# Patient Record
Sex: Female | Born: 1935
Health system: Southern US, Community
[De-identification: ages and names within clinical notes are randomized; demographics above are authoritative.]

## PROBLEM LIST (undated history)

## (undated) DIAGNOSIS — E785 Hyperlipidemia, unspecified: Secondary | ICD-10-CM

## (undated) DIAGNOSIS — J310 Chronic rhinitis: Secondary | ICD-10-CM

## (undated) DIAGNOSIS — I10 Essential (primary) hypertension: Secondary | ICD-10-CM

## (undated) HISTORY — DX: Chronic rhinitis: J31.0

## (undated) HISTORY — PX: NASAL SINUS SURGERY: SHX719

## (undated) HISTORY — DX: Essential (primary) hypertension: I10

## (undated) HISTORY — PX: MANDIBLE SURGERY: SHX707

## (undated) HISTORY — DX: Hyperlipidemia, unspecified: E78.5

---

## 1998-05-04 ENCOUNTER — Ambulatory Visit (HOSPITAL_COMMUNITY): Admission: RE | Admit: 1998-05-04 | Discharge: 1998-05-04 | Payer: Self-pay | Admitting: Gastroenterology

## 1998-05-25 ENCOUNTER — Other Ambulatory Visit: Admission: RE | Admit: 1998-05-25 | Discharge: 1998-05-25 | Payer: Self-pay | Admitting: *Deleted

## 1998-12-29 ENCOUNTER — Other Ambulatory Visit: Admission: RE | Admit: 1998-12-29 | Discharge: 1998-12-29 | Payer: Self-pay | Admitting: Internal Medicine

## 2000-01-18 ENCOUNTER — Other Ambulatory Visit: Admission: RE | Admit: 2000-01-18 | Discharge: 2000-01-18 | Payer: Self-pay | Admitting: Internal Medicine

## 2001-01-23 ENCOUNTER — Other Ambulatory Visit: Admission: RE | Admit: 2001-01-23 | Discharge: 2001-01-23 | Payer: Self-pay | Admitting: *Deleted

## 2003-11-19 HISTORY — PX: VESICOVAGINAL FISTULA CLOSURE W/ TAH: SUR271

## 2005-04-14 ENCOUNTER — Encounter: Admission: RE | Admit: 2005-04-14 | Discharge: 2005-04-14 | Payer: Self-pay | Admitting: Orthopedic Surgery

## 2005-10-15 ENCOUNTER — Other Ambulatory Visit: Admission: RE | Admit: 2005-10-15 | Discharge: 2005-10-15 | Payer: Self-pay | Admitting: Internal Medicine

## 2008-09-08 ENCOUNTER — Ambulatory Visit: Payer: Self-pay | Admitting: Internal Medicine

## 2009-02-20 ENCOUNTER — Ambulatory Visit: Payer: Self-pay | Admitting: Internal Medicine

## 2009-08-25 ENCOUNTER — Ambulatory Visit: Payer: Self-pay | Admitting: Internal Medicine

## 2010-02-06 ENCOUNTER — Ambulatory Visit: Payer: Self-pay | Admitting: Internal Medicine

## 2010-02-06 DIAGNOSIS — I10 Essential (primary) hypertension: Secondary | ICD-10-CM | POA: Insufficient documentation

## 2010-02-06 DIAGNOSIS — E785 Hyperlipidemia, unspecified: Secondary | ICD-10-CM | POA: Insufficient documentation

## 2010-02-06 DIAGNOSIS — J310 Chronic rhinitis: Secondary | ICD-10-CM | POA: Insufficient documentation

## 2010-02-06 LAB — CONVERTED CEMR LAB
Basophils Absolute: 0 10*3/uL (ref 0.0–0.1)
Eosinophils Absolute: 0.6 10*3/uL (ref 0.0–0.7)
Eosinophils Relative: 9.4 % — ABNORMAL HIGH (ref 0.0–5.0)
Hemoglobin: 12.1 g/dL (ref 12.0–15.0)
MCV: 95 fL (ref 78.0–100.0)
Monocytes Absolute: 0.8 10*3/uL (ref 0.1–1.0)
Neutro Abs: 2.6 10*3/uL (ref 1.4–7.7)
Platelets: 282 10*3/uL (ref 150.0–400.0)
RBC: 3.81 M/uL — ABNORMAL LOW (ref 3.87–5.11)

## 2010-02-21 ENCOUNTER — Ambulatory Visit: Payer: Self-pay | Admitting: Internal Medicine

## 2010-02-22 ENCOUNTER — Telehealth (INDEPENDENT_AMBULATORY_CARE_PROVIDER_SITE_OTHER): Payer: Self-pay | Admitting: *Deleted

## 2010-02-23 ENCOUNTER — Ambulatory Visit: Payer: Self-pay | Admitting: Internal Medicine

## 2010-03-29 ENCOUNTER — Ambulatory Visit: Payer: Self-pay | Admitting: Internal Medicine

## 2010-04-02 ENCOUNTER — Ambulatory Visit: Payer: Self-pay | Admitting: Cardiovascular Disease

## 2010-04-10 ENCOUNTER — Telehealth (INDEPENDENT_AMBULATORY_CARE_PROVIDER_SITE_OTHER): Payer: Self-pay | Admitting: *Deleted

## 2010-05-08 ENCOUNTER — Ambulatory Visit: Payer: Self-pay | Admitting: Internal Medicine

## 2010-05-13 ENCOUNTER — Encounter: Payer: Self-pay | Admitting: Internal Medicine

## 2010-05-14 ENCOUNTER — Ambulatory Visit: Payer: Self-pay | Admitting: Internal Medicine

## 2010-05-22 ENCOUNTER — Ambulatory Visit: Payer: Self-pay | Admitting: Internal Medicine

## 2010-05-25 ENCOUNTER — Ambulatory Visit: Payer: Self-pay | Admitting: Internal Medicine

## 2010-05-28 ENCOUNTER — Ambulatory Visit: Payer: Self-pay | Admitting: Internal Medicine

## 2010-05-31 ENCOUNTER — Ambulatory Visit: Payer: Self-pay | Admitting: Internal Medicine

## 2010-06-05 ENCOUNTER — Ambulatory Visit: Payer: Self-pay | Admitting: Internal Medicine

## 2010-06-08 ENCOUNTER — Ambulatory Visit: Payer: Self-pay | Admitting: Internal Medicine

## 2010-06-18 ENCOUNTER — Ambulatory Visit: Payer: Self-pay | Admitting: Internal Medicine

## 2010-06-21 ENCOUNTER — Ambulatory Visit: Payer: Self-pay | Admitting: Internal Medicine

## 2010-06-22 ENCOUNTER — Ambulatory Visit: Payer: Self-pay | Admitting: Internal Medicine

## 2010-06-26 ENCOUNTER — Ambulatory Visit: Payer: Self-pay | Admitting: Internal Medicine

## 2010-06-29 ENCOUNTER — Ambulatory Visit: Payer: Self-pay | Admitting: Internal Medicine

## 2010-07-03 ENCOUNTER — Ambulatory Visit: Payer: Self-pay | Admitting: Internal Medicine

## 2010-07-06 ENCOUNTER — Ambulatory Visit: Payer: Self-pay | Admitting: Internal Medicine

## 2010-07-09 ENCOUNTER — Ambulatory Visit: Payer: Self-pay | Admitting: Internal Medicine

## 2010-07-11 ENCOUNTER — Ambulatory Visit: Payer: Self-pay | Admitting: Internal Medicine

## 2010-07-16 ENCOUNTER — Ambulatory Visit: Payer: Self-pay | Admitting: Internal Medicine

## 2010-07-17 ENCOUNTER — Ambulatory Visit: Payer: Self-pay | Admitting: Internal Medicine

## 2010-07-19 ENCOUNTER — Ambulatory Visit: Payer: Self-pay | Admitting: Internal Medicine

## 2010-07-24 ENCOUNTER — Ambulatory Visit: Payer: Self-pay | Admitting: Internal Medicine

## 2010-08-02 ENCOUNTER — Ambulatory Visit: Payer: Self-pay | Admitting: Internal Medicine

## 2010-08-06 ENCOUNTER — Ambulatory Visit: Payer: Self-pay | Admitting: Internal Medicine

## 2010-08-09 ENCOUNTER — Ambulatory Visit: Payer: Self-pay | Admitting: Internal Medicine

## 2010-08-14 ENCOUNTER — Ambulatory Visit: Payer: Self-pay | Admitting: Internal Medicine

## 2010-08-17 ENCOUNTER — Ambulatory Visit: Payer: Self-pay | Admitting: Internal Medicine

## 2010-08-21 ENCOUNTER — Ambulatory Visit: Payer: Self-pay | Admitting: Internal Medicine

## 2010-08-24 ENCOUNTER — Ambulatory Visit: Payer: Self-pay | Admitting: Internal Medicine

## 2010-08-27 ENCOUNTER — Ambulatory Visit: Payer: Self-pay | Admitting: Internal Medicine

## 2010-08-28 ENCOUNTER — Ambulatory Visit: Payer: Self-pay | Admitting: Internal Medicine

## 2010-09-04 ENCOUNTER — Ambulatory Visit: Payer: Self-pay | Admitting: Internal Medicine

## 2010-09-07 ENCOUNTER — Ambulatory Visit: Payer: Self-pay | Admitting: Internal Medicine

## 2010-09-10 ENCOUNTER — Ambulatory Visit: Payer: Self-pay | Admitting: Internal Medicine

## 2010-09-13 ENCOUNTER — Ambulatory Visit: Payer: Self-pay | Admitting: Internal Medicine

## 2010-09-17 ENCOUNTER — Ambulatory Visit: Payer: Self-pay | Admitting: Internal Medicine

## 2010-09-21 ENCOUNTER — Ambulatory Visit: Payer: Self-pay | Admitting: Internal Medicine

## 2010-09-27 ENCOUNTER — Ambulatory Visit: Payer: Self-pay | Admitting: Internal Medicine

## 2010-10-02 ENCOUNTER — Ambulatory Visit: Payer: Self-pay | Admitting: Internal Medicine

## 2010-10-09 ENCOUNTER — Ambulatory Visit: Payer: Self-pay | Admitting: Internal Medicine

## 2010-10-18 ENCOUNTER — Ambulatory Visit: Payer: Self-pay | Admitting: Internal Medicine

## 2010-10-25 ENCOUNTER — Ambulatory Visit: Payer: Self-pay | Admitting: Internal Medicine

## 2010-10-30 ENCOUNTER — Ambulatory Visit: Payer: Self-pay | Admitting: Internal Medicine

## 2010-10-30 ENCOUNTER — Encounter: Payer: Self-pay | Admitting: Internal Medicine

## 2010-11-13 ENCOUNTER — Ambulatory Visit: Payer: Self-pay | Admitting: Internal Medicine

## 2010-11-16 ENCOUNTER — Ambulatory Visit: Payer: Self-pay | Admitting: Internal Medicine

## 2010-12-03 ENCOUNTER — Ambulatory Visit: Payer: Self-pay | Admitting: Internal Medicine

## 2010-12-06 ENCOUNTER — Ambulatory Visit: Payer: Self-pay | Admitting: Internal Medicine

## 2010-12-11 ENCOUNTER — Ambulatory Visit: Payer: Self-pay | Admitting: Internal Medicine

## 2010-12-13 ENCOUNTER — Ambulatory Visit: Payer: Self-pay | Admitting: Internal Medicine

## 2010-12-18 ENCOUNTER — Ambulatory Visit: Payer: Self-pay | Admitting: Internal Medicine

## 2010-12-18 NOTE — Progress Notes (Signed)
Summary: results  Phone Note Call from Patient Call back at [phone] or [phone]   Caller: Patient Call For: young Summary of Call: calling for ct results Initial call taken by: Rickard Patience,  Apr 10, 2010 9:02 AM  Follow-up for Phone Call        called and spoke with pt.  pt aware of CT results.  pt states she is already using neti pot but will also pick up mucinex as well.  pt requested [facility] [place], [place] as she is at [employer] this week.  RX sent to pharmacy.  Megan Reynolds LPN  Apr 10, 2010 9:08 AM     New/Updated Medications: BIAXIN 500 MG TABS (CLARITHROMYCIN) Take 1 tablet by mouth two times a day for 10 days Prescriptions: BIAXIN 500 MG TABS (CLARITHROMYCIN) Take 1 tablet by mouth two times a day for 10 days  #20 x 0   Entered by:   Arman Filter LPN   Authorized by:   Waymon Budge MD   Signed by:   Arman Filter LPN on 84/69/6295   Method used:   Telephoned to ...       [facility] [address] (retail)       [address]       [place], [place]  [address]       Ph: [phone]       Fax: [phone]   RxID:   [id number]  Rx was not called into [facility] in [place].  [facility] [place], [place] [phone] Arman Filter LPN  Apr 10, 2010 9:22 AM

## 2010-12-18 NOTE — Miscellaneous (Signed)
Summary: Skin Test/[facility]  Skin Test/[facility]   Imported By: Sherian Rein 02/28/2010 10:15:29  _____________________________________________________________________  External Attachment:    Type:   Image     Comment:   External Document

## 2010-12-18 NOTE — Miscellaneous (Signed)
Summary: Injection Record / [facility]    Injection Record / [facility]    Imported By: Lennie Odor 09/25/2010 14:18:23  _____________________________________________________________________  External Attachment:    Type:   Image     Comment:   External Document

## 2010-12-18 NOTE — Assessment & Plan Note (Signed)
Summary: rov 1 month ///kp   Copy to:  Dr. Sandrea Hughs Primary Provider/Referring Provider:  Dr. Lenord Fellers  CC:  1 month follow up visit-allergies.  History of Present Illness: 2010/03/03- 81 yoF referred by Dr Sherene Sires (who had taken care of her husband) for allergy assessment, complaining of chronic nasal congestion. She remembers allergy treatment by Dr Rema Jasmine in the 1960's, when she was on allergy vaccine. She thought treatment helped, but her next allergist didn't think she needed shots. She cites triggers including weather changes and strong odors, dust and mold, but says symptoms are perennial, worst in Spring and Fall. Typically, in the last 2 weeks she has had increased nasal congestion, frontal and retroorbital headache, morning cough with clear mucus. She has little sneeze, itch or wheeze. Last antibiotic was in Oct, 2010. Squeeze bottle saline has helped a lot. Flonase and antihistamines have helped some. After a CT sinus she was sent to an ENT for discussion of surgery which she doesn't want. Doesn't like nasal sprays. Up to date on Flu vax, Pneumovax.  February 21, 2010- Allergic rhinitis Aware of rain and pollens affecting her nasal congestion in the past week off antihistamines. She reports remote hx of CT sinus at [facility]- we will try to get that report. CBC- EOS increased 9.4% IgE- 101.5 RAST- Neg Skin test- grass, weed, tree, endodermals, molds  Mar 29, 2010- Allergic rhinitis She says she has been very uncomfortable with nasal and sinus congestion since here in March. We reviewed her allergy skin tests which correspond seasonally. She hasn't liked her experience with nasal sprays. She has been using Claritin-D. Feels blocked with little drainage. Also has occipital neck pain with movement. She compains of occipital pain from arching neck to breathe at night. Ear feels stopped up. She says now that previous CT sinus was in 1995. Goes to beach regularly in summer  and concerned how  that would work if she wanted to try allergy shots again.     Current Medications (verified): 1)  Claritin-D 12 Hour 5-120 Mg Xr12h-Tab (Loratadine-Pseudoephedrine) .... Once Daily 2)  Amlodipine Besylate 5 Mg Tabs (Amlodipine Besylate) .... Once Daily 3)  Simvastatin 10 Mg Tabs (Simvastatin) .... 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  Quit in 1980's.  < 1/2 ppd x 10 yrs  Risk Factors: Smoking Status: quit (Mar 03, 2010)  Review of Systems      See HPI       The patient complains of nasal congestion/difficulty breathing through nose.  The patient denies shortness of breath with activity, shortness of breath at rest, productive cough, non-productive cough, coughing up blood, chest pain, irregular heartbeats, acid heartburn, indigestion, loss of appetite, weight change, abdominal pain, difficulty swallowing, sore throat, tooth/dental problems, and headaches.    Vital Signs:  Patient profile:   77 year old female Height:      61 inches Weight:      120 pounds BMI:     22.76 O2 Sat:      95 % on Room air Pulse rate:   90 /  minute BP sitting:   122 / 74  (left arm) Cuff size:   regular  Vitals Entered By: Reynaldo Minium CMA (Mar 29, 2010 2:48 PM)  O2 Flow:  Room air  Physical Exam  Additional Exam:  General: A/Ox3; pleasant and cooperative, NAD, very sharp, elderly, slender SKIN: no rash, lesions NODES: no lymphadenopathy HEENT: [place]/AT,  EOM- WNL, Conjuctivae- clear, periorbital puffiness, PERRLA, TM-WNL, Nose- clear, somewhat atrophic, Throat- clear and wnl, Mallampati  II NECK: Supple w/ fair ROM, JVD- none, normal carotid impulses w/o bruits Thyroid-  CHEST: Clear to P&A HEART: RRR, no m/g/r heard ABDOMEN: Soft and nl;  IHK:VQQV, nl pulses, no edema  NEURO: Grossly intact to observation      Impression & Recommendations:  Problem # 1:  RHINITIS, CHRONIC (ICD-472.0)  Perennial nasal congestion at night. We will CT sinuses since her last one was 11 years ago. She doesn't remember now that surgery was ever a consideration. We will give neb and depo today so that she can see if there is any response to medication. Her occipital headaches may come from arching her neck at night trying to breathe through her nose.  Other Orders: Est. Patient Level III (95638) Radiology Referral (Radiology) Admin of Therapeutic Inj  intramuscular or subcutaneous (75643) Depo- Medrol 80mg  (J1040) Nebulizer Tx (32951)  Patient Instructions: 1)  Please schedule a follow-up appointment in 1 month. 2)  Neb neo nasal  3)  Depo 80 4)  Let's see what effect this treatment today has, and how you feel as we get through the last of the Spring pollen season. 5)  That and the results of the CT scan can help [place] decide if medication or allergy shots can offer anything beyond what you are already doing. 6)  A Limited Sinus CT has been recommended.  Your imaging study may require preauthorization.      Medication Administration  Injection # 1:    Medication: Depo- Medrol 80mg     Diagnosis: RHINITIS, CHRONIC (ICD-472.0)    Route: SQ    Site: RUOQ gluteus    Exp Date: 09/2012    Lot #: [id number]    Mfr: Pharmacia    Patient tolerated injection without complications    Given by: Reynaldo Minium CMA (Mar 29, 2010 5:22 PM)  Medication # 1:    Medication: EMR miscellaneous medications    Diagnosis: RHINITIS, CHRONIC (ICD-472.0)    Dose: 3 drops     Route: intranasal    Exp Date: 08/2011    Lot #: [id number]    Mfr: bayer    Comments: Neo-Synephrine.    Patient tolerated medication without complications    Given by: Reynaldo Minium CMA (Mar 29, 2010 5:22 PM)  Orders Added: 1)  Est. Patient Level III [63016] 2)  Radiology Referral [Radiology] 3)  Admin of Therapeutic Inj  intramuscular or subcutaneous [96372] 4)  Depo- Medrol 80mg  [J1040] 5)  Nebulizer Tx [01093]

## 2010-12-18 NOTE — Miscellaneous (Signed)
Summary: Injection Record / [facility]    Injection Record / [facility]    Imported By: Lennie Odor 07/20/2010 11:37:40  _____________________________________________________________________  External Attachment:    Type:   Image     Comment:   External Document

## 2010-12-18 NOTE — Progress Notes (Signed)
Summary: copy  Phone Note Call from Patient   Caller: Patient Call For: young Summary of Call: pt wants a copy of her allergy skin test sent to her. Initial call taken by: Eugene Gavia,  February 22, 2010 8:33 AM  Follow-up for Phone Call        Please inform pt that once the note has been signed off on and results are available i will mail her a copy. I have flagged myself as a reminder to do so. thanks. Reynaldo Minium CMA  February 22, 2010 2:56 PM   Additional Follow-up for Phone Call Additional follow up Details #1::        Spoke with pt and made aware of the above recs per [facility]. Additional Follow-up by: Vernie Murders,  February 22, 2010 2:58 PM

## 2010-12-18 NOTE — Miscellaneous (Signed)
Summary: Injection Orders / [facility]    Injection Orders /  Allergy    Imported By: Lennie Odor 05/22/2010 11:09:20  _____________________________________________________________________  External Attachment:    Type:   Image     Comment:   External Document

## 2010-12-18 NOTE — Miscellaneous (Signed)
Summary: Injection [facility]   Imported By: Sherian Rein 10/10/2010 14:17:12  _____________________________________________________________________  External Attachment:    Type:   Image     Comment:   External Document

## 2010-12-18 NOTE — Assessment & Plan Note (Signed)
Summary: rov 2 months///kp   Copy to:  Dr. Sandrea Hughs Primary Provider/Referring Provider:  Dr. Lenord Fellers  CC:  2 month follow up visit-allergies.C/O congestion(nasal), headaches, stiffness in neck, blood from nose, and Right ear stopped up.Marland Kitchen  History of Present Illness: Mar 29, 2010- Allergic rhinitis She says she has been very uncomfortable with nasal and sinus congestion since here in March. We reviewed her allergy skin tests which correspond seasonally. She hasn't liked her experience with nasal sprays. She has been using Claritin-D. Feels blocked with little drainage. Also has occipital neck pain with movement. She compains of occipital pain from arching neck to breathe at night. Ear feels stopped up. She says now that previous CT sinus was in 1995. Goes to beach regularly in summer  and concerned how that would work if she wanted to try allergy shots again.  May 08, 2010- Allergic rhinitis, rhinosinusitis, pruritus After last here, CT sinus showed bubbly opacification c/w acute sinusitis and we had her take biaxin x 10 days with mucinex and Neti pot. She feels much better since then- essentially well now. Denies headache, blowing or draining now. Still stops up in nose lying down at night. Chest is comfortable.  New problem of patchy pruritic rash, mostly not visible, x 1 week We discussed possile timing related to biaxin.She asks about starting alergy vaccine based on skin testing 02/21/10.  July 11, 2010- Allergic rhinitis, rhinosinusitis, pruitus Getting headaches at back of neck/ occipital. Trying heat for these. Building allergy vacine here.  Right ear stopped up. Nasal congestion persists. Small blood from nose in mucus. Using Clarinex-d but c/o not helping. Uses Neti pot.   Preventive Screening-Counseling & Management  Alcohol-Tobacco     Smoking Status: quit > 6 months     Year Started: 1979     Year Quit: 1984     Tobacco Counseling: not indicated; no tobacco use  Current  Medications (verified): 1)  Claritin-D 12 Hour 5-120 Mg Xr12h-Tab (Loratadine-Pseudoephedrine) .... Once Daily 2)  Amlodipine Besylate 5 Mg Tabs (Amlodipine Besylate) .... Once Daily 3)  Simvastatin 10 Mg Tabs (Simvastatin) .... 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  Quit in 1980's.  < 1/2 ppd x 10 yrs  Risk Factors: Smoking Status: quit > 6 months (07/11/2010)  Review of Systems      See HPI       The patient complains of headaches, nasal congestion/difficulty breathing through nose, and ear ache.  The patient denies shortness of breath with activity, shortness of breath at rest, productive cough, non-productive cough, coughing up blood, chest pain, irregular heartbeats, acid heartburn, indigestion, loss of appetite, weight change, abdominal pain, difficulty swallowing, sore throat, tooth/dental problems, sneezing, rash, change in color of mucus, and fever.    Vital Signs:  Patient profile:   77 year old female Height:      61 inches Weight:      116.13 pounds BMI:     22.02 O2 Sat:      97 % on Room air Pulse rate:  78 / minute BP sitting:   132 / 74  (left arm) Cuff size:   regular  Vitals Entered By: Reynaldo Minium CMA (July 11, 2010 11:03 AM)  O2 Flow:  Room air CC: 2 month follow up visit-allergies.C/O congestion(nasal),headaches,stiffness in neck, blood from nose, Right ear stopped up.   Physical  Exam  Additional Exam:  General: A/Ox3; pleasant and cooperative, NAD, very sharp, elderly, slender SKIN: no rash, lesions NODES: no lymphadenopathy HEENT: [place]/AT, EOM- WNL, Conjuctivae- clear, mild periorbital puffiness, PERRLA, TM-WNL.Marland Kitchen Specifically the right side is normal or very minimally retracted, Nose- clear, narrow. Mucosa is not inflammed or edematous.,  mucosa somewhat atrophic, Throat- clear and wnl, Mallampati  II NECK: Supple w/ fair ROM, JVD- none, normal carotid impulses w/o bruits Thyroid-  CHEST: Clear to P&A HEART: RRR, no m/g/r heard ABDOMEN: Soft and nl;  ZOX:WRUE, nl pulses, no edema  NEURO: Grossly intact to observation      Impression & Recommendations:  Problem # 1:  HEADACHE, CHRONIC (ICD-784.0)  She is describing occipital headache most consitent with muscle tension and cervical arthritis. I explained this is not likely related to her nasal/ sinus complaints.  Problem # 2:  RHINITIS, CHRONIC (ICD-472.0)  She attributes current symptoms to the hot humid summer. That air quality problem is beginning to ease. She will continue building allergy vaccine. We will try mucinex-D instead of antihistamines, and try Nasalcrom nasal spray.  Medications Added to Medication List This Visit: 1)  Allergy Vaccine Building Gh  2)  Nasalcrom 5.2 Mg/act Aers (Cromolyn sodium) .Marland Kitchen.. 1-2 sprays each nostril four times a day 3)  Mucinex D 60-600 Mg Xr12h-tab (Pseudoephedrine-guaifenesin) .Marland Kitchen.. 1 two times a day as needed  Other Orders: Est. Patient Level III (45409)  Patient Instructions: 1)  Please schedule a follow-up appointment in 4 months. 2)  Continue building allergy vaccine. We will probably increase the strength next office visit. 3)  Try otc Mucinex-D 60/600, 1-2 twice daily if needed for your nose. 4)  Try otc nasalcrom. cromolyn nasal spray.

## 2010-12-18 NOTE — Assessment & Plan Note (Signed)
Summary: ROV 1 MONTH///KP   Copy to:  Dr. Sandrea Hughs Primary Provider/Referring Provider:  Dr. Lenord Fellers  CC:  1 month followup- rhinitis.  History of Present Illness: February 21, 2010- Allergic rhinitis Aware of rain and pollens affecting her nasal congestion in the past week off antihistamines. She reports remote hx of CT sinus at [facility]- we will try to get that report. CBC- EOS increased 9.4% IgE- 101.5 RAST- Neg Skin test- grass, weed, tree, endodermals, molds  Mar 29, 2010- Allergic rhinitis She says she has been very uncomfortable with nasal and sinus congestion since here in March. We reviewed her allergy skin tests which correspond seasonally. She hasn't liked her experience with nasal sprays. She has been using Claritin-D. Feels blocked with little drainage. Also has occipital neck pain with movement. She compains of occipital pain from arching neck to breathe at night. Ear feels stopped up. She says now that previous CT sinus was in 1995. Goes to beach regularly in summer  and concerned how that would work if she wanted to try allergy shots again.  May 08, 2010- Allergic rhinitis, rhinosinusitis, pruritus After last here, CT sinus showed bubbly opacification c/w acute sinusitis and we had her take biaxin x 10 days with mucinex and Neti pot. She feels much better since then- essentially well now. Denies headache, blowing or draining now. Still stops up in nose lying down at night. Chest is comfortable.  New problem of patchy pruritic rash, mostly not visible, x 1 week We discussed possile timing related to biaxin.She asks about starting alergy vaccine based on skin testing 02/21/10.     Preventive Screening-Counseling & Management  Alcohol-Tobacco     Smoking Status: quit > 6 months  Current Medications (verified): 1)  Claritin-D 12 Hour 5-120 Mg Xr12h-Tab (Loratadine-Pseudoephedrine) .... Once Daily 2)  Amlodipine Besylate 5 Mg Tabs (Amlodipine Besylate) .... Once Daily 3)   Simvastatin 10 Mg Tabs (Simvastatin) .... 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  Quit in 1980's.  < 1/2 ppd x 10 yrs  Risk Factors: Smoking Status: quit > 6 months (05/08/2010)  Social History: Smoking Status:  quit > 6 months  Review of Systems      See HPI       The patient complains of nasal congestion/difficulty breathing through nose and sneezing.  The patient denies shortness of breath with activity, shortness of breath at rest, productive cough, non-productive cough, coughing up blood, chest pain, irregular heartbeats, acid heartburn, indigestion, loss of appetite, weight change, abdominal pain, difficulty swallowing, sore throat, tooth/dental problems, and headaches.    Vital Signs:  Patient profile:   77 year old female Height:      61 inches Weight:      119 pounds BMI:     22.57 O2 Sat:      95 % on Room air Pulse rate:   94 / minute BP sitting:   110 / 64  (left arm) Cuff size:   regular  Vitals Entered By: Kandice Hams (May 08, 2010 2:53 PM)  O2 Flow:  Room air  Physical Exam  Additional Exam:  General: A/Ox3; pleasant and cooperative, NAD, very sharp, elderly, slender SKIN: no rash, lesions NODES: no lymphadenopathy HEENT: [place]/AT, EOM- WNL, Conjuctivae- clear, mild periorbital puffiness, PERRLA, TM-WNL, Nose- clear,  mucosa somewhat atrophic, Throat- clear and wnl, Mallampati  II NECK: Supple w/ fair ROM, JVD- none, normal  carotid impulses w/o bruits Thyroid-  CHEST: Clear to P&A HEART: RRR, no m/g/r heard ABDOMEN: Soft and nl;  AVW:UJWJ, nl pulses, no edema  NEURO: Grossly intact to observation      CT of Sinus  Procedure date:  04/02/2010  Findings:      CT SINUS LTD W/O CM - [id number]   Clinical Data: 77 year old female with frontal and maxillary sinus pain.  Congestion.   CT PARANASAL SINUS LIMITED WITHOUT CONTRAST   Technique:  Multidetector CT images of the paranasal sinuses were obtained in a single plane without contrast.   Comparison: The brain MRI 04/06/2005.   Findings: Brain parenchyma not well evaluated with this technique. Orbit and facial soft tissues are grossly normal.  Scattered paranasal sinus opacification right greater than left, with opacity most pronounced in the right frontal sinus.  There is dependent low density mucosal thickening in the right maxillary sinus.  The left maxillary is clear.  There is bubbly opacity in the posterior ethmoids.  The sphenoid sinuses are largely spared.  Visualized tympanic cavities are clear. No acute osseous abnormality identified.   IMPRESSION: Right greater than left paranasal sinus opacification with bubbly opacity is compatible with acute sinusitis in the appropriate clinical setting.   Read By:  Augusto Gamble,  M.D.     Released By:  Augusto Gamble,  M.D.  _______  Impression & Recommendations:  Problem # 1:  RHINITIS, CHRONIC (ICD-472.0)  There has been an underlying atopic and possibly vasomotor rhinitis. We again looked at seasonal patterns, her past experience and treatment options. She would like to go on allergy vaccine, which we have discussed. We will work around her frequent beach trips. We reviewed risks up to and including anaphyllaxis, realistic potential for benefit, especialy respecting her age, and symptomatic options. We also discussed sinusitis vs allergy.  Problem # 2:  RHINOSINUSITIS, ACUTE (ICD-461.8)  She  has mostly resolved a secondary sinusitis complicating her allergic rhinitis. She improved after antibiotic and after clearing of pollen season. We are going to extend antibioitic a bit longer for complettion of therapy. We talked about extending the biaxin or changing. She wants to stay with the biaxin, so that if rash starts again she will know what to avoid. The following medications were removed from the medication list:    Biaxin 500 Mg Tabs (Clarithromycin) .Marland Kitchen... Take 1 tablet by mouth two times a day for 10 days Her updated medication list for this problem includes:    Claritin-d 12 Hour 5-120 Mg Xr12h-tab (Loratadine-pseudoephedrine) ..... Once daily    Clarithromycin 500 Mg Tabs (Clarithromycin) .Marland Kitchen... 1 two times a day  Problem # 3:  RHINOSINUSITIS, ACUTE (ICD-461.8)  Medications Added to Medication List This Visit: 1)  Clarithromycin 500 Mg Tabs (Clarithromycin) .Marland Kitchen.. 1 two times a day 2)  Allergy Vaccine New Start   Other Orders: Est. Patient Level IV (56213)  Patient Instructions: 1)  Please schedule a follow-up appointment in 2 months. 2)  I will have the allergy lab call you when your allergy vaccine is ready , so you can come in to get started. 3)  Script to extend antibiotic was sent to your drug store.  4)  if you start itching agin, then stop the biaxin. Prescriptions: CLARITHROMYCIN 500 MG TABS (CLARITHROMYCIN) 1 two times a day  #14 x 0   Entered and Authorized by:   Waymon Budge MD   Signed by:   Waymon Budge MD on 05/08/2010   Method used:   Electronically to        [facility] (retail)       [address]       [place], [place]  [address]       Ph: [phone]       Fax: [phone]   RxID:   [id number]    CT of Sinus  Procedure date:  04/02/2010  Findings:      CT SINUS LTD W/O CM - [id number]   Clinical Data: 77 year old female with frontal and maxillary sinus pain.  Congestion.   CT PARANASAL SINUS  LIMITED WITHOUT CONTRAST   Technique:  Multidetector CT images of the paranasal sinuses were obtained in a single plane without contrast.   Comparison: The brain MRI 04/06/2005.   Findings: Brain parenchyma not well evaluated with this technique. Orbit and facial soft tissues are grossly normal.  Scattered paranasal sinus opacification right greater than left, with opacity most pronounced in the right frontal sinus.  There is dependent low density mucosal thickening in the right maxillary sinus.  The left maxillary is clear.  There is bubbly opacity in the posterior ethmoids.  The sphenoid sinuses are largely spared.  Visualized tympanic cavities are clear. No acute osseous abnormality identified.   IMPRESSION: Right greater than left paranasal sinus opacification with bubbly opacity is compatible with acute sinusitis in the appropriate clinical setting.   Read By:  Augusto Gamble,  M.D.     Released By:  Augusto Gamble,  M.D.  _______

## 2010-12-18 NOTE — Assessment & Plan Note (Signed)
Summary: ALLERGIES///kp   Vital Signs:  Patient profile:   77 year old female Height:      61 inches Weight:      118.13 pounds BMI:     22.40 O2 Sat:      96 % on Room air Pulse rate:   89 / minute BP sitting:   128 / 76  (left arm) Cuff size:   regular  Vitals Entered By: Gweneth Dimitri RN (02/11/10 2:02 PM)  O2 Flow:  Room air CC: Allergy Consult. Comments Medications reviewed with patient Daytime contact number verified with patient. Gweneth Dimitri RN  02-11-2010 2:08 PM    Copy to:  Dr. Sandrea Hughs Primary Provider/Referring Provider:  Dr. Lenord Fellers  CC:  Allergy Consult.Marland Kitchen  History of Present Illness: 11-Feb-2010- 81 yoF referred by Dr Sherene Sires (who had taken care of her husband) for allergy assessment, complaining of chronic nasal congestion. She remembers allergy treatment by Dr Rema Jasmine in the 1960's, when she was on allergy vaccine. She thought treatment helped, but her next allergist didn't think she needed shots. She cites triggers including weather changes and strong odors, dust and mold, but says symptoms are perennial, worst in Spring and Fall. Typically, in the last 2 weeks she has had increased nasal congestion, frontal and retroorbital headache, morning cough with clear mucus. She has little sneeze, itch or wheeze. Last antibiotic was in Oct, 2010. Squeeze bottle saline has helped a lot. Flonase and antihistamines have helped some. After a CT sinus she was sent to an ENT for discussion of surgery which she doesn't want. Doesn't like nasal sprays. Up to date on Flu vax, Pneumovax.  Preventive Screening-Counseling & Management  Alcohol-Tobacco     Smoking Status: quit  Current Medications (verified): 1)  Claritin-D 12 Hour 5-120 Mg Xr12h-Tab (Loratadine-Pseudoephedrine) .... Once Daily 2)  Amlodipine Besylate 5 Mg Tabs (Amlodipine Besylate) .... Once Daily 3)  Simvastatin 10 Mg Tabs (Simvastatin) .... 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  Quit in 1980's.  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  Quit in 1980's.  < 1/2 ppd x 10 yrs  Smoking Status:  quit  Review of Systems       The patient complains of headaches, nasal congestion/difficulty breathing through nose, and change in color of mucus.  The patient denies shortness of breath with activity, shortness of breath at rest, productive cough, non-productive cough, coughing up blood, chest pain, irregular heartbeats, acid heartburn, indigestion, loss of appetite, weight change, abdominal pain, difficulty swallowing, sore throat, tooth/dental problems, sneezing, itching, ear ache, anxiety, depression, hand/feet swelling, joint stiffness or pain, rash, and fever.    Physical Exam  Additional Exam:  General: A/Ox3; pleasant and cooperative, NAD, very sharp, elderly, slender SKIN: no rash, lesions NODES: no lymphadenopathy HEENT: [place]/AT, EOM- WNL, Conjuctivae- clear, PERRLA, TM-WNL, Nose- clear, Throat- clear and wnl, Mallampati  II  NECK: Supple w/ fair ROM, JVD- none, normal carotid impulses w/o bruits Thyroid- normal to palpation CHEST: Clear to P&A HEART: RRR, no m/g/r heard ABDOMEN: Soft and nl;  JXB:JYNW, nl pulses, no edema  NEURO: Grossly intact to observation       Impression & Recommendations:  Problem # 1:  RHINITIS, CHRONIC (ICD-472.0)  She is describing an old pattern of seasonal allergic rhinitis, with subsequent development of a chronic sinusitis radiologically. Her last CT was at an ENT office and may be due for repeat. She is interested in the allergy aspect and understands envirionmental precautions. We can try to assess how active an issue this is.   Medications Added to Medication List This Visit: 1)  Claritin-d 12 Hour 5-120 Mg Xr12h-tab (Loratadine-pseudoephedrine) .... Once daily 2)  Amlodipine Besylate 5 Mg Tabs (Amlodipine besylate) .... Once daily 3)  Simvastatin 10 Mg Tabs (Simvastatin) .... Once daily  Other Orders: New Patient Level IV (29562) TLB-CBC Platelet - w/Differential (85025-CBCD) T-Allergy Profile Region II-[place], [place], [place], [place], [place] [phone]) Depo- Medrol 80mg  (J1040) Admin of Therapeutic Inj  intramuscular or subcutaneous (65784) Nebulizer Tx (69629)  Patient Instructions: 1)  Return as able for allergy skin testing. - Stop all antihistamines 3 days before skin testing, including cold and allergy meds, otc sleep and cough meds. This includes your claritin D 2)  Lab  3)  Neb neo nasal 4)  depo 80   Immunization History:  Influenza Immunization History:    Influenza:  historical (08/18/2009)    Medication Administration  Injection # 1:    Medication: Depo- Medrol 80mg     Diagnosis: RHINITIS, CHRONIC (ICD-472.0)    Route: IM    Site: LUOQ gluteus    Exp Date: 09/2012    Lot #: [id number]    Mfr: Pharmacia    Patient tolerated injection without complications    Given by: Gweneth Dimitri RN (February 06, 2010 3:17 PM)  Medication # 1:    Medication: EMR miscellaneous medications    Diagnosis: RHINITIS, CHRONIC (ICD-472.0)    Dose: 3 gtts    Route: intranasal    Exp Date: 03/2011    Lot #: [id number]    Mfr: Bayer    Comments: NeoSynephrine    Patient tolerated medication without complications    Given by: Gweneth Dimitri RN (February 06, 2010 3:18 PM)  Orders Added: 1)  New Patient Level IV [99204] 2)  TLB-CBC Platelet - w/Differential [85025-CBCD] 3)  T-Allergy Profile Region [place], [place], [place], [place], [place] [2725] 4)  Depo- Medrol 80mg  [J1040] 5)  Admin of Therapeutic Inj  intramuscular or subcutaneous [96372] 6)  Nebulizer Tx [36644]

## 2010-12-18 NOTE — Assessment & Plan Note (Signed)
Summary: allergy skin testing ///kp   Vital Signs:  Patient profile:   77 year old female Height:      61 inches Weight:      116.50 pounds BMI:     22.09 O2 Sat:      93 % on Room air Pulse rate:   83 / minute BP sitting:   142 / 76  (right arm) Cuff size:   regular  Vitals Entered By: Boone Master CNA (February 21, 2010 3:47 PM)  O2 Flow:  Room air  Copy to:  Dr. Sandrea Hughs Primary Provider/Referring Provider:  Dr. Lenord Fellers  CC:  allergy testing.  History of Present Illness: Feb 16, 2010- 81 yoF referred by Dr Sherene Sires (who had taken care of her husband) for allergy assessment, complaining of chronic nasal congestion. She remembers allergy treatment by Dr Rema Jasmine in the 1960's, when she was on allergy vaccine. She thought treatment helped, but her next allergist didn't think she needed shots. She cites triggers including weather changes and strong odors, dust and mold, but says symptoms are perennial, worst in Spring and Fall. Typically, in the last 2 weeks she has had increased nasal congestion, frontal and retroorbital headache, morning cough with clear mucus. She has little sneeze, itch or wheeze. Last antibiotic was in Oct, 2010. Squeeze bottle saline has helped a lot. Flonase and antihistamines have helped some. After a CT sinus she was sent to an ENT for discussion of surgery which she doesn't want. Doesn't like nasal sprays. Up to date on Flu vax, Pneumovax.  February 21, 2010- Allergic rhinitis Aware of rain and pollens affecting her nasal congestion in the past week off antihistamines. She reports remote hx of CT sinus at [facility]- we will try to get that report. CBC- EOS increased 9.4% IgE- 101.5 RAST- Neg Skin test- grass, weed, tree, endodermals, molds   Current Medications (verified): 1)  Claritin-D 12 Hour 5-120 Mg Xr12h-Tab (Loratadine-Pseudoephedrine) .... Once Daily 2)  Amlodipine Besylate 5 Mg Tabs (Amlodipine Besylate) .... Once Daily 3)  Simvastatin 10 Mg Tabs  (Simvastatin) .... Once Daily  Allergies (verified): 1)  Amoxicillin  Comments:  Nurse/Medical Assistant: The patient's medications and allergies were reviewed with the patient and were updated in the Medication and Allergy Lists. 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  Quit in 1980's.  < 1/2 ppd x 10 yrs  Risk Factors: Smoking Status: quit (2010-02-16)  Past Medical History: HYPERLIPIDEMIA (ICD-272.4) HEADACHE, CHRONIC (ICD-784.0)- Skin test 02/21/10 Chronic rhinitis HYPERTENSION (ICD-401.9)  Review of Systems      See HPI  The patient denies anorexia, fever, weight loss, weight gain, vision loss, decreased hearing, hoarseness, chest pain, syncope, dyspnea on exertion, peripheral edema, prolonged cough, headaches, hemoptysis, and severe indigestion/heartburn.    Physical Exam  Additional Exam:  General: A/Ox3; pleasant and cooperative, NAD, very sharp, elderly, slender SKIN: no rash, lesions NODES: no lymphadenopathy HEENT: [place]/AT, EOM- WNL, Conjuctivae- clear, PERRLA, TM-WNL, Nose- clear, somewhat atrophic, Throat- clear and wnl, Mallampati  II NECK: Supple w/ fair ROM, JVD- none, normal carotid impulses w/o bruits Thyroid-  CHEST: Clear to P&A HEART: RRR, no m/g/r heard ABDOMEN: Soft and  nl;  ZOX:WRUE, nl pulses, no edema  NEURO: Grossly intact to observation      Impression & Recommendations:  Problem # 1:  RHINITIS, CHRONIC (ICD-472.0)  Her primary discomfort has been perennial nasal congestion. Mouthbreathing, turbinate edema and polyps are not seen on today's exam. Seasonal worsening is  significant most years. She declined ENT surgery in past after CT of sinuses, suggesting there may have been some chronic sinusitis. We will seek those.  She is already on claritin-d  We discussed allergy vaccine to the extent it helped years ago, and contrasted the expectations. We can let her try a sample of Astepro, offer to refer her back to an ENT. She doesn't want to go back to Dr Annalee Genta so if we can't get the old CT report, we can do a new one.  Problem # 2:  HEADACHE, CHRONIC (ICD-784.0)  I'm not sure that her headaches are directly related to her rhinitis , but may be.Response to treatment for rhinitis would be a good assessment.  Other Orders: Est. Patient Level II (45409) Allergy Puncture Test (81191) Allergy I.D Test (47829)  Patient Instructions: 1)  Please schedule a follow-up appointment in 1 month. 2)  Sample Astepro nasal antihistamine spray: 1-2 puffs each nostril up to twice daily as needed 3)  We will try to track down your old CT report

## 2010-12-19 DIAGNOSIS — J301 Allergic rhinitis due to pollen: Secondary | ICD-10-CM

## 2010-12-20 NOTE — Miscellaneous (Signed)
Summary: Vaccine Rx  Vaccine Rx   Imported By: [address] 11/30/2010 11:18:50  _____________________________________________________________________  External Attachment:    Type:   Image     Comment:   External Document

## 2010-12-20 NOTE — Assessment & Plan Note (Signed)
Summary: 4 months/apc   Copy to:  Dr. Sandrea Hughs Primary Farhad Burleson/Referring Kelis Plasse:  Dr. Lenord Fellers  CC:  Allergic rhinitis, rhinosinusitis, and pruitus.  History of Present Illness: May 08, 2010- Allergic rhinitis, rhinosinusitis, pruritus After last here, CT sinus showed bubbly opacification c/w acute sinusitis and we had her take biaxin x 10 days with mucinex and Neti pot. She feels much better since then- essentially well now. Denies headache, blowing or draining now. Still stops up in nose lying down at night. Chest is comfortable.  New problem of patchy pruritic rash, mostly not visible, x 1 week We discussed possile timing related to biaxin.She asks about starting alergy vaccine based on skin testing 02/21/10.  July 11, 2010- Allergic rhinitis, rhinosinusitis, pruitus Getting headaches at back of neck/ occipital. Trying heat for these. Building allergy vacine here.  Right ear stopped up. Nasal congestion persists. Small blood from nose in mucus. Using Clarinex-d but c/o not helping. Uses Neti pot.  October 30, 2010- Allergic rhinitis, rhinosinusitis, pruitus Nurse-CC: Allergic rhinitis, rhinosinusitis, pruitus Allergy vaccine is now at 1:50. She says the shots are helping- having fewer headaches. Denies reactions or concerns. Denies cough, wheeze, sneeze.  She continues otc Nasalcrom and nasal strips and daily saline rinse.    Preventive Screening-Counseling & Management  Alcohol-Tobacco     Smoking Status: quit > 6 months     Year Started: 1979     Year Quit: 1984     Tobacco Counseling: not indicated; no tobacco use  Current Medications (verified): 1)  Claritin-D 12 Hour 5-120 Mg Xr12h-Tab (Loratadine-Pseudoephedrine) .... Once Daily 2)  Amlodipine Besylate 5 Mg Tabs (Amlodipine Besylate) .... Once Daily 3)  Crestor 5 Mg Tabs (Rosuvastatin Calcium) .... Take 1 By Mouth Once Daily 4)  Allergy Vaccine Building Gh 5)  Nasalcrom 5.2 Mg/act Aers (Cromolyn Sodium) .Marland Kitchen.. 1-2  Sprays Each Nostril Four Times A Day 6)  Mucinex D 60-600 Mg Xr12h-Tab (Pseudoephedrine-Guaifenesin) .Marland Kitchen.. 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  Quit in 1980's.  < 1/2 ppd x 10 yrs  Risk Factors: Smoking Status: quit > 6 months (10/30/2010)  Review of Systems      See HPI  The patient denies shortness of breath with activity, shortness of breath at rest, productive cough, non-productive cough, coughing up blood, chest pain, irregular heartbeats, acid heartburn, indigestion, loss of appetite, weight change, abdominal pain, difficulty swallowing, sore throat, tooth/dental problems, headaches, nasal congestion/difficulty breathing through nose, and sneezing.    Vital Signs:  Patient profile:   77 year old female Height:      61 inches Weight:      115.25 pounds BMI:     21.86 O2 Sat:      95 % on Room air Pulse rate:   92 / minute BP sitting:   140 / 80  (left arm) Cuff size:   regular  Vitals Entered By: Reynaldo Minium CMA (October 30, 2010 10:23 AM)  O2 Flow:  Room air CC: Allergic rhinitis, rhinosinusitis, pruitus   Physical Exam  Additional Exam:  General: A/Ox3; pleasant and cooperative, NAD, very sharp, elderly, slender SKIN: no rash, lesions NODES: no lymphadenopathy HEENT: Burna/AT, EOM- WNL, Conjuctivae- clear, mild periorbital puffiness, PERRLA, TM-WNL, Nose- clear, narrow. Mucosa is not inflammed or edematous.,   mucosa somewhat atrophic, Throat- clear and wnl, Mallampati  II NECK: Supple w/ fair ROM, JVD- none, normal carotid impulses w/o bruits Thyroid-  CHEST: Clear to P&A HEART: RRR, no m/g/r heard ABDOMEN: Soft and nl;  ZOX:WRUE, nl pulses, no edema  NEURO: Grossly intact to observation      Impression & Recommendations:  Problem # 1:  RHINITIS, CHRONIC (ICD-472.0)  She is doing well with allergy vaccine. We have discussed and will advance to 1:10 with her next order.  She is tired of feeling stuffy despite decongestants, and is interested in talking to an ENT surgeon.   Problem # 2:  HEADACHE, CHRONIC (ICD-784.0)  She relates success with allergy vaccine to reduction in headaches, suggesting there may be a nasal congestion component. She seems pleased.   Medications Added to Medication List This Visit: 1)  Crestor 5 Mg Tabs (Rosuvastatin calcium) .... Take 1 by mouth once daily 2)  Allergy Vaccine 1:10 Gh  .... Advance with next order  Other Orders: Est. Patient Level III (45409) ENT Referral (ENT)  Patient Instructions: 1)  Please schedule a follow-up appointment in 4 months. 2)  I will notify the Allergy Lab to advance your allergy vaccine to 1:10 with the next order. Please let us know if there are questions.  3)  Our [facility] will help make an ENT consultation appointment with Dr Jearld Fenton.

## 2010-12-26 DIAGNOSIS — J301 Allergic rhinitis due to pollen: Secondary | ICD-10-CM

## 2011-01-04 ENCOUNTER — Ambulatory Visit (INDEPENDENT_AMBULATORY_CARE_PROVIDER_SITE_OTHER): Payer: Medicare Other

## 2011-01-04 DIAGNOSIS — J301 Allergic rhinitis due to pollen: Secondary | ICD-10-CM

## 2011-01-09 NOTE — Miscellaneous (Signed)
Summary: Injection Financial risk analyst   Imported By: Sherian Rein 01/02/2011 15:01:41  _____________________________________________________________________  External Attachment:    Type:   Image     Comment:   External Document

## 2011-01-10 ENCOUNTER — Ambulatory Visit (INDEPENDENT_AMBULATORY_CARE_PROVIDER_SITE_OTHER): Payer: Medicare Other

## 2011-01-10 DIAGNOSIS — J301 Allergic rhinitis due to pollen: Secondary | ICD-10-CM

## 2011-01-16 ENCOUNTER — Ambulatory Visit (INDEPENDENT_AMBULATORY_CARE_PROVIDER_SITE_OTHER): Payer: Medicare Other

## 2011-01-16 DIAGNOSIS — J301 Allergic rhinitis due to pollen: Secondary | ICD-10-CM

## 2011-01-22 ENCOUNTER — Ambulatory Visit (INDEPENDENT_AMBULATORY_CARE_PROVIDER_SITE_OTHER): Payer: Medicare Other

## 2011-01-22 ENCOUNTER — Encounter: Payer: Self-pay | Admitting: Internal Medicine

## 2011-01-22 DIAGNOSIS — J301 Allergic rhinitis due to pollen: Secondary | ICD-10-CM

## 2011-01-29 NOTE — Assessment & Plan Note (Signed)
Summary: ALLERGY/CB  Nurse Visit   Allergies: 1)  ! Amoxicillin  Orders Added: 1)  Allergy Injection (1) [95115] 

## 2011-02-06 ENCOUNTER — Ambulatory Visit (INDEPENDENT_AMBULATORY_CARE_PROVIDER_SITE_OTHER): Payer: Medicare Other

## 2011-02-06 DIAGNOSIS — J301 Allergic rhinitis due to pollen: Secondary | ICD-10-CM

## 2011-02-14 ENCOUNTER — Ambulatory Visit (INDEPENDENT_AMBULATORY_CARE_PROVIDER_SITE_OTHER): Payer: Medicare Other

## 2011-02-14 DIAGNOSIS — J301 Allergic rhinitis due to pollen: Secondary | ICD-10-CM

## 2011-02-19 ENCOUNTER — Ambulatory Visit (INDEPENDENT_AMBULATORY_CARE_PROVIDER_SITE_OTHER): Payer: Medicare Other

## 2011-02-19 DIAGNOSIS — J301 Allergic rhinitis due to pollen: Secondary | ICD-10-CM

## 2011-02-26 ENCOUNTER — Encounter: Payer: Self-pay | Admitting: Internal Medicine

## 2011-02-26 ENCOUNTER — Ambulatory Visit (INDEPENDENT_AMBULATORY_CARE_PROVIDER_SITE_OTHER): Payer: Medicare Other | Admitting: Internal Medicine

## 2011-02-26 DIAGNOSIS — M549 Dorsalgia, unspecified: Secondary | ICD-10-CM

## 2011-02-26 DIAGNOSIS — J069 Acute upper respiratory infection, unspecified: Secondary | ICD-10-CM

## 2011-02-27 ENCOUNTER — Encounter: Payer: Self-pay | Admitting: Internal Medicine

## 2011-02-27 ENCOUNTER — Ambulatory Visit (INDEPENDENT_AMBULATORY_CARE_PROVIDER_SITE_OTHER): Payer: Medicare Other

## 2011-02-27 ENCOUNTER — Ambulatory Visit (INDEPENDENT_AMBULATORY_CARE_PROVIDER_SITE_OTHER): Payer: Medicare Other | Admitting: Internal Medicine

## 2011-02-27 VITALS — BP 118/70 | HR 91 | Ht 61.0 in | Wt 116.0 lb

## 2011-02-27 DIAGNOSIS — R0989 Other specified symptoms and signs involving the circulatory and respiratory systems: Secondary | ICD-10-CM

## 2011-02-27 DIAGNOSIS — J301 Allergic rhinitis due to pollen: Secondary | ICD-10-CM

## 2011-02-27 DIAGNOSIS — J309 Allergic rhinitis, unspecified: Secondary | ICD-10-CM

## 2011-02-27 DIAGNOSIS — R0609 Other forms of dyspnea: Secondary | ICD-10-CM

## 2011-02-27 DIAGNOSIS — J310 Chronic rhinitis: Secondary | ICD-10-CM

## 2011-02-27 MED ORDER — PHENYLEPHRINE HCL 1 % NA SOLN
3.0000 [drp] | Freq: Once | NASAL | Status: AC
  Administered 2011-02-27: 3 [drp] via NASAL

## 2011-02-27 MED ORDER — CEFDINIR 300 MG PO CAPS: 300.0000 mg | ORAL_CAPSULE | Freq: Two times a day (BID) | ORAL | Status: AC

## 2011-02-27 MED ORDER — METHYLPREDNISOLONE ACETATE 80 MG/ML IJ SUSP
80.0000 mg | Freq: Once | INTRAMUSCULAR | Status: AC
  Administered 2011-02-27: 80 mg via INTRAMUSCULAR

## 2011-02-27 NOTE — Progress Notes (Signed)
  Subjective:    Patient ID: Anna Ewing, female    DOB: Sep 30, 1929, 77 y.o.   MRN: [id number]  HPI 53 yoF former smoker, followed for allergic rhinitis. Last here October 30, 2010- reviewed. Allergy vaccine has been advanced successfully to 1:10, GH. Had a cold after Christmas, but no other medical events. She is now noting discomfort blamed on wind and pollen. Wind causes frontal headaches. Blows out little, some postnasal drip.  Using saline nasal rinse.  She had seen Dr Jearld Fenton who did CT sinus and dx'd sinusitis. He gave 10 days antibiotic with little help. She had to reschedule f/u with him. Dr Lenord Fellers gave Z pak yesterday.    Review of Systems See HPI Constitutional:   No weight loss, night sweats,  Fevers, chills, fatigue, lassitude. HEENT:   No headaches,  Difficulty swallowing,  Tooth/dental problems,  Sore throat,                 CV:  No chest pain,  Orthopnea, PND, swelling in lower extremities, anasarca, dizziness, palpitations  GI  No heartburn, indigestion, abdominal pain, nausea, vomiting, diarrhea, change in bowel habits, loss of appetite  Resp: No shortness of breath with exertion or at rest.  No excess mucus, no productive cough,  No non-productive cough,  No coughing up of blood.  No change in color of mucus.  No wheezing.  Skin: no rash or lesions.  GU: no dysuria, change in color of urine, no urgency or frequency.  No flank pain.  MS:  No joint pain or swelling.  No decreased range of motion.  No back pain.  Psych:  No change in mood or affect. No depression or anxiety.  No memory loss.      Objective:   Physical Exam General- Alert, Oriented, Affect-appropriate, Distress- none acute  Skin- rash-none, lesions- none, excoriation- none  Lymphadenopathy- none  Head- atraumatic  Eyes- Gross vision intact, PERRLA, conjunctivae clear secretions  Ears- Hearing - normal, canals, Tm L ,   R ,  Nose- Clear, No- Septal dev. Some mucus,  No- polyps, erosion,  perforation   Throat- Mallampati II , mucosa clear , drainage- none, tonsils- atrophic  Neck- flexible , trachea midline, no stridor , thyroid nl, carotid no bruit  Chest - symmetrical excursion , unlabored     Heart/CV- RRR , no murmur , no gallop  , no rub, nl s1 s2                     - JVD- none , edema- none, stasis changes- none, varices- none     Lung- clear to P&A, wheeze- none, cough- none , dullness-none, rub- none     Chest wall- Abd- tender-no, distended-no, bowel sounds-present, HSM- no  Br/ Gen/ Rectal- Not done, not indicated  Extrem- cyanosis- none, clubbing, none, atrophy- none, strength- nl  Neuro- grossly intact to observation         Assessment & Plan:

## 2011-02-27 NOTE — Assessment & Plan Note (Signed)
Continuing allergy vaccine. Advised to stay in behind air conditioning during peak pollen season.

## 2011-02-27 NOTE — Assessment & Plan Note (Addendum)
Chronic rhinosinusitis.  Pending her return to Dr Jearld Fenton, we will have her take 2 weeks of cefdinir after discussion of cross reactivity of this class with penicillins,.We can also give nasal neb and depo, and she will continue nasal saline rinses.

## 2011-02-27 NOTE — Patient Instructions (Addendum)
Script Cefdinir antibiotic sent to drug store  Neb neo nasal   Depo 80  Keep appointment with Dr Jearld Fenton

## 2011-03-03 ENCOUNTER — Encounter: Payer: Self-pay | Admitting: Internal Medicine

## 2011-03-08 ENCOUNTER — Ambulatory Visit (INDEPENDENT_AMBULATORY_CARE_PROVIDER_SITE_OTHER): Payer: Medicare Other

## 2011-03-08 DIAGNOSIS — J309 Allergic rhinitis, unspecified: Secondary | ICD-10-CM

## 2011-03-13 ENCOUNTER — Ambulatory Visit (INDEPENDENT_AMBULATORY_CARE_PROVIDER_SITE_OTHER): Payer: Medicare Other

## 2011-03-13 DIAGNOSIS — J309 Allergic rhinitis, unspecified: Secondary | ICD-10-CM

## 2011-03-20 ENCOUNTER — Ambulatory Visit (INDEPENDENT_AMBULATORY_CARE_PROVIDER_SITE_OTHER): Payer: Medicare Other

## 2011-03-20 DIAGNOSIS — J309 Allergic rhinitis, unspecified: Secondary | ICD-10-CM

## 2011-03-27 ENCOUNTER — Ambulatory Visit (INDEPENDENT_AMBULATORY_CARE_PROVIDER_SITE_OTHER): Payer: Medicare Other

## 2011-03-27 DIAGNOSIS — J309 Allergic rhinitis, unspecified: Secondary | ICD-10-CM

## 2011-04-02 ENCOUNTER — Ambulatory Visit (INDEPENDENT_AMBULATORY_CARE_PROVIDER_SITE_OTHER): Payer: Medicare Other

## 2011-04-02 DIAGNOSIS — J309 Allergic rhinitis, unspecified: Secondary | ICD-10-CM

## 2011-04-12 ENCOUNTER — Ambulatory Visit (INDEPENDENT_AMBULATORY_CARE_PROVIDER_SITE_OTHER): Payer: Medicare Other

## 2011-04-12 DIAGNOSIS — J309 Allergic rhinitis, unspecified: Secondary | ICD-10-CM

## 2011-04-19 ENCOUNTER — Ambulatory Visit (INDEPENDENT_AMBULATORY_CARE_PROVIDER_SITE_OTHER): Payer: Medicare Other

## 2011-04-19 DIAGNOSIS — J309 Allergic rhinitis, unspecified: Secondary | ICD-10-CM

## 2011-04-22 ENCOUNTER — Encounter: Payer: Self-pay | Admitting: Internal Medicine

## 2011-05-01 ENCOUNTER — Ambulatory Visit (INDEPENDENT_AMBULATORY_CARE_PROVIDER_SITE_OTHER): Payer: Medicare Other

## 2011-05-01 ENCOUNTER — Ambulatory Visit: Payer: Medicare Other

## 2011-05-01 DIAGNOSIS — J309 Allergic rhinitis, unspecified: Secondary | ICD-10-CM

## 2011-05-09 ENCOUNTER — Ambulatory Visit (INDEPENDENT_AMBULATORY_CARE_PROVIDER_SITE_OTHER): Payer: Medicare Other

## 2011-05-09 DIAGNOSIS — J309 Allergic rhinitis, unspecified: Secondary | ICD-10-CM

## 2011-05-13 ENCOUNTER — Ambulatory Visit (INDEPENDENT_AMBULATORY_CARE_PROVIDER_SITE_OTHER): Payer: Medicare Other

## 2011-05-13 DIAGNOSIS — J309 Allergic rhinitis, unspecified: Secondary | ICD-10-CM

## 2011-05-24 ENCOUNTER — Ambulatory Visit (INDEPENDENT_AMBULATORY_CARE_PROVIDER_SITE_OTHER): Payer: Medicare Other

## 2011-05-24 DIAGNOSIS — J309 Allergic rhinitis, unspecified: Secondary | ICD-10-CM

## 2011-05-31 ENCOUNTER — Ambulatory Visit (INDEPENDENT_AMBULATORY_CARE_PROVIDER_SITE_OTHER): Payer: Medicare Other

## 2011-05-31 DIAGNOSIS — J309 Allergic rhinitis, unspecified: Secondary | ICD-10-CM

## 2011-06-13 ENCOUNTER — Ambulatory Visit (INDEPENDENT_AMBULATORY_CARE_PROVIDER_SITE_OTHER): Payer: Medicare Other

## 2011-06-13 DIAGNOSIS — J309 Allergic rhinitis, unspecified: Secondary | ICD-10-CM

## 2011-06-18 ENCOUNTER — Ambulatory Visit (INDEPENDENT_AMBULATORY_CARE_PROVIDER_SITE_OTHER): Payer: Medicare Other

## 2011-06-18 DIAGNOSIS — J309 Allergic rhinitis, unspecified: Secondary | ICD-10-CM

## 2011-06-25 ENCOUNTER — Ambulatory Visit (INDEPENDENT_AMBULATORY_CARE_PROVIDER_SITE_OTHER): Payer: Medicare Other

## 2011-06-25 DIAGNOSIS — J309 Allergic rhinitis, unspecified: Secondary | ICD-10-CM

## 2011-07-01 ENCOUNTER — Ambulatory Visit (INDEPENDENT_AMBULATORY_CARE_PROVIDER_SITE_OTHER): Payer: Medicare Other | Admitting: Internal Medicine

## 2011-07-01 ENCOUNTER — Ambulatory Visit (INDEPENDENT_AMBULATORY_CARE_PROVIDER_SITE_OTHER): Payer: Medicare Other

## 2011-07-01 ENCOUNTER — Encounter: Payer: Self-pay | Admitting: Internal Medicine

## 2011-07-01 VITALS — BP 120/60 | HR 76 | Ht 61.0 in | Wt 114.4 lb

## 2011-07-01 DIAGNOSIS — J309 Allergic rhinitis, unspecified: Secondary | ICD-10-CM

## 2011-07-01 DIAGNOSIS — J301 Allergic rhinitis due to pollen: Secondary | ICD-10-CM

## 2011-07-01 MED ORDER — FLUTICASONE PROPIONATE 50 MCG/ACT NA SUSP: NASAL | Status: AC

## 2011-07-01 NOTE — Progress Notes (Signed)
Subjective:    Patient ID: Anna Ewing, female    DOB: 06-02-1929, 77 y.o.   MRN: [id number]  HPI    Review of Systems     Objective:   Physical Exam        Assessment & Plan:   Subjective:    Patient ID: Anna Ewing, female    DOB: [birth year], 77 y.o.   MRN: [id number]  HPI 02/27/11-82 yoF former smoker, followed for allergic rhinitis. Last here October 30, 2010- reviewed. Allergy vaccine has been advanced successfully to 1:10, GH. Had a cold after Christmas, but no other medical events. She is now noting discomfort blamed on wind and pollen. Wind causes frontal headaches. Blows out little, some postnasal drip.  Using saline nasal rinse.  She had seen Dr Jearld Fenton who did CT sinus and dx'd sinusitis. He gave 10 days antibiotic with little help. She had to reschedule f/u with him. Dr Lenord Fellers gave Z pak yesterday.   07/01/11- 82 yoF former smoker, followed for allergic rhinitis, history of nasal polyps Allergy vaccine now at 1:10 Had succesfull sinus surgery by Dr Jearld Fenton in May- removed nasal polyps and mucus. Still at night she notices some stuffiness lying down. Denies chest tightness or wheeze.  Started using nasal saline and some of her husband's flonase.   ROS: Constitutional:   No-   weight loss, night sweats, fevers, chills, fatigue, lassitude. HEENT:   No-  headaches, difficulty swallowing, tooth/dental problems, sore throat,       No-  sneezing, itching, ear ache,                        + Nasal congestion, post nasal drip,  CV:  No-   chest pain, orthopnea, PND, swelling in lower extremities, anasarca, dizziness, palpitations Resp: No-   shortness of breath with exertion or at rest.              No-   productive cough,  No non-productive cough,  No-  coughing up of blood.              No-   change in color of mucus.  No- wheezing.   Skin: No-   rash or lesions. GI:  No-   heartburn, indigestion, abdominal pain, nausea, vomiting, diarrhea,                 change in bowel  habits, loss of appetite GU: No-   dysuria, change in color of urine, no urgency or frequency.  No- flank pain. MS:  No-   joint pain or swelling.  No- decreased range of motion.  No- back pain. Neuro- grossly normal to observation, Or:  Psych:  No- change in mood or affect. No depression or anxiety.  No memory loss Objective:   Physical Exam General- Alert, Oriented, Affect-appropriate, Distress- none acute Skin- rash-none, lesions- none, excoriation- none Lymphadenopathy- none Head- atraumatic            Eyes- Gross vision intact, PERRLA, conjunctivae clear secretions            Ears- Hearing, canals            Nose- Clear, No- Septal dev, mucus,  ( No polyps seen,)  no-erosion, perforation             Throat- Mallampati II , mucosa clear , drainage- none, tonsils- atrophic    Own teeth Neck- flexible , trachea midline, no stridor , thyroid nl, carotid  no bruit Chest - symmetrical excursion , unlabored           Heart/CV- RRR , no murmur , no gallop  , no rub, nl s1 s2                           - JVD- none , edema- none, stasis changes- none, varices- none           Lung- clear to P&A, wheeze- none, cough- none , dullness-none, rub- none           Chest wall-  Abd- tender-no, distended-no, bowel sounds-present, HSM- no Br/ Gen/ Rectal- Not done, not indicated Extrem- cyanosis- none, clubbing, none, atrophy- none, strength- nl Neuro- grossly intact to observation          Assessment & Plan:

## 2011-07-01 NOTE — Assessment & Plan Note (Addendum)
She seems to felt markedly better shortly after her nasal surgery, but may be getting worse now. Not yet clear if her increased awareness of nasal stuffiness is seasonal. I don't see recurrent polyps.  We will have her continue allergy vaccine and use the flonase regularly.

## 2011-07-01 NOTE — Patient Instructions (Signed)
Continue allergy vaccine  Use the fluticasone/ flonase   Every day    1-2 sprays into each nostril at bedtime.

## 2011-07-11 ENCOUNTER — Ambulatory Visit (INDEPENDENT_AMBULATORY_CARE_PROVIDER_SITE_OTHER): Payer: Medicare Other

## 2011-07-11 DIAGNOSIS — J309 Allergic rhinitis, unspecified: Secondary | ICD-10-CM

## 2011-07-16 ENCOUNTER — Ambulatory Visit (INDEPENDENT_AMBULATORY_CARE_PROVIDER_SITE_OTHER): Payer: Medicare Other

## 2011-07-16 DIAGNOSIS — J309 Allergic rhinitis, unspecified: Secondary | ICD-10-CM

## 2011-07-19 ENCOUNTER — Encounter: Payer: Self-pay | Admitting: Internal Medicine

## 2011-07-23 ENCOUNTER — Ambulatory Visit (INDEPENDENT_AMBULATORY_CARE_PROVIDER_SITE_OTHER): Payer: Medicare Other

## 2011-07-23 DIAGNOSIS — J309 Allergic rhinitis, unspecified: Secondary | ICD-10-CM

## 2011-08-07 ENCOUNTER — Ambulatory Visit (INDEPENDENT_AMBULATORY_CARE_PROVIDER_SITE_OTHER): Payer: Medicare Other

## 2011-08-07 DIAGNOSIS — J309 Allergic rhinitis, unspecified: Secondary | ICD-10-CM

## 2011-08-14 ENCOUNTER — Ambulatory Visit (INDEPENDENT_AMBULATORY_CARE_PROVIDER_SITE_OTHER): Payer: Medicare Other

## 2011-08-14 DIAGNOSIS — J309 Allergic rhinitis, unspecified: Secondary | ICD-10-CM

## 2011-08-22 ENCOUNTER — Ambulatory Visit (INDEPENDENT_AMBULATORY_CARE_PROVIDER_SITE_OTHER): Payer: Medicare Other

## 2011-08-22 DIAGNOSIS — J309 Allergic rhinitis, unspecified: Secondary | ICD-10-CM

## 2011-08-23 ENCOUNTER — Encounter: Payer: Self-pay | Admitting: Internal Medicine

## 2011-08-27 ENCOUNTER — Other Ambulatory Visit: Payer: Medicare Other | Admitting: Internal Medicine

## 2011-08-27 DIAGNOSIS — Z0000 Encounter for general adult medical examination without abnormal findings: Secondary | ICD-10-CM

## 2011-08-27 LAB — VITAMIN D 25 HYDROXY (VIT D DEFICIENCY, FRACTURES): Vit D, 25-Hydroxy: 37 ng/mL (ref 30–89)

## 2011-08-27 LAB — LIPID PANEL
Cholesterol: 166 mg/dL (ref 0–200)
HDL: 60 mg/dL (ref >39)
Triglycerides: 103 mg/dL (ref <150)
VLDL: 21 mg/dL (ref 0–40)

## 2011-08-27 LAB — CBC WITH DIFFERENTIAL/PLATELET
Basophils Relative: 0 % (ref 0–1)
HCT: 37.8 % (ref 36.0–46.0)
Hemoglobin: 12 g/dL (ref 12.0–15.0)
Lymphocytes Relative: 36 % (ref 12–46)
Monocytes Relative: 10 % (ref 3–12)
Neutro Abs: 2.9 10*3/uL (ref 1.7–7.7)
Neutrophils Relative %: 47 % (ref 43–77)
RDW: 14 % (ref 11.5–15.5)

## 2011-08-27 LAB — HEPATIC FUNCTION PANEL
ALT: 16 U/L (ref 0–35)
AST: 26 U/L (ref 0–37)
Albumin: 4.4 g/dL (ref 3.5–5.2)
Alkaline Phosphatase: 43 U/L (ref 39–117)
Total Protein: 7.1 g/dL (ref 6.0–8.3)

## 2011-08-27 LAB — TSH: TSH: 1.481 u[IU]/mL (ref 0.350–4.500)

## 2011-08-27 LAB — BASIC METABOLIC PANEL
CO2: 28 mEq/L (ref 19–32)
Calcium: 9.4 mg/dL (ref 8.4–10.5)
Glucose, Bld: 100 mg/dL — ABNORMAL HIGH (ref 70–99)

## 2011-08-29 ENCOUNTER — Ambulatory Visit (INDEPENDENT_AMBULATORY_CARE_PROVIDER_SITE_OTHER): Payer: Medicare Other | Admitting: Internal Medicine

## 2011-08-29 ENCOUNTER — Encounter: Payer: Self-pay | Admitting: Internal Medicine

## 2011-08-29 ENCOUNTER — Ambulatory Visit (INDEPENDENT_AMBULATORY_CARE_PROVIDER_SITE_OTHER): Payer: Medicare Other

## 2011-08-29 VITALS — BP 122/70 | HR 80 | Temp 98.2°F | Ht 61.0 in | Wt 118.0 lb

## 2011-08-29 DIAGNOSIS — K589 Irritable bowel syndrome without diarrhea: Secondary | ICD-10-CM

## 2011-08-29 DIAGNOSIS — G43909 Migraine, unspecified, not intractable, without status migrainosus: Secondary | ICD-10-CM

## 2011-08-29 DIAGNOSIS — R7689 Other specified abnormal immunological findings in serum: Secondary | ICD-10-CM

## 2011-08-29 DIAGNOSIS — Z0000 Encounter for general adult medical examination without abnormal findings: Secondary | ICD-10-CM

## 2011-08-29 DIAGNOSIS — E785 Hyperlipidemia, unspecified: Secondary | ICD-10-CM

## 2011-08-29 DIAGNOSIS — I10 Essential (primary) hypertension: Secondary | ICD-10-CM

## 2011-08-29 DIAGNOSIS — Z23 Encounter for immunization: Secondary | ICD-10-CM

## 2011-08-29 DIAGNOSIS — R768 Other specified abnormal immunological findings in serum: Secondary | ICD-10-CM

## 2011-08-29 DIAGNOSIS — J309 Allergic rhinitis, unspecified: Secondary | ICD-10-CM

## 2011-08-29 MED ORDER — AMLODIPINE BESYLATE 5 MG PO TABS: 5.0000 mg | ORAL_TABLET | Freq: Every day | ORAL | Status: DC

## 2011-08-29 MED ORDER — ROSUVASTATIN CALCIUM 5 MG PO TABS: 5.0000 mg | ORAL_TABLET | Freq: Every day | ORAL | Status: DC

## 2011-09-10 ENCOUNTER — Ambulatory Visit (INDEPENDENT_AMBULATORY_CARE_PROVIDER_SITE_OTHER): Payer: Medicare Other

## 2011-09-10 DIAGNOSIS — J309 Allergic rhinitis, unspecified: Secondary | ICD-10-CM

## 2011-09-19 ENCOUNTER — Ambulatory Visit (INDEPENDENT_AMBULATORY_CARE_PROVIDER_SITE_OTHER): Payer: Medicare Other

## 2011-09-19 DIAGNOSIS — J309 Allergic rhinitis, unspecified: Secondary | ICD-10-CM

## 2011-09-22 DIAGNOSIS — K589 Irritable bowel syndrome without diarrhea: Secondary | ICD-10-CM | POA: Insufficient documentation

## 2011-09-22 DIAGNOSIS — M810 Age-related osteoporosis without current pathological fracture: Secondary | ICD-10-CM | POA: Insufficient documentation

## 2011-09-22 NOTE — Patient Instructions (Signed)
Continue same medications. Return in 6 months for office visit , blood pressure check & hemoglobin A 1C

## 2011-09-22 NOTE — Progress Notes (Signed)
  Subjective:    Patient ID: Anna Ewing, female    DOB: [birth year], 77 y.o.   MRN: [id number]  HPI pleasant 77 year old white female a patient in this practice since 1988. History of migraine headache, allergic rhinitis, diverticulosis, irritable bowel syndrome, osteoporosis, positive ANA diagnosed 2002 with titer 1:640 homogeneous pattern. Anti-DNA was negative. Saw rheumatologist Dr. Jimmy Footman who felt she did not have significant rheumatology issue. She is here for health maintenance and evaluation of medical problems. History of hypertension treated with Norvasc. History of diabetes mellitus treated with diet. Patient had recent sinus surgery May 2012. History of total abdominal hysterectomy bilateral salpingo-oophorectomy . History of Colpopexy for uterine prolapse, cystocele and rectocele at  [facility] in 2002. Had Pneumovax 2004, gets annual influenza immunization. Tetanus immunization 2003. Colonoscopy by Dr. Kirstie Peri need 2009 showing diverticulosis.  Social history she is a retired Haematologist. Husband is retired from [employer]. Patient does not smoke. Rare alcohol consumption. Completed high school. 2 adult children a daughter and a son. One sister. Family history: Father died at age 60 with congestive heart failure, mother died at age 6 with history of breast cancer and congestive heart failure    Review of Systems  Constitutional: Negative.   HENT: Negative.   Eyes: Negative.   Cardiovascular: Negative.   Gastrointestinal: Negative.   Genitourinary: Negative.   Musculoskeletal: Negative.   Neurological: Negative.   Hematological: Negative.   Psychiatric/Behavioral: Negative.        Objective:   Physical Exam  Vitals reviewed. Constitutional: She appears well-developed and well-nourished. No distress.  HENT:  Head: Normocephalic and atraumatic.  Left Ear: External ear normal.  Mouth/Throat: Oropharynx is clear and moist.  Eyes: Conjunctivae and EOM are normal. Pupils are  equal, round, and reactive to light. No scleral icterus.  Neck: Neck supple. No JVD present. No thyromegaly present.  Cardiovascular: Normal rate, regular rhythm and normal heart sounds.   No murmur heard. Pulmonary/Chest: Effort normal and breath sounds normal. She has no wheezes.       Breasts normal female  Abdominal: She exhibits no mass. There is no tenderness. There is no guarding.  Genitourinary:       Deferred  Musculoskeletal: She exhibits no edema.  Lymphadenopathy:    Cervical adenopathy: status post sinus surgery May 2012OsteoporosisHyperlipidemiaHypertension.  Neurological: She has normal reflexes. No cranial nerve deficit. Coordination normal.  Skin: Skin is warm and dry.  Psychiatric: She has a normal mood and affect. Her behavior is normal. Judgment normal.          Assessment & Plan:  History of migraine headache  History of chronic sinusitis  Allergic rhinitis  Diverticulosis  Irritable bowel syndrome  Hyperlipidemia  Hypertension  Diabetes mellitus  Plan fasting labs drawn and are pending. Return in 6 months.

## 2011-09-23 ENCOUNTER — Ambulatory Visit (INDEPENDENT_AMBULATORY_CARE_PROVIDER_SITE_OTHER): Payer: Medicare Other

## 2011-09-23 DIAGNOSIS — J309 Allergic rhinitis, unspecified: Secondary | ICD-10-CM

## 2011-09-25 ENCOUNTER — Encounter: Payer: Self-pay | Admitting: Internal Medicine

## 2011-09-27 ENCOUNTER — Encounter: Payer: Self-pay | Admitting: Internal Medicine

## 2011-10-03 ENCOUNTER — Ambulatory Visit (INDEPENDENT_AMBULATORY_CARE_PROVIDER_SITE_OTHER): Payer: Medicare Other

## 2011-10-03 DIAGNOSIS — J309 Allergic rhinitis, unspecified: Secondary | ICD-10-CM

## 2011-10-09 ENCOUNTER — Ambulatory Visit (INDEPENDENT_AMBULATORY_CARE_PROVIDER_SITE_OTHER): Payer: Medicare Other

## 2011-10-09 DIAGNOSIS — J309 Allergic rhinitis, unspecified: Secondary | ICD-10-CM

## 2011-10-16 ENCOUNTER — Ambulatory Visit (INDEPENDENT_AMBULATORY_CARE_PROVIDER_SITE_OTHER): Payer: Medicare Other

## 2011-10-16 DIAGNOSIS — J309 Allergic rhinitis, unspecified: Secondary | ICD-10-CM

## 2011-10-23 ENCOUNTER — Ambulatory Visit (INDEPENDENT_AMBULATORY_CARE_PROVIDER_SITE_OTHER): Payer: Medicare Other

## 2011-10-23 DIAGNOSIS — J309 Allergic rhinitis, unspecified: Secondary | ICD-10-CM

## 2011-10-31 ENCOUNTER — Ambulatory Visit (INDEPENDENT_AMBULATORY_CARE_PROVIDER_SITE_OTHER): Payer: Medicare Other

## 2011-10-31 DIAGNOSIS — J309 Allergic rhinitis, unspecified: Secondary | ICD-10-CM

## 2011-11-04 ENCOUNTER — Encounter: Payer: Self-pay | Admitting: Internal Medicine

## 2011-11-08 ENCOUNTER — Ambulatory Visit (INDEPENDENT_AMBULATORY_CARE_PROVIDER_SITE_OTHER): Payer: Medicare Other

## 2011-11-08 DIAGNOSIS — J309 Allergic rhinitis, unspecified: Secondary | ICD-10-CM

## 2011-11-14 ENCOUNTER — Ambulatory Visit (INDEPENDENT_AMBULATORY_CARE_PROVIDER_SITE_OTHER): Payer: Medicare Other

## 2011-11-14 DIAGNOSIS — J309 Allergic rhinitis, unspecified: Secondary | ICD-10-CM

## 2011-11-21 ENCOUNTER — Ambulatory Visit (INDEPENDENT_AMBULATORY_CARE_PROVIDER_SITE_OTHER): Payer: Medicare Other

## 2011-11-21 DIAGNOSIS — J309 Allergic rhinitis, unspecified: Secondary | ICD-10-CM

## 2011-11-28 ENCOUNTER — Ambulatory Visit (INDEPENDENT_AMBULATORY_CARE_PROVIDER_SITE_OTHER): Payer: Medicare Other

## 2011-11-28 DIAGNOSIS — J309 Allergic rhinitis, unspecified: Secondary | ICD-10-CM

## 2011-12-04 ENCOUNTER — Ambulatory Visit (INDEPENDENT_AMBULATORY_CARE_PROVIDER_SITE_OTHER): Payer: Medicare Other

## 2011-12-04 DIAGNOSIS — J309 Allergic rhinitis, unspecified: Secondary | ICD-10-CM

## 2011-12-12 ENCOUNTER — Ambulatory Visit (INDEPENDENT_AMBULATORY_CARE_PROVIDER_SITE_OTHER): Payer: Medicare Other

## 2011-12-12 DIAGNOSIS — J309 Allergic rhinitis, unspecified: Secondary | ICD-10-CM

## 2011-12-19 ENCOUNTER — Ambulatory Visit (INDEPENDENT_AMBULATORY_CARE_PROVIDER_SITE_OTHER): Payer: Medicare Other

## 2011-12-19 DIAGNOSIS — J309 Allergic rhinitis, unspecified: Secondary | ICD-10-CM

## 2011-12-27 ENCOUNTER — Ambulatory Visit (INDEPENDENT_AMBULATORY_CARE_PROVIDER_SITE_OTHER): Payer: Medicare Other

## 2011-12-27 DIAGNOSIS — J309 Allergic rhinitis, unspecified: Secondary | ICD-10-CM

## 2011-12-31 ENCOUNTER — Ambulatory Visit (INDEPENDENT_AMBULATORY_CARE_PROVIDER_SITE_OTHER): Payer: Medicare Other

## 2011-12-31 ENCOUNTER — Encounter: Payer: Self-pay | Admitting: Internal Medicine

## 2011-12-31 ENCOUNTER — Ambulatory Visit (INDEPENDENT_AMBULATORY_CARE_PROVIDER_SITE_OTHER): Payer: Medicare Other | Admitting: Internal Medicine

## 2011-12-31 VITALS — BP 118/74 | HR 85 | Ht 61.0 in | Wt 118.0 lb

## 2011-12-31 DIAGNOSIS — J309 Allergic rhinitis, unspecified: Secondary | ICD-10-CM

## 2011-12-31 DIAGNOSIS — J018 Other acute sinusitis: Secondary | ICD-10-CM

## 2011-12-31 DIAGNOSIS — J301 Allergic rhinitis due to pollen: Secondary | ICD-10-CM

## 2011-12-31 MED ORDER — METHYLPREDNISOLONE ACETATE 80 MG/ML IJ SUSP
80.0000 mg | Freq: Once | INTRAMUSCULAR | Status: AC
  Administered 2011-12-31: 80 mg via INTRAMUSCULAR

## 2011-12-31 MED ORDER — PHENYLEPHRINE HCL 1 % NA SOLN
3.0000 [drp] | Freq: Once | NASAL | Status: AC
  Administered 2011-12-31: 3 [drp] via NASAL

## 2011-12-31 MED ORDER — CLARITHROMYCIN 500 MG PO TABS: ORAL_TABLET | ORAL | Status: AC

## 2011-12-31 NOTE — Patient Instructions (Signed)
Neb neo nasal  Depo 80  Script for biaxin antibiotic sent to drug store

## 2011-12-31 NOTE — Progress Notes (Signed)
Patient ID: [name], female    DOB: [birth year], 78 y.o.   MRN: [id number]  HPI 02/27/11-78 yoF former smoker, followed for allergic rhinitis. Last here October 30, 2010- reviewed. Allergy vaccine has been advanced successfully to 1:10, GH. Had a cold after Christmas, but no other medical events. She is now noting discomfort blamed on wind and pollen. Wind causes frontal headaches. Blows out little, some postnasal drip.  Using saline nasal rinse.  She had seen Dr Jearld Fenton who did CT sinus and dx'd sinusitis. He gave 10 days antibiotic with little help. She had to reschedule f/u with him. Dr Lenord Fellers gave Z pak yesterday.   07/01/11- 78 yoF former smoker, followed for allergic rhinitis, history of nasal polyps Allergy vaccine now at 1:10 Had succesfull sinus surgery by Dr Jearld Fenton in May- removed nasal polyps and mucus. Still at night she notices some stuffiness lying down. Denies chest tightness or wheeze.  Started using nasal saline and some of her husband's flonase.   12/31/11- 78 yoF former smoker, followed for allergic rhinitis, history of nasal polyps FOLLOWS OZH:[id number] Ear pain/pressure headaches, stopped up-nasal congestion,cough-productive-yellow x 2 weeks; stil on vaccine  She continues her allergy vaccine without problems. She also uses one to Claritin D. every day and the saline nasal rinse. Flonase did not help. Had sinus surgery in 2012 and did well after that until she had an acute infection last month. She is still blowing yellow nasal mucus. Right ear is stopped up without pop in. Chest is comfortable. Denies sore throat or fever.  ROS- see HPI Constitutional:   No-   weight loss, night sweats, fevers, chills, fatigue, lassitude. HEENT:   No-  headaches, difficulty swallowing, tooth/dental problems, sore throat,       No-  sneezing, itching, ear ache,    + Nasal congestion, post nasal drip,  CV:  No-   chest pain, orthopnea, PND, swelling in lower extremities, anasarca, dizziness,  palpitations Resp: No-   shortness of breath with exertion or at rest.              No-   productive cough,  No non-productive cough,  No-  coughing up of blood.              No-   change in color of mucus.  No- wheezing.   Skin: No-   rash or lesions. GI:  No-   heartburn, indigestion, abdominal pain, nausea, vomiting, diarrhea,                 change in bowel habits, loss of appetite GU:  MS:  No-   joint pain or swelling.  No- decreased range of motion.  No- back pain. Neuro- grossly normal to observation, Or:  Psych:  No- change in mood or affect. No depression or anxiety.  No memory loss Objective:   Physical Exam General- Alert, Oriented, Affect-appropriate, Distress- none acute Skin- rash-none, lesions- none, excoriation- none Lymphadenopathy- none Head- atraumatic            Eyes- Gross vision intact, PERRLA, conjunctivae clear secretions            Ears- Hearing, canals cerumen w/o occlusion            Nose- Clear, No- Septal dev, mucus,  ( No polyps seen,)  no-erosion, perforation             Throat- Mallampati II , mucosa clear , drainage- none, tonsils- atrophic    Own  teeth Neck- flexible , trachea midline, no stridor , thyroid nl, carotid no bruit Chest - symmetrical excursion , unlabored           Heart/CV- RRR , no murmur , no gallop  , no rub, nl s1 s2                           - JVD- none , edema- none, stasis changes- none, varices- none           Lung- clear to P&A, wheeze- none, cough- none , dullness-none, rub- none           Chest wall-  Abd- Br/ Gen/ Rectal- Not done, not indicated Extrem- cyanosis- none, clubbing, none, atrophy- none, strength- nl Neuro- grossly intact to observation

## 2012-01-04 NOTE — Assessment & Plan Note (Signed)
Recent acute sinusitis with eustachian tube dysfunction. Plan nasal decongestant nebulizer, Depo-Medrol, Biaxin.

## 2012-01-04 NOTE — Assessment & Plan Note (Signed)
Allergy management has done well. We are anticipating an early spring pollen season.

## 2012-01-09 ENCOUNTER — Ambulatory Visit (INDEPENDENT_AMBULATORY_CARE_PROVIDER_SITE_OTHER): Payer: Medicare Other

## 2012-01-09 ENCOUNTER — Telehealth: Payer: Self-pay | Admitting: Internal Medicine

## 2012-01-09 DIAGNOSIS — J309 Allergic rhinitis, unspecified: Secondary | ICD-10-CM

## 2012-01-09 DIAGNOSIS — H6980 Other specified disorders of Eustachian tube, unspecified ear: Secondary | ICD-10-CM

## 2012-01-09 NOTE — Telephone Encounter (Signed)
Per CY: this has been going on for quite some time.  Needs referral to ENT.   Called and spoke with pt. Informed her of CY's recs.  Pt states she has seen Dr. Jearld Fenton in the past.  Order sent to [facility] for referral.

## 2012-01-09 NOTE — Telephone Encounter (Signed)
Pt states she will finish the Biaxin tonight and that her nasal congestion is much better but her Rt ear is still stopped up. There may be only a slight improvement in the ear. She says there is no ear pain. Pls advise. Allergies  Allergen Reactions  . Amoxicillin     REACTION: rash

## 2012-01-15 ENCOUNTER — Ambulatory Visit (INDEPENDENT_AMBULATORY_CARE_PROVIDER_SITE_OTHER): Payer: Medicare Other

## 2012-01-15 DIAGNOSIS — J309 Allergic rhinitis, unspecified: Secondary | ICD-10-CM

## 2012-01-21 ENCOUNTER — Ambulatory Visit (INDEPENDENT_AMBULATORY_CARE_PROVIDER_SITE_OTHER): Payer: Medicare Other

## 2012-01-21 DIAGNOSIS — J309 Allergic rhinitis, unspecified: Secondary | ICD-10-CM

## 2012-01-29 ENCOUNTER — Ambulatory Visit (INDEPENDENT_AMBULATORY_CARE_PROVIDER_SITE_OTHER): Payer: Medicare Other

## 2012-01-29 DIAGNOSIS — J309 Allergic rhinitis, unspecified: Secondary | ICD-10-CM

## 2012-02-03 ENCOUNTER — Encounter: Payer: Self-pay | Admitting: Internal Medicine

## 2012-02-07 ENCOUNTER — Ambulatory Visit (INDEPENDENT_AMBULATORY_CARE_PROVIDER_SITE_OTHER): Payer: Medicare Other

## 2012-02-07 DIAGNOSIS — J309 Allergic rhinitis, unspecified: Secondary | ICD-10-CM

## 2012-02-12 ENCOUNTER — Ambulatory Visit (INDEPENDENT_AMBULATORY_CARE_PROVIDER_SITE_OTHER): Payer: Medicare Other

## 2012-02-12 DIAGNOSIS — J309 Allergic rhinitis, unspecified: Secondary | ICD-10-CM

## 2012-02-18 ENCOUNTER — Ambulatory Visit (INDEPENDENT_AMBULATORY_CARE_PROVIDER_SITE_OTHER): Payer: Medicare Other

## 2012-02-18 DIAGNOSIS — J309 Allergic rhinitis, unspecified: Secondary | ICD-10-CM

## 2012-02-25 ENCOUNTER — Other Ambulatory Visit: Payer: Medicare Other | Admitting: Internal Medicine

## 2012-02-25 ENCOUNTER — Ambulatory Visit (INDEPENDENT_AMBULATORY_CARE_PROVIDER_SITE_OTHER): Payer: Medicare Other

## 2012-02-25 DIAGNOSIS — Z79899 Other long term (current) drug therapy: Secondary | ICD-10-CM

## 2012-02-25 DIAGNOSIS — J309 Allergic rhinitis, unspecified: Secondary | ICD-10-CM

## 2012-02-25 DIAGNOSIS — E785 Hyperlipidemia, unspecified: Secondary | ICD-10-CM

## 2012-02-25 LAB — HEPATIC FUNCTION PANEL
ALT: 20 U/L (ref 0–35)
Alkaline Phosphatase: 43 U/L (ref 39–117)
Bilirubin, Direct: <0.1 mg/dL (ref 0.0–0.3)
Total Protein: 6.8 g/dL (ref 6.0–8.3)

## 2012-02-25 LAB — LIPID PANEL
LDL Cholesterol: 85 mg/dL (ref 0–99)
Triglycerides: 108 mg/dL (ref <150)

## 2012-02-27 ENCOUNTER — Encounter: Payer: Self-pay | Admitting: Internal Medicine

## 2012-02-27 ENCOUNTER — Ambulatory Visit (INDEPENDENT_AMBULATORY_CARE_PROVIDER_SITE_OTHER): Payer: Medicare Other | Admitting: Internal Medicine

## 2012-02-27 VITALS — BP 160/82 | HR 92 | Temp 98.6°F | Wt 115.0 lb

## 2012-02-27 DIAGNOSIS — E785 Hyperlipidemia, unspecified: Secondary | ICD-10-CM

## 2012-02-27 DIAGNOSIS — H9191 Unspecified hearing loss, right ear: Secondary | ICD-10-CM

## 2012-02-27 DIAGNOSIS — E119 Type 2 diabetes mellitus without complications: Secondary | ICD-10-CM

## 2012-02-27 DIAGNOSIS — Z8669 Personal history of other diseases of the nervous system and sense organs: Secondary | ICD-10-CM

## 2012-02-27 DIAGNOSIS — H9190 Unspecified hearing loss, unspecified ear: Secondary | ICD-10-CM

## 2012-02-27 DIAGNOSIS — I10 Essential (primary) hypertension: Secondary | ICD-10-CM

## 2012-02-27 NOTE — Progress Notes (Signed)
  Subjective:    Patient ID: Anna Ewing, female    DOB: [birth year], 78 y.o.   MRN: [id number]  HPI   In today for 6 month recheck. She is 78 years old with history of migraine headaches, osteoporosis, hyperlipidemia, hypertension, diet-controlled diabetes mellitus& allergic rhinitis. Patient's  blood pressure was initially elevated when she arrived. When I rechecked it was 140/80. She's complaining of persistent hearing loss right ear. Has been treated by ENT physician with a course of prednisone which helped briefly but has not cleared the problem. Patient sees an allergist, Dr. Fannie Knee. In May 2012 she had evaluation by ENT physician, Dr. Jearld Fenton for sinusitis as she had  been treated with Levaquin and a Depo-Medrol injection without success. His notes indicate her hearing was grossly intact at that point. Subsequently underwent endoscopic sinus surgery May 2012. Says hearing loss  has been ongoing for year. Denies ringing in years. Denies vertigo. Does not have wax in her right external ear canal.  History of increased eosinophils consistent with allergic rhinitis. Took allergy injections in the 1960s. Says symptoms are worse in the spring and fall. Still getting allergy injections through Dr. Maple Hudson.  Operative report dated 04/05/2011 indicates patient had sinusitis refractory to medical therapy with eustachian tube dysfunction issues. CT scan showed chronic sinus disease. Patient had bilateral maxillary antrostomy, bilateral total ethmoidectomy, bilateral frontal sinusotomies, bilateral sphenoidotomy with fusion computer guidance.    Review of Systems     Objective:   Physical Exam  Blood pressure rechecked 140/80. Chest clear to auscultation; cardiac exam: regular rate and rhythm; extremities without edema, diabetic foot exam normal. Left TM clear. Right TM dull but not red.       Assessment & Plan:    Hearing loss right ear with history of eustachian tube dysfunction  Status post  bilateral maxillary antrostomy, bilateral total ethmoidectomy, bilateral frontal sinusotomies bilateral sphenoidotomy with fusion computer guidance may 2012 for chronic sinusitis refractory to medical treatment  Allergic rhinitis  Migraine headaches  Hypertension  Hyperlipidemia  Diabetes mellitus-diet controlled  Plan: Reviewed with her lipid panel liver functions which are within normal limits. Hemoglobin A1c is stable. Patient will be referred to [facility] for evaluation of right hearing loss-history of eustachian tube dysfunction.  Plan: Patient will return in 6 months for physical examination. Have given her prescription for Tylenol No. 3 (#60) 1 by mouth every 4-6 hours when necessary for migraine headache which she seldom has to use.

## 2012-02-27 NOTE — Patient Instructions (Signed)
Continue same medications. We will arrange for ENT appointment at [facility] regarding your right ear hearing loss. Return in 6 months for physical exam.

## 2012-03-02 ENCOUNTER — Telehealth: Payer: Self-pay | Admitting: Internal Medicine

## 2012-03-04 NOTE — Telephone Encounter (Signed)
Office is aware of patient's appointment.

## 2012-03-05 ENCOUNTER — Ambulatory Visit (INDEPENDENT_AMBULATORY_CARE_PROVIDER_SITE_OTHER): Payer: Medicare Other

## 2012-03-05 DIAGNOSIS — J309 Allergic rhinitis, unspecified: Secondary | ICD-10-CM

## 2012-03-12 ENCOUNTER — Ambulatory Visit (INDEPENDENT_AMBULATORY_CARE_PROVIDER_SITE_OTHER): Payer: Medicare Other

## 2012-03-12 DIAGNOSIS — J309 Allergic rhinitis, unspecified: Secondary | ICD-10-CM

## 2012-03-25 ENCOUNTER — Ambulatory Visit (INDEPENDENT_AMBULATORY_CARE_PROVIDER_SITE_OTHER): Payer: Medicare Other

## 2012-03-25 DIAGNOSIS — J309 Allergic rhinitis, unspecified: Secondary | ICD-10-CM

## 2012-03-31 ENCOUNTER — Ambulatory Visit (INDEPENDENT_AMBULATORY_CARE_PROVIDER_SITE_OTHER): Payer: Medicare Other

## 2012-03-31 DIAGNOSIS — J309 Allergic rhinitis, unspecified: Secondary | ICD-10-CM

## 2012-04-10 ENCOUNTER — Ambulatory Visit (INDEPENDENT_AMBULATORY_CARE_PROVIDER_SITE_OTHER): Payer: Medicare Other

## 2012-04-10 DIAGNOSIS — J309 Allergic rhinitis, unspecified: Secondary | ICD-10-CM

## 2012-04-16 ENCOUNTER — Ambulatory Visit (INDEPENDENT_AMBULATORY_CARE_PROVIDER_SITE_OTHER): Payer: Medicare Other

## 2012-04-16 DIAGNOSIS — J309 Allergic rhinitis, unspecified: Secondary | ICD-10-CM

## 2012-04-20 ENCOUNTER — Ambulatory Visit (INDEPENDENT_AMBULATORY_CARE_PROVIDER_SITE_OTHER): Payer: Medicare Other

## 2012-04-20 DIAGNOSIS — J309 Allergic rhinitis, unspecified: Secondary | ICD-10-CM

## 2012-04-28 ENCOUNTER — Encounter: Payer: Self-pay | Admitting: Internal Medicine

## 2012-05-01 ENCOUNTER — Ambulatory Visit (INDEPENDENT_AMBULATORY_CARE_PROVIDER_SITE_OTHER): Payer: Medicare Other

## 2012-05-01 DIAGNOSIS — J309 Allergic rhinitis, unspecified: Secondary | ICD-10-CM

## 2012-05-13 ENCOUNTER — Ambulatory Visit (INDEPENDENT_AMBULATORY_CARE_PROVIDER_SITE_OTHER): Payer: Medicare Other

## 2012-05-13 DIAGNOSIS — J309 Allergic rhinitis, unspecified: Secondary | ICD-10-CM

## 2012-05-19 ENCOUNTER — Ambulatory Visit (INDEPENDENT_AMBULATORY_CARE_PROVIDER_SITE_OTHER): Payer: Medicare Other

## 2012-05-19 DIAGNOSIS — J309 Allergic rhinitis, unspecified: Secondary | ICD-10-CM

## 2012-05-27 ENCOUNTER — Ambulatory Visit (INDEPENDENT_AMBULATORY_CARE_PROVIDER_SITE_OTHER): Payer: Medicare Other

## 2012-05-27 DIAGNOSIS — J309 Allergic rhinitis, unspecified: Secondary | ICD-10-CM

## 2012-06-09 ENCOUNTER — Ambulatory Visit (INDEPENDENT_AMBULATORY_CARE_PROVIDER_SITE_OTHER): Payer: Medicare Other

## 2012-06-09 DIAGNOSIS — J309 Allergic rhinitis, unspecified: Secondary | ICD-10-CM

## 2012-06-17 ENCOUNTER — Ambulatory Visit (INDEPENDENT_AMBULATORY_CARE_PROVIDER_SITE_OTHER): Payer: Medicare Other

## 2012-06-17 DIAGNOSIS — J309 Allergic rhinitis, unspecified: Secondary | ICD-10-CM

## 2012-06-23 ENCOUNTER — Telehealth: Payer: Self-pay

## 2012-06-23 DIAGNOSIS — R9389 Abnormal findings on diagnostic imaging of other specified body structures: Secondary | ICD-10-CM

## 2012-06-23 NOTE — Telephone Encounter (Signed)
Patient sheduled for CT chest with contrast on 06/29/2012 at 10:15 at [facility] imaging -315 location. Patient aware of appointment.

## 2012-06-25 ENCOUNTER — Ambulatory Visit (INDEPENDENT_AMBULATORY_CARE_PROVIDER_SITE_OTHER): Payer: Medicare Other

## 2012-06-25 DIAGNOSIS — J309 Allergic rhinitis, unspecified: Secondary | ICD-10-CM

## 2012-06-29 ENCOUNTER — Other Ambulatory Visit: Payer: Medicare Other

## 2012-06-30 ENCOUNTER — Telehealth: Payer: Self-pay

## 2012-06-30 ENCOUNTER — Ambulatory Visit (INDEPENDENT_AMBULATORY_CARE_PROVIDER_SITE_OTHER): Payer: Medicare Other

## 2012-06-30 ENCOUNTER — Ambulatory Visit (INDEPENDENT_AMBULATORY_CARE_PROVIDER_SITE_OTHER): Payer: Medicare Other | Admitting: Internal Medicine

## 2012-06-30 ENCOUNTER — Encounter: Payer: Self-pay | Admitting: Internal Medicine

## 2012-06-30 VITALS — BP 122/60 | HR 85 | Ht 61.0 in | Wt 114.0 lb

## 2012-06-30 DIAGNOSIS — J929 Pleural plaque without asbestos: Secondary | ICD-10-CM

## 2012-06-30 DIAGNOSIS — J309 Allergic rhinitis, unspecified: Secondary | ICD-10-CM

## 2012-06-30 DIAGNOSIS — J301 Allergic rhinitis due to pollen: Secondary | ICD-10-CM

## 2012-06-30 NOTE — Patient Instructions (Addendum)
Ok to continue your flonase and Claritin-D  You could try adding otc nasal spray Cromolyn/ nasalcrom, to see if it worked better.  You could try substituting Allegra-D instead of Claritin- D to see if you prefer one over the other as a combination antihistamine- decongestant.   Please call as needed

## 2012-06-30 NOTE — Progress Notes (Signed)
Patient ID: [name], female    DOB: December 06, 1928, 78 y.o.   MRN: [id number]  HPI 02/27/11-82 yoF former smoker, followed for allergic rhinitis. Last here October 30, 2010- reviewed. Allergy vaccine has been advanced successfully to 1:10, GH. Had a cold after Christmas, but no other medical events. She is now noting discomfort blamed on wind and pollen. Wind causes frontal headaches. Blows out little, some postnasal drip.  Using saline nasal rinse.  She had seen Dr Jearld Fenton who did CT sinus and dx'd sinusitis. He gave 10 days antibiotic with little help. She had to reschedule f/u with him. Dr Lenord Fellers gave Z pak yesterday.   07/01/11- 82 yoF former smoker, followed for allergic rhinitis, history of nasal polyps Allergy vaccine now at 1:10 Had succesfull sinus surgery by Dr Jearld Fenton in May- removed nasal polyps and mucus. Still at night she notices some stuffiness lying down. Denies chest tightness or wheeze.  Started using nasal saline and some of her husband's flonase.   12/31/11- 82 yoF former smoker, followed for allergic rhinitis, history of nasal polyps FOLLOWS [id number] Ear pain/pressure headaches, stopped up-nasal congestion,cough-productive-yellow x 2 weeks; stil on vaccine  She continues her allergy vaccine without problems. She also uses one to Claritin D. every day and the saline nasal rinse. Flonase did not help. Had sinus surgery in 2012 and did well after that until she had an acute infection last month. She is still blowing yellow nasal mucus. Right ear is stopped up without pop in. Chest is comfortable. Denies sore throat or fever.  06/30/12- 83 yoF former smoker, followed for allergic rhinitis, history of nasal polyps complicated by DM, HBP Headaches and drainage since rain started-otherwise has done well-still on vaccine. Denies problems or concerns w/ allergy vaccine 1;10 GH. Humidity brings nasal congestion, some frontal headache, postnasal drip. Denies SOB, wheeze, purulent or  bloody. Ears ok. Uses flonase and claritin-D.  ROS- see HPI Constitutional:   No-   weight loss, night sweats, fevers, chills, fatigue, lassitude. HEENT:   + headaches, difficulty swallowing, tooth/dental problems, sore throat,       No-  sneezing, itching, ear ache,    + Nasal congestion, post nasal drip,  CV:  No-   chest pain, orthopnea, PND, swelling in lower extremities, anasarca, dizziness, palpitations Resp: No-   shortness of breath with exertion or at rest.              No-   productive cough,  No non-productive cough,  No-  coughing up of blood.              No-   change in color of mucus.  No- wheezing.   Skin: No-   rash or lesions. GI:  No-   heartburn, indigestion, abdominal pain, nausea, vomiting,  GU:  MS:  No-   joint pain or swelling.   Neuro- nothing unusual  Psych:  No- change in mood or affect. No depression or anxiety.  No memory loss Objective:   Physical Exam General- Alert, Oriented, Affect-appropriate, Distress- none acute Skin- rash-none, lesions- none, excoriation- none Lymphadenopathy- none Head- atraumatic            Eyes- Gross vision intact, PERRLA, conjunctivae clear secretions            Ears- Hearing, canals cerumen w/o occlusion            Nose- Clear, No- Septal dev, mucus,  ( No polyps seen,)  no-erosion, perforation  Throat- Mallampati II , mucosa clear ,+mucoid drainage, tonsils- atrophic    Own teeth Neck- flexible , trachea midline, no stridor , thyroid nl, carotid no bruit Chest - symmetrical excursion , unlabored           Heart/CV- RRR , no murmur , no gallop  , no rub, nl s1 s2                           - JVD- none , edema- none, stasis changes- none, varices- none           Lung- clear to P&A, wheeze- none, cough- none , dullness-none, rub- none           Chest wall-  Abd- Br/ Gen/ Rectal- Not done, not indicated Extrem- cyanosis- none, clubbing, none, atrophy- none, strength- nl Neuro- grossly intact to observation

## 2012-06-30 NOTE — Telephone Encounter (Signed)
Patient informed Ct chest rescheduled for Thurs. 07/02/2012, after receiving pre-certification from [employer]

## 2012-06-30 NOTE — Assessment & Plan Note (Signed)
Visible bland drainage w/o polyps. Probably allergic and non-allergic rhinitis. We discussed her meds. She wants to avoid overdrying. She might try Allegra-D, Nasalcrom.

## 2012-07-02 ENCOUNTER — Ambulatory Visit
Admission: RE | Admit: 2012-07-02 | Discharge: 2012-07-02 | Disposition: A | Discharge status: Home / Self Care | Payer: Medicare Other | Source: Ambulatory Visit | Attending: Internal Medicine | Admitting: Internal Medicine

## 2012-07-02 DIAGNOSIS — R9389 Abnormal findings on diagnostic imaging of other specified body structures: Secondary | ICD-10-CM

## 2012-07-02 MED ORDER — IOHEXOL 300 MG/ML  SOLN
75.0000 mL | Freq: Once | INTRAMUSCULAR | Status: AC | PRN
  Administered 2012-07-02: 75 mL via INTRAVENOUS

## 2012-07-08 ENCOUNTER — Ambulatory Visit (INDEPENDENT_AMBULATORY_CARE_PROVIDER_SITE_OTHER): Payer: Medicare Other

## 2012-07-08 DIAGNOSIS — J309 Allergic rhinitis, unspecified: Secondary | ICD-10-CM

## 2012-07-10 ENCOUNTER — Ambulatory Visit (INDEPENDENT_AMBULATORY_CARE_PROVIDER_SITE_OTHER): Payer: Medicare Other

## 2012-07-10 DIAGNOSIS — J309 Allergic rhinitis, unspecified: Secondary | ICD-10-CM

## 2012-07-22 ENCOUNTER — Ambulatory Visit (INDEPENDENT_AMBULATORY_CARE_PROVIDER_SITE_OTHER): Payer: Medicare Other

## 2012-07-22 DIAGNOSIS — J309 Allergic rhinitis, unspecified: Secondary | ICD-10-CM

## 2012-07-29 ENCOUNTER — Ambulatory Visit (INDEPENDENT_AMBULATORY_CARE_PROVIDER_SITE_OTHER): Payer: Medicare Other

## 2012-07-29 DIAGNOSIS — J309 Allergic rhinitis, unspecified: Secondary | ICD-10-CM

## 2012-08-06 ENCOUNTER — Ambulatory Visit (INDEPENDENT_AMBULATORY_CARE_PROVIDER_SITE_OTHER): Payer: Medicare Other

## 2012-08-06 DIAGNOSIS — J309 Allergic rhinitis, unspecified: Secondary | ICD-10-CM

## 2012-08-11 ENCOUNTER — Ambulatory Visit (INDEPENDENT_AMBULATORY_CARE_PROVIDER_SITE_OTHER): Payer: Medicare Other

## 2012-08-11 DIAGNOSIS — J309 Allergic rhinitis, unspecified: Secondary | ICD-10-CM

## 2012-08-26 ENCOUNTER — Ambulatory Visit (INDEPENDENT_AMBULATORY_CARE_PROVIDER_SITE_OTHER): Payer: Medicare Other

## 2012-08-26 DIAGNOSIS — J309 Allergic rhinitis, unspecified: Secondary | ICD-10-CM

## 2012-08-31 ENCOUNTER — Other Ambulatory Visit: Payer: Medicare Other | Admitting: Internal Medicine

## 2012-08-31 DIAGNOSIS — E119 Type 2 diabetes mellitus without complications: Secondary | ICD-10-CM

## 2012-08-31 DIAGNOSIS — E785 Hyperlipidemia, unspecified: Secondary | ICD-10-CM

## 2012-08-31 DIAGNOSIS — M810 Age-related osteoporosis without current pathological fracture: Secondary | ICD-10-CM

## 2012-08-31 DIAGNOSIS — I10 Essential (primary) hypertension: Secondary | ICD-10-CM

## 2012-08-31 LAB — LIPID PANEL
Cholesterol: 160 mg/dL (ref 0–200)
LDL Cholesterol: 86 mg/dL (ref 0–99)
Triglycerides: 76 mg/dL (ref <150)
VLDL: 15 mg/dL (ref 0–40)

## 2012-08-31 LAB — CBC WITH DIFFERENTIAL/PLATELET
Basophils Absolute: 0 10*3/uL (ref 0.0–0.1)
Eosinophils Relative: 8 % — ABNORMAL HIGH (ref 0–5)
Hemoglobin: 12.2 g/dL (ref 12.0–15.0)
Lymphocytes Relative: 39 % (ref 12–46)
MCH: 30.2 pg (ref 26.0–34.0)
MCHC: 33.6 g/dL (ref 30.0–36.0)
Monocytes Absolute: 0.6 10*3/uL (ref 0.1–1.0)
Monocytes Relative: 11 % (ref 3–12)
Neutro Abs: 2.3 10*3/uL (ref 1.7–7.7)

## 2012-08-31 LAB — COMPREHENSIVE METABOLIC PANEL
Albumin: 4.4 g/dL (ref 3.5–5.2)
Alkaline Phosphatase: 46 U/L (ref 39–117)
CO2: 24 mEq/L (ref 19–32)
Chloride: 103 mEq/L (ref 96–112)
Creat: 0.5 mg/dL (ref 0.50–1.10)
Glucose, Bld: 103 mg/dL — ABNORMAL HIGH (ref 70–99)
Potassium: 4.3 mEq/L (ref 3.5–5.3)
Total Bilirubin: 0.6 mg/dL (ref 0.3–1.2)
Total Protein: 6.7 g/dL (ref 6.0–8.3)

## 2012-08-31 LAB — HEMOGLOBIN A1C: Hgb A1c MFr Bld: 6.3 % — ABNORMAL HIGH (ref <5.7)

## 2012-09-01 ENCOUNTER — Encounter: Payer: Self-pay | Admitting: Internal Medicine

## 2012-09-01 ENCOUNTER — Ambulatory Visit (INDEPENDENT_AMBULATORY_CARE_PROVIDER_SITE_OTHER): Payer: Medicare Other | Admitting: Internal Medicine

## 2012-09-01 ENCOUNTER — Ambulatory Visit (INDEPENDENT_AMBULATORY_CARE_PROVIDER_SITE_OTHER): Payer: Medicare Other

## 2012-09-01 VITALS — BP 140/80 | HR 92 | Temp 98.7°F | Ht 61.5 in | Wt 114.0 lb

## 2012-09-01 DIAGNOSIS — H9190 Unspecified hearing loss, unspecified ear: Secondary | ICD-10-CM

## 2012-09-01 DIAGNOSIS — E119 Type 2 diabetes mellitus without complications: Secondary | ICD-10-CM

## 2012-09-01 DIAGNOSIS — H9191 Unspecified hearing loss, right ear: Secondary | ICD-10-CM

## 2012-09-01 DIAGNOSIS — K589 Irritable bowel syndrome without diarrhea: Secondary | ICD-10-CM

## 2012-09-01 DIAGNOSIS — I10 Essential (primary) hypertension: Secondary | ICD-10-CM

## 2012-09-01 DIAGNOSIS — J309 Allergic rhinitis, unspecified: Secondary | ICD-10-CM

## 2012-09-01 DIAGNOSIS — E785 Hyperlipidemia, unspecified: Secondary | ICD-10-CM

## 2012-09-01 DIAGNOSIS — Z23 Encounter for immunization: Secondary | ICD-10-CM

## 2012-09-01 DIAGNOSIS — M810 Age-related osteoporosis without current pathological fracture: Secondary | ICD-10-CM

## 2012-09-01 DIAGNOSIS — Z8669 Personal history of other diseases of the nervous system and sense organs: Secondary | ICD-10-CM

## 2012-09-01 DIAGNOSIS — L509 Urticaria, unspecified: Secondary | ICD-10-CM

## 2012-09-01 LAB — POCT URINALYSIS DIPSTICK
Bilirubin, UA: NEGATIVE
Blood, UA: NEGATIVE
Glucose, UA: NEGATIVE
Urobilinogen, UA: NEGATIVE
pH, UA: 7

## 2012-09-01 NOTE — Progress Notes (Signed)
Subjective:    Patient ID: [name], female    DOB: Feb 19, 1929, 78 y.o.   MRN: [id number]  HPI 78 year old white female who has had some issues with intermittent right hearing loss. I sent her to [facility] hospitals to see Dr. Marrianne Mood, ENT physician. She subsequently saw Dr. Rulon Eisenmenger, a neurologist and had an MR I of the brain which showed dural enhancement. It was not clear what was causing that. She subsequently had a spinal tap which was negative. Had SPEP which was negative. Chest x-ray raised the possibility of sarcoidosis and we have done a limited CT to make sure she does not have active sarcoid. Still has intermittent right hearing loss. I've advised her to return to see Dr. Lenoria Farrier for treatment options. It may be that this is something a hearing aid will not help but certainly we have ruled out tumor , multiple sclerosis and CNS infection as causes. I explained this to her today in detail. She says yesterday when it was raining she could not hear well out of right ear. No tinnitus. Hearing comes and goes. Migraines have virtually disappeared she has the rare occasional headache that is not very bad. She's had some issues with urticaria recently. Have recommended she take Claritin at bedtime in addition to taking Claritin-D in the mornings for sinus congestion. History of urticaria dating back to high school when she was under stress. She's not sure why she has had a recent recurrence. Told her it would be difficult to figure this out and would be best history with Claritin for one to 2 months on a daily basis.  History of migraine headaches. They don't occur as frequently as they use to.  History of allergic rhinitis. History of osteoporosis. History of hyperlipidemia. History of hypertension. History of diet-controlled type 2 diabetes with hemoglobin A1c slightly over 6 most of the time.  Fracture right wrist 1994. Lymphangitis of the left upper extremity 1997.  Total total hysterectomy  BSO, colpopexy, for uterine prolapse, cystocele and rectocele repair [facility] hospitals 2002; sinus surgery in May 2012.  History of positive ANA diagnosed in 2002 with negative anti-DNA. Seen by rheumatologist to doubted that patient had connective tissue disease.  Social history: She is a retired Haematologist. Husband is retired from [employer]. She is married and has a high school education. A son and a daughter.  Patient does not smoke or consume alcohol.  Family history: Father died at age 15 of heart failure. Mother died at age 51 with history of breast cancer that metastasized to the bone and congestive heart failure.  Colonoscopy 07/12/2008 by Dr. Matthias Hughs showed diverticulosis.  Tetanus immunization 2003. Pneumovax immunization September 2004. Gets annual influenza immunization.  Remote history of  irritable bowel syndrome.    Review of Systems  Constitutional: Negative.   HENT:       Allergic rhinitis symptoms  Eyes: Negative.   Respiratory: Negative.   Cardiovascular: Negative.   Gastrointestinal: Negative.   Genitourinary: Negative.   Musculoskeletal: Negative.   Neurological:       Intermittent hearing loss right ear  Psychiatric/Behavioral: Negative.        Objective:   Physical Exam  Vitals reviewed. Constitutional: She is oriented to person, place, and time. She appears well-developed and well-nourished. No distress.  HENT:  Head: Normocephalic and atraumatic.  Right Ear: External ear normal.  Left Ear: External ear normal.  Mouth/Throat: No oropharyngeal exudate.  Eyes: Conjunctivae normal and EOM are normal. Pupils are equal, round,  and reactive to light. Right eye exhibits no discharge. Left eye exhibits no discharge. No scleral icterus.  Neck: Neck supple. No JVD present. No thyromegaly present.  Cardiovascular: Normal rate, regular rhythm, normal heart sounds and intact distal pulses.   No murmur heard. Pulmonary/Chest: Effort normal and breath sounds normal.  She has no wheezes. She has no rales.  Abdominal: Soft. Bowel sounds are normal. She exhibits no distension and no mass. There is no tenderness. There is no rebound and no guarding.  Genitourinary:       GYN exam deferred  Musculoskeletal: She exhibits no edema.  Lymphadenopathy:    She has no cervical adenopathy.  Neurological: She is alert and oriented to person, place, and time. She has normal reflexes. She displays normal reflexes. No cranial nerve deficit. She exhibits normal muscle tone. Coordination normal.  Skin: Skin is warm and dry. No rash noted. She is not diaphoretic. No pallor.       No hives seen today  Psychiatric: She has a normal mood and affect. Her behavior is normal. Judgment and thought content normal.          Assessment & Plan:  Hypertension  History of urticaria  History of migraine headache  Intermittent hearing loss right ear  Plan: Return in one year or as needed. Influenza immunization given today. Return to see Dr. Lenoria Farrier for wrapup session with specific questions to be answered about her right ear hearing loss. Continue same medications. Subjective:   Patient presents for Medicare Annual/Subsequent preventive examination.  Review Past Medical/Family/Social: See above   Risk Factors  Current exercise habits: stay active walking and cleaning house Dietary issues discussed: low fat low carb  Cardiac risk factors: hypertension  Depression Screen  (Note: if answer to either of the following is "Yes", a more complete depression screening is indicated)   Over the past two weeks, have you felt down, depressed or hopeless? No  Over the past two weeks, have you felt little interest or pleasure in doing things? No Have you lost interest or pleasure in daily life? No Do you often feel hopeless? No Do you cry easily over simple problems? No   Activities of Daily Living  In your present state of health, do you have any difficulty performing the  following activities?:   Driving? No  Managing money? No  Feeding yourself? No  Getting from bed to chair? No  Climbing a flight of stairs? No  Preparing food and eating?: No  Bathing or showering? No  Getting dressed: No  Getting to the toilet? No  Using the toilet:No  Moving around from place to place: No  In the past year have you fallen or had a near fall?: yes a year ago and broke bone in foot but not since.  Are you sexually active? No  Do you have more than one partner? No   Hearing Difficulties: No  Do you often ask people to speak up or repeat themselves? Yes on occasion- trouble with right ear being evaluated by Dr. Lenoria Farrier Do you experience ringing or noises in your ears? No  Do you have difficulty understanding soft or whispered voices? sometimes Do you feel that you have a problem with memory? No Do you often misplace items? No    Home Safety:  Do you have a smoke alarm at your residence? Yes Do you have grab bars in the bathroom?no Do you have throw rugs in your house?no   Cognitive Testing  Alert? Yes  Normal Appearance?Yes  Oriented to person? Yes Place? Yes  Time? Yes  Recall of three objects? Yes  Can perform simple calculations? Yes  Displays appropriate judgment?Yes  Can read the correct time from a watch face?Yes   List the Names of Other Physician/Practitioners you currently use:  See referral list for the physicians patient is currently seeing.  Dr. Marrianne Mood- ENT [facility] re hearing right ear.   Review of Systems:   Objective:     General appearance: Appears stated age and mildly obese  Head: Normocephalic, without obvious abnormality, atraumatic  Eyes: conj clear, EOMi PEERLA  Ears: normal TM's and external ear canals both ears  Nose: Nares normal. Septum midline. Mucosa normal. No drainage or sinus tenderness.  Throat: lips, mucosa, and tongue normal; teeth and gums normal  Neck: no adenopathy, no carotid bruit, no JVD,  supple, symmetrical, trachea midline and thyroid not enlarged, symmetric, no tenderness/mass/nodules  No CVA tenderness.  Lungs: clear to auscultation bilaterally  Breasts: normal appearance, no masses or tenderness,  Heart: regular rate and rhythm, S1, S2 normal, no murmur, click, rub or gallop  Abdomen: soft, non-tender; bowel sounds normal; no masses, no organomegaly  Musculoskeletal: ROM normal in all joints, no crepitus, no deformity, Normal muscle strengthen. Back  is symmetric, no curvature. Skin: Skin color, texture, turgor normal. No rashes or lesions  Lymph nodes: Cervical, supraclavicular, and axillary nodes normal.  Neurologic: CN 2 -12 Normal, Normal symmetric reflexes. Normal coordination and gait  Psych: Alert & Oriented x 3, Mood appear stable.    Assessment:    Annual wellness medicare exam   Plan:    During the course of the visit the patient was educated and counseled about appropriate screening and preventive services including:   See Epic     Patient Instructions (the written plan) was given to the patient.  Medicare Attestation  I have personally reviewed:  The patient's medical and social history  Their use of alcohol, tobacco or illicit drugs  Their current medications and supplements  The patient's functional ability including ADLs,fall risks, home safety risks, cognitive, and hearing and visual impairment  Diet and physical activities  Evidence for depression or mood disorders  The patient's weight, height, BMI, and visual acuity have been recorded in the chart. I have made referrals, counseling, and provided education to the patient based on review of the above and I have provided the patient with a written personalized care plan for preventive services.

## 2012-09-01 NOTE — Patient Instructions (Addendum)
Take Claritin 10 mg at bedtime for high for one to 2 months. He may continue taking Claritin-D in the mornings. Return to see Dr. Lenoria Farrier regarding hearing issues right ear. See me in 6 months to one year.

## 2012-09-15 ENCOUNTER — Other Ambulatory Visit: Payer: Self-pay

## 2012-09-15 MED ORDER — AMLODIPINE BESYLATE 5 MG PO TABS: 5.0000 mg | ORAL_TABLET | Freq: Every day | ORAL | Status: DC

## 2012-09-15 MED ORDER — ROSUVASTATIN CALCIUM 5 MG PO TABS: 5.0000 mg | ORAL_TABLET | Freq: Every day | ORAL | Status: DC

## 2012-09-16 ENCOUNTER — Ambulatory Visit (INDEPENDENT_AMBULATORY_CARE_PROVIDER_SITE_OTHER): Payer: Medicare Other

## 2012-09-16 DIAGNOSIS — J309 Allergic rhinitis, unspecified: Secondary | ICD-10-CM

## 2012-09-23 ENCOUNTER — Ambulatory Visit (INDEPENDENT_AMBULATORY_CARE_PROVIDER_SITE_OTHER): Payer: Medicare Other

## 2012-09-23 DIAGNOSIS — J309 Allergic rhinitis, unspecified: Secondary | ICD-10-CM

## 2012-09-28 ENCOUNTER — Ambulatory Visit (INDEPENDENT_AMBULATORY_CARE_PROVIDER_SITE_OTHER): Payer: Medicare Other

## 2012-09-28 DIAGNOSIS — J309 Allergic rhinitis, unspecified: Secondary | ICD-10-CM

## 2012-10-07 ENCOUNTER — Ambulatory Visit (INDEPENDENT_AMBULATORY_CARE_PROVIDER_SITE_OTHER): Payer: Medicare Other

## 2012-10-07 DIAGNOSIS — J309 Allergic rhinitis, unspecified: Secondary | ICD-10-CM

## 2012-10-14 ENCOUNTER — Ambulatory Visit (INDEPENDENT_AMBULATORY_CARE_PROVIDER_SITE_OTHER): Payer: Medicare Other

## 2012-10-14 DIAGNOSIS — J309 Allergic rhinitis, unspecified: Secondary | ICD-10-CM

## 2012-10-19 ENCOUNTER — Telehealth: Payer: Self-pay | Admitting: Internal Medicine

## 2012-10-19 NOTE — Telephone Encounter (Signed)
Patient seen recently at home by [employer] for health screening. They recommend she receive Tdap vaccine. Last one was given August 2003. Patient contacted regard need for Tdap vaccine advised to make appointment for that.

## 2012-10-23 ENCOUNTER — Ambulatory Visit (INDEPENDENT_AMBULATORY_CARE_PROVIDER_SITE_OTHER): Payer: Medicare Other | Admitting: Internal Medicine

## 2012-10-23 ENCOUNTER — Ambulatory Visit (INDEPENDENT_AMBULATORY_CARE_PROVIDER_SITE_OTHER): Payer: Medicare Other

## 2012-10-23 DIAGNOSIS — J309 Allergic rhinitis, unspecified: Secondary | ICD-10-CM

## 2012-10-23 DIAGNOSIS — Z23 Encounter for immunization: Secondary | ICD-10-CM

## 2012-10-23 MED ORDER — TETANUS-DIPHTH-ACELL PERTUSSIS 5-2.5-18.5 LF-MCG/0.5 IM SUSP: 0.5000 mL | Freq: Once | INTRAMUSCULAR | Status: DC

## 2012-10-28 ENCOUNTER — Ambulatory Visit (INDEPENDENT_AMBULATORY_CARE_PROVIDER_SITE_OTHER): Payer: Medicare Other

## 2012-10-28 DIAGNOSIS — J309 Allergic rhinitis, unspecified: Secondary | ICD-10-CM

## 2012-11-03 ENCOUNTER — Ambulatory Visit (INDEPENDENT_AMBULATORY_CARE_PROVIDER_SITE_OTHER): Payer: Medicare Other

## 2012-11-03 DIAGNOSIS — J309 Allergic rhinitis, unspecified: Secondary | ICD-10-CM

## 2012-11-13 ENCOUNTER — Ambulatory Visit (INDEPENDENT_AMBULATORY_CARE_PROVIDER_SITE_OTHER): Payer: Medicare Other

## 2012-11-13 DIAGNOSIS — J309 Allergic rhinitis, unspecified: Secondary | ICD-10-CM

## 2012-11-17 ENCOUNTER — Ambulatory Visit (INDEPENDENT_AMBULATORY_CARE_PROVIDER_SITE_OTHER): Payer: Medicare Other

## 2012-11-17 DIAGNOSIS — J309 Allergic rhinitis, unspecified: Secondary | ICD-10-CM

## 2012-11-27 ENCOUNTER — Ambulatory Visit (INDEPENDENT_AMBULATORY_CARE_PROVIDER_SITE_OTHER): Payer: Medicare Other

## 2012-11-27 DIAGNOSIS — J309 Allergic rhinitis, unspecified: Secondary | ICD-10-CM

## 2012-12-02 ENCOUNTER — Ambulatory Visit: Payer: Medicare Other

## 2012-12-02 ENCOUNTER — Ambulatory Visit (INDEPENDENT_AMBULATORY_CARE_PROVIDER_SITE_OTHER): Payer: Medicare Other

## 2012-12-02 DIAGNOSIS — J309 Allergic rhinitis, unspecified: Secondary | ICD-10-CM

## 2012-12-03 ENCOUNTER — Ambulatory Visit: Payer: Medicare Other

## 2012-12-04 ENCOUNTER — Ambulatory Visit: Payer: Medicare Other

## 2012-12-07 ENCOUNTER — Ambulatory Visit (INDEPENDENT_AMBULATORY_CARE_PROVIDER_SITE_OTHER): Payer: Medicare Other

## 2012-12-07 DIAGNOSIS — J309 Allergic rhinitis, unspecified: Secondary | ICD-10-CM

## 2012-12-09 ENCOUNTER — Ambulatory Visit (INDEPENDENT_AMBULATORY_CARE_PROVIDER_SITE_OTHER): Payer: Medicare Other

## 2012-12-09 DIAGNOSIS — J309 Allergic rhinitis, unspecified: Secondary | ICD-10-CM

## 2012-12-15 ENCOUNTER — Ambulatory Visit (INDEPENDENT_AMBULATORY_CARE_PROVIDER_SITE_OTHER): Payer: Medicare Other

## 2012-12-15 DIAGNOSIS — J309 Allergic rhinitis, unspecified: Secondary | ICD-10-CM

## 2012-12-16 ENCOUNTER — Ambulatory Visit: Payer: Medicare Other

## 2012-12-22 ENCOUNTER — Encounter: Payer: Self-pay | Admitting: Internal Medicine

## 2012-12-23 ENCOUNTER — Ambulatory Visit (INDEPENDENT_AMBULATORY_CARE_PROVIDER_SITE_OTHER): Payer: Medicare Other

## 2012-12-23 ENCOUNTER — Ambulatory Visit: Payer: Medicare Other

## 2012-12-23 DIAGNOSIS — J309 Allergic rhinitis, unspecified: Secondary | ICD-10-CM

## 2012-12-29 ENCOUNTER — Ambulatory Visit (INDEPENDENT_AMBULATORY_CARE_PROVIDER_SITE_OTHER): Payer: Medicare Other

## 2012-12-29 DIAGNOSIS — J309 Allergic rhinitis, unspecified: Secondary | ICD-10-CM

## 2012-12-30 ENCOUNTER — Ambulatory Visit: Payer: Medicare Other

## 2012-12-31 ENCOUNTER — Ambulatory Visit: Payer: Medicare Other | Admitting: Internal Medicine

## 2013-01-06 ENCOUNTER — Ambulatory Visit: Payer: Medicare Other

## 2013-01-08 ENCOUNTER — Ambulatory Visit (INDEPENDENT_AMBULATORY_CARE_PROVIDER_SITE_OTHER): Payer: Medicare Other

## 2013-01-15 ENCOUNTER — Ambulatory Visit (INDEPENDENT_AMBULATORY_CARE_PROVIDER_SITE_OTHER): Payer: Medicare Other

## 2013-01-18 ENCOUNTER — Ambulatory Visit (INDEPENDENT_AMBULATORY_CARE_PROVIDER_SITE_OTHER): Payer: Medicare Other

## 2013-01-25 ENCOUNTER — Ambulatory Visit: Payer: Medicare Other | Admitting: Internal Medicine

## 2013-01-25 ENCOUNTER — Ambulatory Visit: Payer: Medicare Other

## 2013-01-27 ENCOUNTER — Ambulatory Visit (INDEPENDENT_AMBULATORY_CARE_PROVIDER_SITE_OTHER): Payer: Medicare Other

## 2013-01-27 DIAGNOSIS — J309 Allergic rhinitis, unspecified: Secondary | ICD-10-CM

## 2013-01-29 ENCOUNTER — Ambulatory Visit (INDEPENDENT_AMBULATORY_CARE_PROVIDER_SITE_OTHER): Payer: Medicare Other | Admitting: Internal Medicine

## 2013-01-29 ENCOUNTER — Encounter: Payer: Self-pay | Admitting: Internal Medicine

## 2013-01-29 VITALS — BP 126/78 | HR 86 | Ht 61.0 in | Wt 114.4 lb

## 2013-01-29 DIAGNOSIS — J301 Allergic rhinitis due to pollen: Secondary | ICD-10-CM

## 2013-01-29 NOTE — Patient Instructions (Addendum)
Sample Dymista nasal spray  Try 1-2 puffs each nostril every night at bedtime    Try this for a while instead of flonase. When the sample is used up, then go back to flonase.   We can continue allergy vaccine 1:10 GH

## 2013-01-29 NOTE — Progress Notes (Signed)
Patient ID: [name], female    DOB: Nov 30, 1928, 79 y.o.   MRN: [id number]  HPI 02/27/11-82 yoF former smoker, followed for allergic rhinitis. Last here October 30, 2010- reviewed. Allergy vaccine has been advanced successfully to 1:10, GH. Had a cold after Christmas, but no other medical events. She is now noting discomfort blamed on wind and pollen. Wind causes frontal headaches. Blows out little, some postnasal drip.  Using saline nasal rinse.  She had seen Dr Jearld Fenton who did CT sinus and dx'd sinusitis. He gave 10 days antibiotic with little help. She had to reschedule f/u with him. Dr Lenord Fellers gave Z pak yesterday.   07/01/11- 82 yoF former smoker, followed for allergic rhinitis, history of nasal polyps Allergy vaccine now at 1:10 Had succesfull sinus surgery by Dr Jearld Fenton in May- removed nasal polyps and mucus. Still at night she notices some stuffiness lying down. Denies chest tightness or wheeze.  Started using nasal saline and some of her husband's flonase.   12/31/11- 82 yoF former smoker, followed for allergic rhinitis, history of nasal polyps FOLLOWS [id number] Ear pain/pressure headaches, stopped up-nasal congestion,cough-productive-yellow x 2 weeks; stil on vaccine  She continues her allergy vaccine without problems. She also uses one to Claritin D. every day and the saline nasal rinse. Flonase did not help. Had sinus surgery in 2012 and did well after that until she had an acute infection last month. She is still blowing yellow nasal mucus. Right ear is stopped up without pop in. Chest is comfortable. Denies sore throat or fever.  06/30/12- 83 yoF former smoker, followed for allergic rhinitis, history of nasal polyps complicated by DM, HBP Headaches and drainage since rain started-otherwise has done well-still on vaccine. Denies problems or concerns w/ allergy vaccine 1;10 GH. Humidity brings nasal congestion, some frontal headache, postnasal drip. Denies SOB, wheeze, purulent or  bloody. Ears ok. Uses flonase and claritin-D.  01/29/13-83 yoF former smoker, followed for allergic rhinitis, history of nasal polyps complicated by DM, HBP FOLLOWS FOR: sneezing,lots of mucus, headaches in past few weeks-"worst its been in a long time"; still on allergy vaccine She blames early spring pollens. Denies headache, purulent nasal discharge, ear discomfort, cough or wheeze. Continues allergy vaccine 1:10 GH and has felt that helped her. Continues Flonase nasal spray.   ROS- see HPI Constitutional:   No-   weight loss, night sweats, fevers, chills, fatigue, lassitude. HEENT:   No- headaches, difficulty swallowing, tooth/dental problems, sore throat,       +  sneezing, itching, ear ache, + Nasal congestion, post nasal drip,  CV:  No-   chest pain, orthopnea, PND, swelling in lower extremities, anasarca, dizziness, palpitations Resp: No-   shortness of breath with exertion or at rest.              No-   productive cough,  No non-productive cough,  No-  coughing up of blood.              No-   change in color of mucus.  No- wheezing.   Skin: No-   rash or lesions. GI:  No-   heartburn, indigestion, abdominal pain, nausea, vomiting,  GU:  MS:  No-   joint pain or swelling.   Neuro- nothing unusual  Psych:  No- change in mood or affect. No depression or anxiety.  No memory loss Objective:   Physical Exam General- Alert, Oriented, Affect-appropriate, Distress- none acute. Elderly woman who appears comfortable. Skin- rash-none, lesions- none,  excoriation- none Lymphadenopathy- none Head- atraumatic            Eyes- Gross vision intact, PERRLA, conjunctivae clear secretions            Ears- Hearing, canals cerumen w/o occlusion            Nose- Clear, No- Septal dev, mucus,  No- polyps,  no-erosion, perforation             Throat- Mallampati II , mucosa clear ,+mucoid drainage, tonsils- atrophic    Own teeth Neck- flexible , trachea midline, no stridor , thyroid nl, carotid no  bruit Chest - symmetrical excursion , unlabored           Heart/CV- RRR , no murmur , no gallop  , no rub, nl s1 s2                           - JVD- none , edema- none, stasis changes- none, varices- none           Lung- clear to P&A, wheeze- none, cough- none , dullness-none, rub- none           Chest wall-  Abd- Br/ Gen/ Rectal- Not done, not indicated Extrem- cyanosis- none, clubbing, none, atrophy- none, strength- nl Neuro- grossly intact to observation

## 2013-01-30 DIAGNOSIS — H9190 Unspecified hearing loss, unspecified ear: Secondary | ICD-10-CM | POA: Insufficient documentation

## 2013-02-01 ENCOUNTER — Encounter: Payer: Self-pay | Admitting: Internal Medicine

## 2013-02-01 NOTE — Assessment & Plan Note (Signed)
By the timing, this is probably a seasonal exacerbation related to early tree pollens. Plan-replace Flonase with sample Dymista nasal spray. Discussed antihistamines and her vaccine.

## 2013-02-03 ENCOUNTER — Ambulatory Visit (INDEPENDENT_AMBULATORY_CARE_PROVIDER_SITE_OTHER): Payer: Medicare Other

## 2013-02-08 ENCOUNTER — Ambulatory Visit (INDEPENDENT_AMBULATORY_CARE_PROVIDER_SITE_OTHER): Payer: Medicare Other

## 2013-02-08 DIAGNOSIS — J309 Allergic rhinitis, unspecified: Secondary | ICD-10-CM

## 2013-02-10 ENCOUNTER — Ambulatory Visit: Payer: Medicare Other

## 2013-02-16 ENCOUNTER — Ambulatory Visit: Payer: Medicare Other | Admitting: Internal Medicine

## 2013-02-17 ENCOUNTER — Ambulatory Visit: Payer: Medicare Other

## 2013-02-22 ENCOUNTER — Ambulatory Visit (INDEPENDENT_AMBULATORY_CARE_PROVIDER_SITE_OTHER): Payer: Medicare Other

## 2013-02-22 DIAGNOSIS — J309 Allergic rhinitis, unspecified: Secondary | ICD-10-CM

## 2013-03-03 ENCOUNTER — Ambulatory Visit (INDEPENDENT_AMBULATORY_CARE_PROVIDER_SITE_OTHER): Payer: Medicare Other

## 2013-03-03 DIAGNOSIS — J309 Allergic rhinitis, unspecified: Secondary | ICD-10-CM

## 2013-03-08 ENCOUNTER — Other Ambulatory Visit: Payer: Self-pay | Admitting: Internal Medicine

## 2013-03-08 ENCOUNTER — Other Ambulatory Visit: Payer: Medicare Other | Admitting: Internal Medicine

## 2013-03-08 DIAGNOSIS — Z79899 Other long term (current) drug therapy: Secondary | ICD-10-CM

## 2013-03-08 DIAGNOSIS — E785 Hyperlipidemia, unspecified: Secondary | ICD-10-CM

## 2013-03-08 LAB — HEPATIC FUNCTION PANEL
AST: 22 U/L (ref 0–37)
Alkaline Phosphatase: 50 U/L (ref 39–117)
Bilirubin, Direct: 0.1 mg/dL (ref 0.0–0.3)
Total Bilirubin: 0.5 mg/dL (ref 0.3–1.2)

## 2013-03-09 ENCOUNTER — Ambulatory Visit (INDEPENDENT_AMBULATORY_CARE_PROVIDER_SITE_OTHER): Payer: Medicare Other | Admitting: Internal Medicine

## 2013-03-09 ENCOUNTER — Encounter: Payer: Self-pay | Admitting: Internal Medicine

## 2013-03-09 ENCOUNTER — Ambulatory Visit (INDEPENDENT_AMBULATORY_CARE_PROVIDER_SITE_OTHER): Payer: Medicare Other

## 2013-03-09 VITALS — BP 128/82 | HR 94 | Temp 97.6°F | Ht 61.0 in | Wt 113.0 lb

## 2013-03-09 DIAGNOSIS — H9110 Presbycusis, unspecified ear: Secondary | ICD-10-CM

## 2013-03-09 DIAGNOSIS — E119 Type 2 diabetes mellitus without complications: Secondary | ICD-10-CM

## 2013-03-09 DIAGNOSIS — I10 Essential (primary) hypertension: Secondary | ICD-10-CM

## 2013-03-09 DIAGNOSIS — E785 Hyperlipidemia, unspecified: Secondary | ICD-10-CM

## 2013-03-09 DIAGNOSIS — J309 Allergic rhinitis, unspecified: Secondary | ICD-10-CM

## 2013-03-09 DIAGNOSIS — H9113 Presbycusis, bilateral: Secondary | ICD-10-CM

## 2013-03-09 LAB — HEMOGLOBIN A1C: Hgb A1c MFr Bld: 6.2 % — ABNORMAL HIGH (ref <5.7)

## 2013-03-09 NOTE — Patient Instructions (Addendum)
Continue same medications and return in 6 months. Have annual diabetic eye exam in the near future. Please get Zostavax vaccine at pharmacy

## 2013-03-09 NOTE — Progress Notes (Signed)
  Subjective:    Patient ID: Anna Ewing, female    DOB: 1929-08-18, 79 y.o.   MRN: [id number]  HPI Six-month recheck on hypertension, diet controlled type 2 diabetes mellitus, and hyperlipidemia. Takes Crestor for hyperlipidemia. Lipid panel is excellent on Crestor. Liver functions are normal. Hemoglobin A1c is 6.2% and stable with diet control. Blood pressure under good control on current regimen. No other complaints. She now has bilateral hearing aids. Says she is due to see ophthalmologist in the near future.   Review of Systems     Objective:   Physical Exam Skin is warm and dry. Has bilateral hearing aids. Neck is supple without JVD thyromegaly or carotid bruits. Chest is clear to auscultation. Cardiac exam regular rate and rhythm normal S1 and S2. Extremities without edema. Diabetic foot exam without ulcers or calluses.        Assessment & Plan:  Controlled type 2 diabetes mellitus-hemoglobin A1c stable with diet control  Hyperlipidemia-treated with Crestor - pleased with results  Hypertension-stable on current regimen  Hearing loss -now has bilateral hearing aids  Plan: Return in 6 months for physical examination. Order given for her to have Zostavax vaccine through pharmacy  Spent 25 minutes with patient

## 2013-03-24 ENCOUNTER — Ambulatory Visit (INDEPENDENT_AMBULATORY_CARE_PROVIDER_SITE_OTHER): Payer: Medicare Other

## 2013-03-24 DIAGNOSIS — J309 Allergic rhinitis, unspecified: Secondary | ICD-10-CM

## 2013-03-31 ENCOUNTER — Ambulatory Visit (INDEPENDENT_AMBULATORY_CARE_PROVIDER_SITE_OTHER): Payer: Medicare Other

## 2013-03-31 DIAGNOSIS — J309 Allergic rhinitis, unspecified: Secondary | ICD-10-CM

## 2013-04-05 ENCOUNTER — Encounter: Payer: Self-pay | Admitting: Internal Medicine

## 2013-04-07 ENCOUNTER — Ambulatory Visit: Payer: Medicare Other

## 2013-04-14 ENCOUNTER — Ambulatory Visit (INDEPENDENT_AMBULATORY_CARE_PROVIDER_SITE_OTHER): Payer: Medicare Other

## 2013-04-14 DIAGNOSIS — J309 Allergic rhinitis, unspecified: Secondary | ICD-10-CM

## 2013-04-20 ENCOUNTER — Ambulatory Visit (INDEPENDENT_AMBULATORY_CARE_PROVIDER_SITE_OTHER): Payer: Medicare Other

## 2013-04-20 DIAGNOSIS — J309 Allergic rhinitis, unspecified: Secondary | ICD-10-CM

## 2013-04-21 ENCOUNTER — Ambulatory Visit: Payer: Medicare Other

## 2013-04-21 ENCOUNTER — Ambulatory Visit (INDEPENDENT_AMBULATORY_CARE_PROVIDER_SITE_OTHER): Payer: Medicare Other

## 2013-04-21 DIAGNOSIS — J309 Allergic rhinitis, unspecified: Secondary | ICD-10-CM

## 2013-04-28 ENCOUNTER — Ambulatory Visit: Payer: Medicare Other

## 2013-05-05 ENCOUNTER — Ambulatory Visit (INDEPENDENT_AMBULATORY_CARE_PROVIDER_SITE_OTHER): Payer: Medicare Other

## 2013-05-05 DIAGNOSIS — J309 Allergic rhinitis, unspecified: Secondary | ICD-10-CM

## 2013-05-12 ENCOUNTER — Ambulatory Visit: Payer: Medicare Other

## 2013-05-17 ENCOUNTER — Ambulatory Visit (INDEPENDENT_AMBULATORY_CARE_PROVIDER_SITE_OTHER): Payer: Medicare Other

## 2013-05-17 DIAGNOSIS — J309 Allergic rhinitis, unspecified: Secondary | ICD-10-CM

## 2013-05-26 ENCOUNTER — Ambulatory Visit (INDEPENDENT_AMBULATORY_CARE_PROVIDER_SITE_OTHER): Payer: Medicare Other

## 2013-05-26 DIAGNOSIS — J309 Allergic rhinitis, unspecified: Secondary | ICD-10-CM

## 2013-06-02 ENCOUNTER — Ambulatory Visit: Payer: Medicare Other

## 2013-06-03 ENCOUNTER — Ambulatory Visit (INDEPENDENT_AMBULATORY_CARE_PROVIDER_SITE_OTHER): Payer: Medicare Other

## 2013-06-03 DIAGNOSIS — J309 Allergic rhinitis, unspecified: Secondary | ICD-10-CM

## 2013-06-09 ENCOUNTER — Ambulatory Visit (INDEPENDENT_AMBULATORY_CARE_PROVIDER_SITE_OTHER): Payer: Medicare Other

## 2013-06-09 DIAGNOSIS — J309 Allergic rhinitis, unspecified: Secondary | ICD-10-CM

## 2013-06-16 ENCOUNTER — Ambulatory Visit: Payer: Medicare Other

## 2013-06-23 ENCOUNTER — Ambulatory Visit (INDEPENDENT_AMBULATORY_CARE_PROVIDER_SITE_OTHER): Payer: Medicare Other

## 2013-06-23 DIAGNOSIS — J309 Allergic rhinitis, unspecified: Secondary | ICD-10-CM

## 2013-06-29 ENCOUNTER — Ambulatory Visit (INDEPENDENT_AMBULATORY_CARE_PROVIDER_SITE_OTHER): Payer: Medicare Other

## 2013-06-29 DIAGNOSIS — J309 Allergic rhinitis, unspecified: Secondary | ICD-10-CM

## 2013-06-30 ENCOUNTER — Ambulatory Visit: Payer: Medicare Other

## 2013-07-06 ENCOUNTER — Ambulatory Visit: Payer: Medicare Other

## 2013-07-07 ENCOUNTER — Ambulatory Visit (INDEPENDENT_AMBULATORY_CARE_PROVIDER_SITE_OTHER): Payer: Medicare Other

## 2013-07-07 DIAGNOSIS — J309 Allergic rhinitis, unspecified: Secondary | ICD-10-CM

## 2013-07-14 ENCOUNTER — Ambulatory Visit: Payer: Medicare Other

## 2013-07-21 ENCOUNTER — Ambulatory Visit (INDEPENDENT_AMBULATORY_CARE_PROVIDER_SITE_OTHER): Payer: Medicare Other

## 2013-07-21 DIAGNOSIS — J309 Allergic rhinitis, unspecified: Secondary | ICD-10-CM

## 2013-07-28 ENCOUNTER — Ambulatory Visit: Payer: Medicare Other

## 2013-07-29 ENCOUNTER — Ambulatory Visit (INDEPENDENT_AMBULATORY_CARE_PROVIDER_SITE_OTHER): Payer: Medicare Other

## 2013-07-29 DIAGNOSIS — J309 Allergic rhinitis, unspecified: Secondary | ICD-10-CM

## 2013-08-04 ENCOUNTER — Ambulatory Visit (INDEPENDENT_AMBULATORY_CARE_PROVIDER_SITE_OTHER): Payer: Medicare Other

## 2013-08-04 DIAGNOSIS — J309 Allergic rhinitis, unspecified: Secondary | ICD-10-CM

## 2013-08-11 ENCOUNTER — Ambulatory Visit (INDEPENDENT_AMBULATORY_CARE_PROVIDER_SITE_OTHER): Payer: Medicare Other

## 2013-08-11 DIAGNOSIS — J309 Allergic rhinitis, unspecified: Secondary | ICD-10-CM

## 2013-08-18 ENCOUNTER — Ambulatory Visit: Payer: Medicare Other

## 2013-08-25 ENCOUNTER — Ambulatory Visit (INDEPENDENT_AMBULATORY_CARE_PROVIDER_SITE_OTHER): Payer: Medicare Other

## 2013-08-25 DIAGNOSIS — J309 Allergic rhinitis, unspecified: Secondary | ICD-10-CM

## 2013-09-01 ENCOUNTER — Ambulatory Visit (INDEPENDENT_AMBULATORY_CARE_PROVIDER_SITE_OTHER): Payer: Medicare Other

## 2013-09-01 DIAGNOSIS — J309 Allergic rhinitis, unspecified: Secondary | ICD-10-CM

## 2013-09-06 ENCOUNTER — Other Ambulatory Visit: Payer: Medicare Other | Admitting: Internal Medicine

## 2013-09-06 DIAGNOSIS — Z1329 Encounter for screening for other suspected endocrine disorder: Secondary | ICD-10-CM

## 2013-09-06 DIAGNOSIS — Z79899 Other long term (current) drug therapy: Secondary | ICD-10-CM

## 2013-09-06 DIAGNOSIS — I10 Essential (primary) hypertension: Secondary | ICD-10-CM

## 2013-09-06 DIAGNOSIS — Z130 Encounter for screening for diseases of the blood and blood-forming organs and certain disorders involving the immune mechanism: Secondary | ICD-10-CM

## 2013-09-06 DIAGNOSIS — E119 Type 2 diabetes mellitus without complications: Secondary | ICD-10-CM

## 2013-09-06 DIAGNOSIS — E785 Hyperlipidemia, unspecified: Secondary | ICD-10-CM

## 2013-09-06 LAB — CBC WITH DIFFERENTIAL/PLATELET
Eosinophils Absolute: 0.4 10*3/uL (ref 0.0–0.7)
Hemoglobin: 12.6 g/dL (ref 12.0–15.0)
Lymphs Abs: 2.1 10*3/uL (ref 0.7–4.0)
MCH: 31 pg (ref 26.0–34.0)
Monocytes Relative: 9 % (ref 3–12)
Neutrophils Relative %: 57 % (ref 43–77)
RBC: 4.06 MIL/uL (ref 3.87–5.11)

## 2013-09-06 LAB — LIPID PANEL
Cholesterol: 173 mg/dL (ref 0–200)
LDL Cholesterol: 85 mg/dL (ref 0–99)
Total CHOL/HDL Ratio: 2.5 Ratio
Triglycerides: 94 mg/dL (ref <150)
VLDL: 19 mg/dL (ref 0–40)

## 2013-09-06 LAB — COMPREHENSIVE METABOLIC PANEL
AST: 21 U/L (ref 0–37)
Albumin: 4.3 g/dL (ref 3.5–5.2)
Alkaline Phosphatase: 45 U/L (ref 39–117)
Potassium: 4.5 mEq/L (ref 3.5–5.3)
Sodium: 134 mEq/L — ABNORMAL LOW (ref 135–145)
Total Bilirubin: 0.6 mg/dL (ref 0.3–1.2)
Total Protein: 6.8 g/dL (ref 6.0–8.3)

## 2013-09-06 LAB — HEMOGLOBIN A1C
Hgb A1c MFr Bld: 6.4 % — ABNORMAL HIGH (ref <5.7)
Mean Plasma Glucose: 137 mg/dL — ABNORMAL HIGH (ref <117)

## 2013-09-06 LAB — TSH: TSH: 1.379 u[IU]/mL (ref 0.350–4.500)

## 2013-09-07 ENCOUNTER — Ambulatory Visit (INDEPENDENT_AMBULATORY_CARE_PROVIDER_SITE_OTHER): Payer: Medicare Other

## 2013-09-07 ENCOUNTER — Ambulatory Visit (INDEPENDENT_AMBULATORY_CARE_PROVIDER_SITE_OTHER): Payer: Medicare Other | Admitting: Internal Medicine

## 2013-09-07 ENCOUNTER — Encounter: Payer: Self-pay | Admitting: Internal Medicine

## 2013-09-07 VITALS — BP 152/84 | HR 100 | Temp 99.1°F | Ht 61.0 in | Wt 110.0 lb

## 2013-09-07 DIAGNOSIS — Z8669 Personal history of other diseases of the nervous system and sense organs: Secondary | ICD-10-CM

## 2013-09-07 DIAGNOSIS — I10 Essential (primary) hypertension: Secondary | ICD-10-CM

## 2013-09-07 DIAGNOSIS — J309 Allergic rhinitis, unspecified: Secondary | ICD-10-CM

## 2013-09-07 DIAGNOSIS — E785 Hyperlipidemia, unspecified: Secondary | ICD-10-CM

## 2013-09-07 DIAGNOSIS — E119 Type 2 diabetes mellitus without complications: Secondary | ICD-10-CM

## 2013-09-07 DIAGNOSIS — Z0000 Encounter for general adult medical examination without abnormal findings: Secondary | ICD-10-CM

## 2013-09-07 DIAGNOSIS — R030 Elevated blood-pressure reading, without diagnosis of hypertension: Secondary | ICD-10-CM

## 2013-09-07 DIAGNOSIS — Z23 Encounter for immunization: Secondary | ICD-10-CM

## 2013-09-07 MED ORDER — ROSUVASTATIN CALCIUM 5 MG PO TABS: 5.0000 mg | ORAL_TABLET | Freq: Every day | ORAL | Status: DC

## 2013-09-07 MED ORDER — AMLODIPINE BESYLATE 5 MG PO TABS: 5.0000 mg | ORAL_TABLET | Freq: Every day | ORAL | Status: DC

## 2013-09-07 NOTE — Progress Notes (Signed)
Subjective:    Patient ID: [name], female    DOB: 1929-10-13, 79 y.o.   MRN: [id number]  HPI  79 year old White female for health maintenance and evaluation of medical issues. Going to beach for a week. History of HTN, hx of migraine headaches, controlled type 2 diabetes mellitus, hearing loss, irritable bowel syndrome, osteoporosis.   Migraine headaches do not occur as frequently as he used to. She has a history of bilateral hearing loss. Wears bilateral hearing aids.  History of allergic rhinitis. Taked allergy immunizations. Diabetes is diet controlled. Hemoglobin A1c used to run slightly over 6%.  Past Medical History: Fractured right wrist 1994, lymphangitis of the left upper extremity 1997. Total abdominal hysterectomy with BSO,colpopexy for uterine prolapse, cystocele and rectocele repair at [facility] in 2002. Sinus surgery in May 2012. History of positive ANA diagnosed in 2002 with negative anti-DNA. Patient was seen by rheumatologist who doubted that the patient had connective tissue disease. History of urticaria dating back to high school when she was under stress and had recurrence in 2013.  She was [facility] to [facility] regarding intermittent right hearing loss. She saw Dr. Lenoria Farrier and was subsequently referred to a neurologist. She had an MRI of the brain which showed dural enhancement. It was not clear what was causing that condition. She had a spinal tap that was negative. SPEP was negative. Chest x-ray raised a possibility of sarcoidosis. She had a limited CT of the chest to make sure she did not have active sarcoidosis.  Social history: She is a retired Haematologist. Husband is retired from [employer]. She is married and has a high school education. Has a son and a daughter. She currently does not smoke or consume alcohol. 4 smoker.  Family history: Father died at age 55 of heart failure. Mother died at age 14 with history of breast cancer that metastasized to the bone as well as  congestive heart failure.  Patient had tetanus immunization in 2003, Pneumovax immunization September 2004, gets annual influenza immunization. She had colonoscopy 07/12/2008 by Dr. Matthias Hughs showing diverticulosis and he has indicated she should not need further colonoscopies at her age.   Her blood pressure is elevated today but she says it's because she is anxious about getting ready for her trip to the beach. Leaving later this afternoon for a week at the beach. They own a place at [facility].   Review of Systems  Constitutional: Negative.   HENT: Positive for hearing loss.   Eyes: Negative.   Respiratory: Negative.   Cardiovascular: Negative.   Gastrointestinal: Negative.   Endocrine: Negative.   Genitourinary: Negative.   Allergic/Immunologic: Positive for environmental allergies.       History of urticaria  Neurological:       History of migraine headaches much less frequent now  Hematological: Negative.   Psychiatric/Behavioral: Negative.        Objective:   Physical Exam  Vitals reviewed. Constitutional: She is oriented to person, place, and time. She appears well-developed and well-nourished. No distress.  HENT:  Head: Normocephalic and atraumatic.  Right Ear: External ear normal.  Left Ear: External ear normal.  Mouth/Throat: Oropharynx is clear and moist. No oropharyngeal exudate.  Eyes: Conjunctivae and EOM are normal. Pupils are equal, round, and reactive to light. Right eye exhibits no discharge. Left eye exhibits no discharge. No scleral icterus.  Neck: Neck supple. No JVD present. No thyromegaly present.  Cardiovascular: Normal rate, regular rhythm, normal heart sounds and  intact distal pulses.   No murmur heard. Pulmonary/Chest: Effort normal and breath sounds normal. She has no wheezes.  Breasts normal female  Abdominal: Soft. Bowel sounds are normal. She exhibits no distension and no mass. There is no tenderness. There is no rebound and no guarding.   Genitourinary:  Total abdominal hysterectomy and BSO therefore Pap smear nor bimanual exam done  Musculoskeletal: Normal range of motion. She exhibits no edema.  Lymphadenopathy:    She has no cervical adenopathy.  Neurological: She is alert and oriented to person, place, and time. She has normal reflexes. No cranial nerve deficit.  Skin: Skin is warm and dry. No rash noted. She is not diaphoretic.  Psychiatric: She has a normal mood and affect. Her behavior is normal. Judgment and thought content normal.          Assessment & Plan:   Elevated blood pressure today. Hypertension previously well controlled- patient may be excited about impending trip . Continue amlodipine. Watch blood pressure at home.  History migraine headaches-infrequent  Allergic rhinitis -treated by Dr. Maple Hudson  Irritable bowel syndrome   Hyperlipidemia-stable on statin  Diet controlled type 2 diabetes-stable. Hemoglobin A1c 6.4%. Watch diet and exercise.  Return in 6 months.    Subjective:   Patient presents for Medicare Annual/Subsequent preventive examination.   Review Past Medical/Family/Social: no changes   Risk Factors  Current exercise habits: walking Dietary issues discussed: low fat low carb  Cardiac risk factors: HTN, hyperlipidemia  Depression Screen  (Note: if answer to either of the following is "Yes", a more complete depression screening is indicated)   Over the past two weeks, have you felt down, depressed or hopeless? No  Over the past two weeks, have you felt little interest or pleasure in doing things? No Have you lost interest or pleasure in daily life? No Do you often feel hopeless? No Do you cry easily over simple problems? No   Activities of Daily Living  In your present state of health, do you have any difficulty performing the following activities?:   Driving? yes Managing money? No  Feeding yourself? No  Getting from bed to chair? No  Climbing a flight of stairs?  No  Preparing food and eating?: No  Bathing or showering? No  Getting dressed: No  Getting to the toilet? No  Using the toilet:No  Moving around from place to place: No  In the past year have you fallen or had a near fall?:No  Are you sexually active? No  Do you have more than one partner? No   Hearing Difficulties: No  Do you often ask people to speak up or repeat themselves? Yes has bilateral hearing aids Do you experience ringing or noises in your ears? No  Do you have difficulty understanding soft or whispered voices? yes Do you feel that you have a problem with memory? No Do you often misplace items? No    Home Safety:  Do you have a smoke alarm at your residence? Yes Do you have grab bars in the bathroom? no Do you have throw rugs in your house? no   Cognitive Testing  Alert? Yes Normal Appearance?Yes  Oriented to person? Yes Place? Yes  Time? Yes  Recall of three objects? Yes  Can perform simple calculations? Yes  Displays appropriate judgment?Yes  Can read the correct time from a watch face?Yes   List the Names of Other Physician/Practitioners you currently use:  See referral list for the physicians patient is currently seeing.  Dr. Precious Bard optometrist in [place] and Dr. Vonna Kotyk who will do cataract surgery  Right eye in December Dr. Victorio Palm    Review of Systems: see above   Objective:     General appearance: Appears stated age  Head: Normocephalic, without obvious abnormality, atraumatic  Eyes: conj clear, EOMi PEERLA  Ears: normal TM's and external ear canals both ears  Nose: Nares normal. Septum midline. Mucosa normal. No drainage or sinus tenderness.  Throat: lips, mucosa, and tongue normal; teeth and gums normal  Neck: no adenopathy, no carotid bruit, no JVD, supple, symmetrical, trachea midline and thyroid not enlarged, symmetric, no tenderness/mass/nodules  No CVA tenderness.  Lungs: clear to auscultation bilaterally  Breasts: normal  appearance, no masses or tenderness Heart: regular rate and rhythm, S1, S2 normal, no murmur, click, rub or gallop  Abdomen: soft, non-tender; bowel sounds normal; no masses, no organomegaly  Musculoskeletal: ROM normal in all joints, no crepitus, no deformity, Normal muscle strengthen. Back  is symmetric, no curvature. Skin: Skin color, texture, turgor normal. No rashes or lesions  Lymph nodes: Cervical, supraclavicular, and axillary nodes normal.  Neurologic: CN 2 -12 Normal, Normal symmetric reflexes. Normal coordination and gait  Psych: Alert & Oriented x 3, Mood appear stable.    Assessment:    Annual wellness medicare exam   Plan:    During the course of the visit the patient was educated and counseled about appropriate screening and preventive services including:   Annual mammogram- due in November Flu vaccine given Colonoscopy not indicated any longer     Patient Instructions (the written plan) was given to the patient.  Medicare Attestation  I have personally reviewed:  The patient's medical and social history  Their use of alcohol, tobacco or illicit drugs  Their current medications and supplements  The patient's functional ability including ADLs,fall risks, home safety risks, cognitive, and hearing and visual impairment  Diet and physical activities  Evidence for depression or mood disorders  The patient's weight, height, BMI, and visual acuity have been recorded in the chart. I have made referrals, counseling, and provided education to the patient based on review of the above and I have provided the patient with a written personalized care plan for preventive services.

## 2013-09-08 ENCOUNTER — Ambulatory Visit: Payer: Medicare Other

## 2013-09-14 ENCOUNTER — Ambulatory Visit (INDEPENDENT_AMBULATORY_CARE_PROVIDER_SITE_OTHER): Payer: Medicare Other | Admitting: Internal Medicine

## 2013-09-14 ENCOUNTER — Encounter: Payer: Self-pay | Admitting: Internal Medicine

## 2013-09-14 ENCOUNTER — Ambulatory Visit (INDEPENDENT_AMBULATORY_CARE_PROVIDER_SITE_OTHER): Payer: Medicare Other

## 2013-09-14 VITALS — BP 130/78 | HR 92 | Ht 61.0 in | Wt 114.4 lb

## 2013-09-14 DIAGNOSIS — J309 Allergic rhinitis, unspecified: Secondary | ICD-10-CM

## 2013-09-14 DIAGNOSIS — J310 Chronic rhinitis: Secondary | ICD-10-CM

## 2013-09-14 DIAGNOSIS — J301 Allergic rhinitis due to pollen: Secondary | ICD-10-CM

## 2013-09-14 NOTE — Patient Instructions (Signed)
Ok to try the nasal strips to manage the stuffiness when lying on your side at night. If this doesn't help, you could try 1 spray of Afrin or Dristan nasal decongestant, just in the problem nostril, and only when you really need it, so you can sleep.   We can continue allergy vaccine 1:10 GH

## 2013-09-14 NOTE — Progress Notes (Signed)
Patient ID: Anna Ewing, female    DOB: 12/04/1928, 79 y.o.   MRN: [id number]  HPI 02/27/11-82 yoF former smoker, followed for allergic rhinitis. Last here October 30, 2010- reviewed. Allergy vaccine has been advanced successfully to 1:10, GH. Had a cold after Christmas, but no other medical events. She is now noting discomfort blamed on wind and pollen. Wind causes frontal headaches. Blows out little, some postnasal drip.  Using saline nasal rinse.  She had seen Dr Jearld Fenton who did CT sinus and dx'd sinusitis. He gave 10 days antibiotic with little help. She had to reschedule f/u with him. Dr Lenord Fellers gave Z pak yesterday.   07/01/11- 82 yoF former smoker, followed for allergic rhinitis, history of nasal polyps Allergy vaccine now at 1:10 Had succesfull sinus surgery by Dr Jearld Fenton in May- removed nasal polyps and mucus. Still at night she notices some stuffiness lying down. Denies chest tightness or wheeze.  Started using nasal saline and some of her husband's flonase.   12/31/11- 82 yoF former smoker, followed for allergic rhinitis, history of nasal polyps FOLLOWS WUJ:[id number] Ear pain/pressure headaches, stopped up-nasal congestion,cough-productive-yellow x 2 weeks; stil on vaccine  She continues her allergy vaccine without problems. She also uses one to Claritin D. every day and the saline nasal rinse. Flonase did not help. Had sinus surgery in 2012 and did well after that until she had an acute infection last month. She is still blowing yellow nasal mucus. Right ear is stopped up without pop in. Chest is comfortable. Denies sore throat or fever.  06/30/12- 83 yoF former smoker, followed for allergic rhinitis, history of nasal polyps complicated by DM, HBP Headaches and drainage since rain started-otherwise has done well-still on vaccine. Denies problems or concerns w/ allergy vaccine 1;10 GH. Humidity brings nasal congestion, some frontal headache, postnasal drip. Denies SOB, wheeze, purulent or  bloody. Ears ok. Uses flonase and claritin-D.  01/29/13-83 yoF former smoker, followed for allergic rhinitis, history of nasal polyps complicated by DM, HBP FOLLOWS FOR: sneezing,lots of mucus, headaches in past few weeks-"worst its been in a long time"; still on allergy vaccine She blames early spring pollens. Denies headache, purulent nasal discharge, ear discomfort, cough or wheeze. Continues allergy vaccine 1:10 GH and has felt that helped her. Continues Flonase nasal spray.  09/14/13- 83 yoF former smoker, followed for allergic rhinitis, history of nasal polyps complicated by DM, HBP Follows for-routine visit.  Pt c/o sinus congestion R side only at night.  Had flu vaccine Continues allergy vaccine 1:10 GH and wishes to continue Some shifting nasal congestion at night.  ROS- see HPI Constitutional:   No-   weight loss, night sweats, fevers, chills, fatigue, lassitude. HEENT:   No- headaches, difficulty swallowing, tooth/dental problems, sore throat,       +  sneezing, itching, ear ache, + Nasal congestion, post nasal drip,  CV:  No-   chest pain, orthopnea, PND, swelling in lower extremities, anasarca, dizziness, palpitations Resp: No-   shortness of breath with exertion or at rest.              No-   productive cough,  No non-productive cough,  No-  coughing up of blood.              No-   change in color of mucus.  No- wheezing.   Skin: No-   rash or lesions. GI:  No-   heartburn, indigestion, abdominal pain, nausea, vomiting,  GU:  MS:  No-  joint pain or swelling.   Neuro- nothing unusual  Psych:  No- change in mood or affect. No depression or anxiety.  No memory loss Objective:   Physical Exam General- Alert, Oriented, Affect-appropriate, Distress- none acute. Elderly woman who appears comfortable. Skin- rash-none, lesions- none, excoriation- none Lymphadenopathy- none Head- atraumatic            Eyes- Gross vision intact, PERRLA, conjunctivae clear secretions             Ears- Hearing, canals cerumen w/o occlusion            Nose- Clear, +minor Septal dev, mucus,  No- polyps,  no-erosion, perforation             Throat- Mallampati II , mucosa clear ,+mucoid drainage, tonsils- atrophic    Own teeth Neck- flexible , trachea midline, no stridor , thyroid nl, carotid no bruit Chest - symmetrical excursion , unlabored           Heart/CV- RRR , no murmur , no gallop  , no rub, nl s1 s2                           - JVD- none , edema- none, stasis changes- none, varices- none           Lung- clear to P&A, wheeze- none, cough- none , dullness-none, rub- none           Chest wall-  Abd- Br/ Gen/ Rectal- Not done, not indicated Extrem- cyanosis- none, clubbing, none, atrophy- none, strength- nl Neuro- grossly intact to observation

## 2013-09-15 ENCOUNTER — Ambulatory Visit: Payer: Medicare Other

## 2013-09-22 ENCOUNTER — Ambulatory Visit (INDEPENDENT_AMBULATORY_CARE_PROVIDER_SITE_OTHER): Payer: Medicare Other

## 2013-09-22 DIAGNOSIS — J309 Allergic rhinitis, unspecified: Secondary | ICD-10-CM

## 2013-09-27 NOTE — Assessment & Plan Note (Signed)
Try nasal strips at night

## 2013-09-27 NOTE — Assessment & Plan Note (Signed)
Allergy vaccine continues to seem helpful and she is comfortable continuing. We discussed goals, duration and risk.

## 2013-09-29 ENCOUNTER — Ambulatory Visit (INDEPENDENT_AMBULATORY_CARE_PROVIDER_SITE_OTHER): Payer: Medicare Other

## 2013-09-29 DIAGNOSIS — J309 Allergic rhinitis, unspecified: Secondary | ICD-10-CM

## 2013-10-01 ENCOUNTER — Encounter: Payer: Self-pay | Admitting: Internal Medicine

## 2013-10-01 NOTE — Patient Instructions (Signed)
Call if blood pressure persistently elevated. Continue same medications. Otherwise return in 6 months.

## 2013-10-06 ENCOUNTER — Ambulatory Visit (INDEPENDENT_AMBULATORY_CARE_PROVIDER_SITE_OTHER): Payer: Medicare Other

## 2013-10-06 DIAGNOSIS — J309 Allergic rhinitis, unspecified: Secondary | ICD-10-CM

## 2013-10-13 ENCOUNTER — Ambulatory Visit (INDEPENDENT_AMBULATORY_CARE_PROVIDER_SITE_OTHER): Payer: Medicare Other

## 2013-10-13 DIAGNOSIS — J309 Allergic rhinitis, unspecified: Secondary | ICD-10-CM

## 2013-10-20 ENCOUNTER — Ambulatory Visit: Payer: Medicare Other

## 2013-10-21 ENCOUNTER — Ambulatory Visit (INDEPENDENT_AMBULATORY_CARE_PROVIDER_SITE_OTHER): Payer: Medicare Other

## 2013-10-21 DIAGNOSIS — J309 Allergic rhinitis, unspecified: Secondary | ICD-10-CM

## 2013-10-26 ENCOUNTER — Ambulatory Visit (INDEPENDENT_AMBULATORY_CARE_PROVIDER_SITE_OTHER): Payer: Medicare Other

## 2013-10-26 DIAGNOSIS — J309 Allergic rhinitis, unspecified: Secondary | ICD-10-CM

## 2013-10-27 ENCOUNTER — Ambulatory Visit: Payer: Medicare Other

## 2013-10-28 ENCOUNTER — Encounter: Payer: Self-pay | Admitting: Internal Medicine

## 2013-11-03 ENCOUNTER — Ambulatory Visit (INDEPENDENT_AMBULATORY_CARE_PROVIDER_SITE_OTHER): Payer: Medicare Other

## 2013-11-03 DIAGNOSIS — J309 Allergic rhinitis, unspecified: Secondary | ICD-10-CM

## 2013-11-12 ENCOUNTER — Ambulatory Visit (INDEPENDENT_AMBULATORY_CARE_PROVIDER_SITE_OTHER): Payer: Medicare Other

## 2013-11-12 DIAGNOSIS — J309 Allergic rhinitis, unspecified: Secondary | ICD-10-CM

## 2013-11-17 ENCOUNTER — Ambulatory Visit (INDEPENDENT_AMBULATORY_CARE_PROVIDER_SITE_OTHER): Payer: Medicare Other

## 2013-11-17 DIAGNOSIS — J309 Allergic rhinitis, unspecified: Secondary | ICD-10-CM

## 2013-11-24 ENCOUNTER — Ambulatory Visit (INDEPENDENT_AMBULATORY_CARE_PROVIDER_SITE_OTHER): Payer: Medicare Other

## 2013-11-24 DIAGNOSIS — J309 Allergic rhinitis, unspecified: Secondary | ICD-10-CM

## 2013-12-01 ENCOUNTER — Ambulatory Visit: Payer: Medicare Other

## 2013-12-02 ENCOUNTER — Ambulatory Visit (INDEPENDENT_AMBULATORY_CARE_PROVIDER_SITE_OTHER): Payer: Medicare Other

## 2013-12-02 DIAGNOSIS — J309 Allergic rhinitis, unspecified: Secondary | ICD-10-CM

## 2013-12-07 ENCOUNTER — Ambulatory Visit (INDEPENDENT_AMBULATORY_CARE_PROVIDER_SITE_OTHER): Payer: Medicare Other

## 2013-12-07 DIAGNOSIS — J309 Allergic rhinitis, unspecified: Secondary | ICD-10-CM

## 2013-12-08 ENCOUNTER — Ambulatory Visit: Payer: Medicare Other

## 2013-12-14 ENCOUNTER — Ambulatory Visit: Payer: Medicare Other

## 2013-12-22 ENCOUNTER — Ambulatory Visit (INDEPENDENT_AMBULATORY_CARE_PROVIDER_SITE_OTHER): Payer: Medicare Other

## 2013-12-22 DIAGNOSIS — J309 Allergic rhinitis, unspecified: Secondary | ICD-10-CM

## 2013-12-28 ENCOUNTER — Ambulatory Visit (INDEPENDENT_AMBULATORY_CARE_PROVIDER_SITE_OTHER): Payer: Medicare Other

## 2013-12-28 DIAGNOSIS — J309 Allergic rhinitis, unspecified: Secondary | ICD-10-CM

## 2013-12-29 ENCOUNTER — Ambulatory Visit: Payer: Medicare Other

## 2014-01-05 ENCOUNTER — Ambulatory Visit: Payer: Medicare Other

## 2014-01-12 ENCOUNTER — Ambulatory Visit (INDEPENDENT_AMBULATORY_CARE_PROVIDER_SITE_OTHER): Payer: Medicare Other

## 2014-01-12 DIAGNOSIS — J309 Allergic rhinitis, unspecified: Secondary | ICD-10-CM

## 2014-01-19 ENCOUNTER — Ambulatory Visit: Payer: Medicare Other

## 2014-01-25 ENCOUNTER — Ambulatory Visit (INDEPENDENT_AMBULATORY_CARE_PROVIDER_SITE_OTHER): Payer: Medicare Other

## 2014-01-25 DIAGNOSIS — J309 Allergic rhinitis, unspecified: Secondary | ICD-10-CM

## 2014-02-02 ENCOUNTER — Ambulatory Visit (INDEPENDENT_AMBULATORY_CARE_PROVIDER_SITE_OTHER): Payer: Medicare Other

## 2014-02-02 DIAGNOSIS — J309 Allergic rhinitis, unspecified: Secondary | ICD-10-CM

## 2014-02-09 ENCOUNTER — Ambulatory Visit (INDEPENDENT_AMBULATORY_CARE_PROVIDER_SITE_OTHER): Payer: Medicare Other

## 2014-02-09 DIAGNOSIS — J309 Allergic rhinitis, unspecified: Secondary | ICD-10-CM

## 2014-02-14 ENCOUNTER — Ambulatory Visit (INDEPENDENT_AMBULATORY_CARE_PROVIDER_SITE_OTHER): Payer: Medicare Other

## 2014-02-14 DIAGNOSIS — J309 Allergic rhinitis, unspecified: Secondary | ICD-10-CM

## 2014-02-16 ENCOUNTER — Ambulatory Visit (INDEPENDENT_AMBULATORY_CARE_PROVIDER_SITE_OTHER): Payer: Medicare Other

## 2014-02-16 DIAGNOSIS — J309 Allergic rhinitis, unspecified: Secondary | ICD-10-CM

## 2014-02-23 ENCOUNTER — Ambulatory Visit (INDEPENDENT_AMBULATORY_CARE_PROVIDER_SITE_OTHER): Payer: Medicare Other

## 2014-02-23 DIAGNOSIS — J309 Allergic rhinitis, unspecified: Secondary | ICD-10-CM

## 2014-03-02 ENCOUNTER — Ambulatory Visit: Payer: Medicare Other

## 2014-03-10 ENCOUNTER — Ambulatory Visit (INDEPENDENT_AMBULATORY_CARE_PROVIDER_SITE_OTHER): Payer: Medicare Other

## 2014-03-10 DIAGNOSIS — J309 Allergic rhinitis, unspecified: Secondary | ICD-10-CM

## 2014-03-15 ENCOUNTER — Encounter: Payer: Self-pay | Admitting: Internal Medicine

## 2014-03-15 ENCOUNTER — Ambulatory Visit (INDEPENDENT_AMBULATORY_CARE_PROVIDER_SITE_OTHER): Payer: Medicare Other | Admitting: Internal Medicine

## 2014-03-15 ENCOUNTER — Ambulatory Visit (INDEPENDENT_AMBULATORY_CARE_PROVIDER_SITE_OTHER): Payer: Medicare Other

## 2014-03-15 VITALS — BP 122/80 | HR 101 | Ht 61.0 in | Wt 114.8 lb

## 2014-03-15 DIAGNOSIS — J309 Allergic rhinitis, unspecified: Secondary | ICD-10-CM

## 2014-03-15 DIAGNOSIS — J301 Allergic rhinitis due to pollen: Secondary | ICD-10-CM

## 2014-03-15 MED ORDER — AZELASTINE-FLUTICASONE 137-50 MCG/ACT NA SUSP: 1.0000 | NASAL | Status: DC

## 2014-03-15 NOTE — Patient Instructions (Signed)
We can continue allergy vaccine 1:10 GH  Sample Dymista nasal spray    1-2 puffs each nostril once daily at bedtime. Try this for comparison instead of Flonase.

## 2014-03-15 NOTE — Assessment & Plan Note (Signed)
Sensitive to weather changes, suggesting there is non-allergic/ vasomotor component as well as allergy. Plan- continue allergy vaccine for now. Try sample Dymista instead of Flonase

## 2014-03-15 NOTE — Progress Notes (Signed)
Patient ID: [name], female    DOB: 12/30/1928, 80 y.o.   MRN: [id number]  HPI 02/27/11-80 yoF former smoker, followed for allergic rhinitis. Last here October 30, 2010- reviewed. Allergy vaccine has been advanced successfully to 1:10, GH. Had a cold after Christmas, but no other medical events. She is now noting discomfort blamed on wind and pollen. Wind causes frontal headaches. Blows out little, some postnasal drip.  Using saline nasal rinse.  She had seen Dr Jearld FentonByers who did CT sinus and dx'd sinusitis. He gave 10 days antibiotic with little help. She had to reschedule f/u with him. Dr Lenord FellersBaxley gave Z pak yesterday.   07/01/11- 80 yoF former smoker, followed for allergic rhinitis, history of nasal polyps Allergy vaccine now at 1:10 Had succesfull sinus surgery by Dr Jearld FentonByers in May- removed nasal polyps and mucus. Still at night she notices some stuffiness lying down. Denies chest tightness or wheeze.  Started using nasal saline and some of her husband's flonase.   12/31/11- 80 yoF former smoker, followed for allergic rhinitis, history of nasal polyps FOLLOWS WUJ:[id number]:Right Ear pain/pressure headaches, stopped up-nasal congestion,cough-productive-yellow x 2 weeks; stil on vaccine  She continues her allergy vaccine without problems. She also uses one to Claritin D. every day and the saline nasal rinse. Flonase did not help. Had sinus surgery in 2012 and did well after that until she had an acute infection last month. She is still blowing yellow nasal mucus. Right ear is stopped up without pop in. Chest is comfortable. Denies sore throat or fever.  06/30/12- 80 yoF former smoker, followed for allergic rhinitis, history of nasal polyps complicated by DM, HBP Headaches and drainage since rain started-otherwise has done well-still on vaccine. Denies problems or concerns w/ allergy vaccine 1;10 GH. Humidity brings nasal congestion, some frontal headache, postnasal drip. Denies SOB, wheeze, purulent or  bloody. Ears ok. Uses flonase and claritin-D.  01/29/13-80 yoF former smoker, followed for allergic rhinitis, history of nasal polyps complicated by DM, HBP FOLLOWS FOR: sneezing,lots of mucus, headaches in past few weeks-"worst its been in a long time"; still on allergy vaccine She blames early spring pollens. Denies headache, purulent nasal discharge, ear discomfort, cough or wheeze. Continues allergy vaccine 1:10 GH and has felt that helped her. Continues Flonase nasal spray.  09/14/13- 80 yoF former smoker, followed for allergic rhinitis, history of nasal polyps complicated by DM, HBP Follows for-routine visit.  Pt c/o sinus congestion R side only at night.  Had flu vaccine Continues allergy vaccine 1:10 GH and wishes to continue Some shifting nasal congestion at night.  03/15/14-  80 yoF former smoker, followed for allergic rhinitis, history of nasal polyps complicated by DM, HBP FOLLOWS FOR: Allergy Vaccine 1:10 GH doing well.  c/o: some sinus pressure Increased nasal stuffiness, little sneeze or blow. Weather changes trigger symptoms.  Using Flonase and saline rinse. Chest ok.  ROS- see HPI Constitutional:   No-   weight loss, night sweats, fevers, chills, fatigue, lassitude. HEENT:   No- headaches, difficulty swallowing, tooth/dental problems, sore throat,       +  sneezing, itching, ear ache, + Nasal congestion, post nasal drip,  CV:  No-   chest pain, orthopnea, PND, swelling in lower extremities, anasarca, dizziness, palpitations Resp: No-   shortness of breath with exertion or at rest.              No-   productive cough,  No non-productive cough,  No-  coughing up of  blood.              No-   change in color of mucus.  No- wheezing.   Skin: No-   rash or lesions. GI:  No-   heartburn, indigestion, abdominal pain, nausea, vomiting,  GU:  MS:  No-   joint pain or swelling.   Neuro- nothing unusual  Psych:  No- change in mood or affect. No depression or anxiety.  No memory  loss Objective:   Physical Exam General- Alert, Oriented, Affect-appropriate, Distress- none acute. Elderly woman who appears comfortable. Skin- rash-none, lesions- none, excoriation- none Lymphadenopathy- none Head- atraumatic            Eyes- Gross vision intact, PERRLA, conjunctivae clear secretions            Ears- Hearing, canals cerumen w/o occlusion            Nose- Sounds stuffy, unobstructed to exam, +minor Septal dev, mucus,  No- polyps,                        no-erosion, perforation             Throat- Mallampati II , mucosa clear ,+mucoid drainage, tonsils- atrophic    Own teeth Neck- flexible , trachea midline, no stridor , thyroid nl, carotid no bruit Chest - symmetrical excursion , unlabored           Heart/CV- RRR , no murmur , no gallop  , no rub, nl s1 s2                           - JVD- none , edema- none, stasis changes- none, varices- none           Lung- clear to P&A, wheeze- none, cough- none , dullness-none, rub- none           Chest wall-  Abd- Br/ Gen/ Rectal- Not done, not indicated Extrem- cyanosis- none, clubbing, none, atrophy- none, strength- nl Neuro- grossly intact to observation

## 2014-03-22 ENCOUNTER — Encounter: Payer: Self-pay | Admitting: Internal Medicine

## 2014-03-22 ENCOUNTER — Ambulatory Visit (INDEPENDENT_AMBULATORY_CARE_PROVIDER_SITE_OTHER): Payer: Medicare Other

## 2014-03-22 ENCOUNTER — Ambulatory Visit (INDEPENDENT_AMBULATORY_CARE_PROVIDER_SITE_OTHER): Payer: Medicare Other | Admitting: Internal Medicine

## 2014-03-22 VITALS — BP 130/74 | HR 80 | Temp 98.8°F | Wt 112.5 lb

## 2014-03-22 DIAGNOSIS — Z8669 Personal history of other diseases of the nervous system and sense organs: Secondary | ICD-10-CM

## 2014-03-22 DIAGNOSIS — I10 Essential (primary) hypertension: Secondary | ICD-10-CM

## 2014-03-22 DIAGNOSIS — J309 Allergic rhinitis, unspecified: Secondary | ICD-10-CM

## 2014-03-22 DIAGNOSIS — E785 Hyperlipidemia, unspecified: Secondary | ICD-10-CM

## 2014-03-22 DIAGNOSIS — Z79899 Other long term (current) drug therapy: Secondary | ICD-10-CM

## 2014-03-22 LAB — HEPATIC FUNCTION PANEL
ALK PHOS: 49 U/L (ref 39–117)
ALT: 15 U/L (ref 0–35)
AST: 25 U/L (ref 0–37)
Albumin: 4.1 g/dL (ref 3.5–5.2)
BILIRUBIN DIRECT: 0.1 mg/dL (ref 0.0–0.3)
BILIRUBIN INDIRECT: 0.6 mg/dL (ref 0.2–1.2)
TOTAL PROTEIN: 6.7 g/dL (ref 6.0–8.3)
Total Bilirubin: 0.7 mg/dL (ref 0.2–1.2)

## 2014-03-22 LAB — LIPID PANEL
Cholesterol: 161 mg/dL (ref 0–200)
HDL: 74 mg/dL (ref >39)
LDL Cholesterol: 71 mg/dL (ref 0–99)
TRIGLYCERIDES: 78 mg/dL (ref <150)
Total CHOL/HDL Ratio: 2.2 Ratio
VLDL: 16 mg/dL (ref 0–40)

## 2014-03-22 MED ORDER — SUMATRIPTAN SUCCINATE 100 MG PO TABS: ORAL_TABLET | ORAL | Status: DC

## 2014-03-22 NOTE — Addendum Note (Signed)
Addended by: Judy PimpleEILAND, Vere Diantonio M on: 03/22/2014 12:18 PM   Modules accepted: Orders

## 2014-03-22 NOTE — Patient Instructions (Signed)
Continue same medications and return in 6 months for physical examination. Sumatriptan has been refilled

## 2014-03-22 NOTE — Progress Notes (Signed)
   Subjective:    Patient ID: Anna Ewing, female    DOB: 08/31/1929, 80 y.o.   MRN: [id number]  HPI In today for six-month recheck. No new complaints or problems. Recently saw Dr. Maple HudsonYoung for allergy check. Blood pressure is acceptable.  Wants refill on sumatriptan although she very seldom has migraines anymore. She would like to keep some on hand. Fasting lipid panel drawn and is pending.    Review of Systems     Objective:   Physical Exam Neck is supple without JVD thyromegaly or carotid bruits. Chest clear to auscultation. Cardiac exam regular rate and rhythm normal S1 and S2. Extremities without edema. Skin is warm and dry.        Assessment & Plan:  Hypertension-blood pressure stable  Hyperlipidemia-lipid panel pending  History of migraine headaches  Allergic rhinitis-treated with allergy vaccine in followed by Dr. Maple HudsonYoung  Plan: All of these problems are stable and under good control. Return in 6 months for physical examination.

## 2014-03-23 ENCOUNTER — Ambulatory Visit: Payer: Medicare Other

## 2014-03-30 ENCOUNTER — Ambulatory Visit (INDEPENDENT_AMBULATORY_CARE_PROVIDER_SITE_OTHER): Payer: Medicare Other

## 2014-03-30 DIAGNOSIS — J309 Allergic rhinitis, unspecified: Secondary | ICD-10-CM

## 2014-04-06 ENCOUNTER — Ambulatory Visit: Payer: Medicare Other

## 2014-04-13 ENCOUNTER — Ambulatory Visit (INDEPENDENT_AMBULATORY_CARE_PROVIDER_SITE_OTHER): Payer: Medicare Other

## 2014-04-13 DIAGNOSIS — J309 Allergic rhinitis, unspecified: Secondary | ICD-10-CM

## 2014-04-20 ENCOUNTER — Ambulatory Visit (INDEPENDENT_AMBULATORY_CARE_PROVIDER_SITE_OTHER): Payer: Medicare Other

## 2014-04-20 ENCOUNTER — Encounter: Payer: Self-pay | Admitting: Internal Medicine

## 2014-04-20 DIAGNOSIS — J309 Allergic rhinitis, unspecified: Secondary | ICD-10-CM

## 2014-04-27 ENCOUNTER — Ambulatory Visit (INDEPENDENT_AMBULATORY_CARE_PROVIDER_SITE_OTHER): Payer: Medicare Other

## 2014-04-27 DIAGNOSIS — J309 Allergic rhinitis, unspecified: Secondary | ICD-10-CM

## 2014-05-04 ENCOUNTER — Ambulatory Visit: Payer: Medicare Other

## 2014-05-05 ENCOUNTER — Ambulatory Visit (INDEPENDENT_AMBULATORY_CARE_PROVIDER_SITE_OTHER): Payer: Medicare Other

## 2014-05-05 DIAGNOSIS — J309 Allergic rhinitis, unspecified: Secondary | ICD-10-CM

## 2014-05-17 ENCOUNTER — Ambulatory Visit (INDEPENDENT_AMBULATORY_CARE_PROVIDER_SITE_OTHER): Payer: Medicare Other

## 2014-05-17 DIAGNOSIS — J309 Allergic rhinitis, unspecified: Secondary | ICD-10-CM

## 2014-05-18 ENCOUNTER — Ambulatory Visit: Payer: Medicare Other

## 2014-05-25 ENCOUNTER — Ambulatory Visit (INDEPENDENT_AMBULATORY_CARE_PROVIDER_SITE_OTHER): Payer: Medicare Other

## 2014-05-25 DIAGNOSIS — J309 Allergic rhinitis, unspecified: Secondary | ICD-10-CM

## 2014-06-01 ENCOUNTER — Ambulatory Visit (INDEPENDENT_AMBULATORY_CARE_PROVIDER_SITE_OTHER): Payer: Medicare Other

## 2014-06-01 DIAGNOSIS — J309 Allergic rhinitis, unspecified: Secondary | ICD-10-CM

## 2014-06-13 ENCOUNTER — Other Ambulatory Visit: Payer: Self-pay

## 2014-06-13 ENCOUNTER — Telehealth: Payer: Self-pay

## 2014-06-13 MED ORDER — CYCLOBENZAPRINE HCL 10 MG PO TABS: ORAL_TABLET | ORAL | Status: DC

## 2014-06-13 NOTE — Telephone Encounter (Signed)
Will e-scribe Flexeril 10 mg #30 one half to one po qhs.

## 2014-06-13 NOTE — Telephone Encounter (Signed)
Patient if requesting a refill of Flexeril 10mg  that you prescribed a couple years ago. She has back pain and some aching in her legs. Thinks this helped her in the past.

## 2014-06-15 ENCOUNTER — Ambulatory Visit (INDEPENDENT_AMBULATORY_CARE_PROVIDER_SITE_OTHER): Payer: Medicare Other

## 2014-06-15 DIAGNOSIS — J309 Allergic rhinitis, unspecified: Secondary | ICD-10-CM

## 2014-06-21 ENCOUNTER — Ambulatory Visit (INDEPENDENT_AMBULATORY_CARE_PROVIDER_SITE_OTHER): Payer: Medicare Other

## 2014-06-21 DIAGNOSIS — J309 Allergic rhinitis, unspecified: Secondary | ICD-10-CM

## 2014-06-29 ENCOUNTER — Ambulatory Visit (INDEPENDENT_AMBULATORY_CARE_PROVIDER_SITE_OTHER): Payer: Medicare Other

## 2014-06-29 DIAGNOSIS — J309 Allergic rhinitis, unspecified: Secondary | ICD-10-CM

## 2014-07-06 ENCOUNTER — Ambulatory Visit (INDEPENDENT_AMBULATORY_CARE_PROVIDER_SITE_OTHER): Payer: Medicare Other

## 2014-07-06 DIAGNOSIS — J309 Allergic rhinitis, unspecified: Secondary | ICD-10-CM

## 2014-07-11 ENCOUNTER — Ambulatory Visit (INDEPENDENT_AMBULATORY_CARE_PROVIDER_SITE_OTHER): Payer: Medicare Other

## 2014-07-11 DIAGNOSIS — J309 Allergic rhinitis, unspecified: Secondary | ICD-10-CM

## 2014-07-13 ENCOUNTER — Ambulatory Visit: Payer: Medicare Other

## 2014-07-20 ENCOUNTER — Ambulatory Visit (INDEPENDENT_AMBULATORY_CARE_PROVIDER_SITE_OTHER): Payer: Medicare Other

## 2014-07-20 ENCOUNTER — Encounter: Payer: Self-pay | Admitting: Internal Medicine

## 2014-07-20 DIAGNOSIS — J309 Allergic rhinitis, unspecified: Secondary | ICD-10-CM

## 2014-07-27 ENCOUNTER — Ambulatory Visit (INDEPENDENT_AMBULATORY_CARE_PROVIDER_SITE_OTHER): Payer: Medicare Other

## 2014-07-27 DIAGNOSIS — J309 Allergic rhinitis, unspecified: Secondary | ICD-10-CM

## 2014-08-03 ENCOUNTER — Ambulatory Visit (INDEPENDENT_AMBULATORY_CARE_PROVIDER_SITE_OTHER): Payer: Medicare Other

## 2014-08-03 DIAGNOSIS — J309 Allergic rhinitis, unspecified: Secondary | ICD-10-CM

## 2014-08-17 ENCOUNTER — Ambulatory Visit (INDEPENDENT_AMBULATORY_CARE_PROVIDER_SITE_OTHER): Payer: Medicare Other

## 2014-08-17 DIAGNOSIS — J309 Allergic rhinitis, unspecified: Secondary | ICD-10-CM

## 2014-08-24 ENCOUNTER — Ambulatory Visit (INDEPENDENT_AMBULATORY_CARE_PROVIDER_SITE_OTHER): Payer: Medicare Other

## 2014-08-24 DIAGNOSIS — J309 Allergic rhinitis, unspecified: Secondary | ICD-10-CM

## 2014-09-07 ENCOUNTER — Ambulatory Visit (INDEPENDENT_AMBULATORY_CARE_PROVIDER_SITE_OTHER): Payer: Medicare Other

## 2014-09-07 DIAGNOSIS — J309 Allergic rhinitis, unspecified: Secondary | ICD-10-CM

## 2014-09-13 ENCOUNTER — Encounter: Payer: Self-pay | Admitting: Internal Medicine

## 2014-09-13 ENCOUNTER — Ambulatory Visit (INDEPENDENT_AMBULATORY_CARE_PROVIDER_SITE_OTHER): Payer: Medicare Other

## 2014-09-13 ENCOUNTER — Ambulatory Visit (INDEPENDENT_AMBULATORY_CARE_PROVIDER_SITE_OTHER): Payer: Medicare Other | Admitting: Internal Medicine

## 2014-09-13 VITALS — BP 140/74 | HR 88 | Ht 61.0 in | Wt 114.0 lb

## 2014-09-13 DIAGNOSIS — J301 Allergic rhinitis due to pollen: Secondary | ICD-10-CM

## 2014-09-13 DIAGNOSIS — J309 Allergic rhinitis, unspecified: Secondary | ICD-10-CM

## 2014-09-13 NOTE — Progress Notes (Signed)
Patient ID: [name], female    DOB: 06/06/1929, 80 y.o.   MRN: [id number]  HPI 02/27/11-82 yoF former smoker, followed for allergic rhinitis. Last here October 30, 2010- reviewed. Allergy vaccine has been advanced successfully to 1:10, GH. Had a cold after Christmas, but no other medical events. She is now noting discomfort blamed on wind and pollen. Wind causes frontal headaches. Blows out little, some postnasal drip.  Using saline nasal rinse.  She had seen Dr Jearld FentonByers who did CT sinus and dx'd sinusitis. He gave 10 days antibiotic with little help. She had to reschedule f/u with him. Dr Lenord FellersBaxley gave Z pak yesterday.   07/01/11- 82 yoF former smoker, followed for allergic rhinitis, history of nasal polyps Allergy vaccine now at 1:10 Had succesfull sinus surgery by Dr Jearld FentonByers in May- removed nasal polyps and mucus. Still at night she notices some stuffiness lying down. Denies chest tightness or wheeze.  Started using nasal saline and some of her husband's flonase.   12/31/11- 82 yoF former smoker, followed for allergic rhinitis, history of nasal polyps FOLLOWS JXB:JYNWGFOR:Right Ear pain/pressure headaches, stopped up-nasal congestion,cough-productive-yellow x 2 weeks; stil on vaccine  She continues her allergy vaccine without problems. She also uses one to Claritin D. every day and the saline nasal rinse. Flonase did not help. Had sinus surgery in 2012 and did well after that until she had an acute infection last month. She is still blowing yellow nasal mucus. Right ear is stopped up without pop in. Chest is comfortable. Denies sore throat or fever.  06/30/12- 83 yoF former smoker, followed for allergic rhinitis, history of nasal polyps complicated by DM, HBP Headaches and drainage since rain started-otherwise has done well-still on vaccine. Denies problems or concerns w/ allergy vaccine 1;10 GH. Humidity brings nasal congestion, some frontal headache, postnasal drip. Denies SOB, wheeze, purulent or  bloody. Ears ok. Uses flonase and claritin-D.  01/29/13-83 yoF former smoker, followed for allergic rhinitis, history of nasal polyps complicated by DM, HBP FOLLOWS FOR: sneezing,lots of mucus, headaches in past few weeks-"worst its been in a long time"; still on allergy vaccine She blames early spring pollens. Denies headache, purulent nasal discharge, ear discomfort, cough or wheeze. Continues allergy vaccine 1:10 GH and has felt that helped her. Continues Flonase nasal spray.  09/14/13- 83 yoF former smoker, followed for allergic rhinitis, history of nasal polyps complicated by DM, HBP Follows for-routine visit.  Pt c/o sinus congestion R side only at night.  Had flu vaccine Continues allergy vaccine 1:10 GH and wishes to continue Some shifting nasal congestion at night.  03/15/14-  85 yoF former smoker, followed for allergic rhinitis, history of nasal polyps complicated by DM, HBP FOLLOWS FOR: Allergy Vaccine 1:10 GH doing well.  c/o: some sinus pressure Increased nasal stuffiness, little sneeze or blow. Weather changes trigger symptoms.  Using Flonase and saline rinse. Chest ok.  09/13/14- 85 yoF former smoker, followed for allergic rhinitis, history of nasal polyps complicated by DM, HBP FOLLOWS FOR: pt states her Allergy vaccines 1:10 GH are doing well. no breathing complaints at this time .    ROS- see HPI Constitutional:   No-   weight loss, night sweats, fevers, chills, fatigue, lassitude. HEENT:   No- headaches, difficulty swallowing, tooth/dental problems, sore throat,       +  sneezing, itching, ear ache, + Nasal congestion, post nasal drip,  CV:  No-   chest pain, orthopnea, PND, swelling in lower extremities, anasarca, dizziness, palpitations Resp:  No-   shortness of breath with exertion or at rest.              No-   productive cough,  No non-productive cough,  No-  coughing up of blood.              No-   change in color of mucus.  No- wheezing.   Skin: No-   rash or  lesions. GI:  No-   heartburn, indigestion, abdominal pain, nausea, vomiting,  GU:  MS:  No-   joint pain or swelling.   Neuro- nothing unusual  Psych:  No- change in mood or affect. No depression or anxiety.  No memory loss Objective:   Physical Exam General- Alert, Oriented, Affect-appropriate, Distress- none acute. Elderly woman who appears comfortable. Skin- rash-none, lesions- none, excoriation- none Lymphadenopathy- none Head- atraumatic            Eyes- Gross vision intact, PERRLA, conjunctivae clear secretions            Ears- Hearing, canals cerumen w/o occlusion            Nose- Sounds stuffy, unobstructed to exam, +minor Septal dev, mucus,  No- polyps,                        no-erosion, perforation             Throat- Mallampati II , mucosa clear ,+mucoid drainage, tonsils- atrophic    Own teeth Neck- flexible , trachea midline, no stridor , thyroid nl, carotid no bruit Chest - symmetrical excursion , unlabored           Heart/CV- RRR , no murmur , no gallop  , no rub, nl s1 s2                           - JVD- none , edema- none, stasis changes- none, varices- none           Lung- clear to P&A, wheeze- none, cough- none , dullness-none, rub- none           Chest wall-  Abd- Br/ Gen/ Rectal- Not done, not indicated Extrem- cyanosis- none, clubbing, none, atrophy- none, strength- nl Neuro- grossly intact to observation

## 2014-09-13 NOTE — Patient Instructions (Signed)
We can continue allergy vaccine 1:10 GH  Please call as needed 

## 2014-09-20 ENCOUNTER — Ambulatory Visit: Payer: Medicare Other

## 2014-09-21 ENCOUNTER — Ambulatory Visit (INDEPENDENT_AMBULATORY_CARE_PROVIDER_SITE_OTHER): Payer: Medicare Other

## 2014-09-21 DIAGNOSIS — J309 Allergic rhinitis, unspecified: Secondary | ICD-10-CM

## 2014-09-27 ENCOUNTER — Encounter: Payer: Self-pay | Admitting: Internal Medicine

## 2014-09-27 ENCOUNTER — Ambulatory Visit (INDEPENDENT_AMBULATORY_CARE_PROVIDER_SITE_OTHER): Payer: Medicare Other | Admitting: Internal Medicine

## 2014-09-27 ENCOUNTER — Ambulatory Visit (INDEPENDENT_AMBULATORY_CARE_PROVIDER_SITE_OTHER): Payer: Medicare Other

## 2014-09-27 VITALS — BP 150/80 | HR 105 | Temp 98.4°F | Ht 61.0 in | Wt 108.0 lb

## 2014-09-27 DIAGNOSIS — Z961 Presence of intraocular lens: Secondary | ICD-10-CM

## 2014-09-27 DIAGNOSIS — M7918 Myalgia, other site: Secondary | ICD-10-CM

## 2014-09-27 DIAGNOSIS — J0100 Acute maxillary sinusitis, unspecified: Secondary | ICD-10-CM

## 2014-09-27 DIAGNOSIS — Z0000 Encounter for general adult medical examination without abnormal findings: Secondary | ICD-10-CM

## 2014-09-27 DIAGNOSIS — E785 Hyperlipidemia, unspecified: Secondary | ICD-10-CM

## 2014-09-27 DIAGNOSIS — Z23 Encounter for immunization: Secondary | ICD-10-CM

## 2014-09-27 DIAGNOSIS — Z9841 Cataract extraction status, right eye: Secondary | ICD-10-CM

## 2014-09-27 DIAGNOSIS — I10 Essential (primary) hypertension: Secondary | ICD-10-CM

## 2014-09-27 DIAGNOSIS — J309 Allergic rhinitis, unspecified: Secondary | ICD-10-CM

## 2014-09-27 DIAGNOSIS — M791 Myalgia: Secondary | ICD-10-CM

## 2014-09-27 DIAGNOSIS — E119 Type 2 diabetes mellitus without complications: Secondary | ICD-10-CM

## 2014-09-27 DIAGNOSIS — Z8669 Personal history of other diseases of the nervous system and sense organs: Secondary | ICD-10-CM

## 2014-09-27 LAB — CBC WITH DIFFERENTIAL/PLATELET
BASOS PCT: 0 % (ref 0–1)
Basophils Absolute: 0 10*3/uL (ref 0.0–0.1)
EOS PCT: 4 % (ref 0–5)
Eosinophils Absolute: 0.3 10*3/uL (ref 0.0–0.7)
HCT: 36.8 % (ref 36.0–46.0)
Hemoglobin: 12.3 g/dL (ref 12.0–15.0)
LYMPHS ABS: 1.8 10*3/uL (ref 0.7–4.0)
Lymphocytes Relative: 28 % (ref 12–46)
MCH: 30.8 pg (ref 26.0–34.0)
MCHC: 33.4 g/dL (ref 30.0–36.0)
MCV: 92.2 fL (ref 78.0–100.0)
Monocytes Absolute: 0.6 10*3/uL (ref 0.1–1.0)
Monocytes Relative: 10 % (ref 3–12)
Neutro Abs: 3.7 10*3/uL (ref 1.7–7.7)
Neutrophils Relative %: 58 % (ref 43–77)
PLATELETS: 265 10*3/uL (ref 150–400)
RBC: 3.99 MIL/uL (ref 3.87–5.11)
RDW: 14.4 % (ref 11.5–15.5)
WBC: 6.3 10*3/uL (ref 4.0–10.5)

## 2014-09-27 LAB — LIPID PANEL
CHOL/HDL RATIO: 2.9 ratio
CHOLESTEROL: 224 mg/dL — AB (ref 0–200)
HDL: 78 mg/dL (ref >39)
LDL Cholesterol: 133 mg/dL — ABNORMAL HIGH (ref 0–99)
Triglycerides: 67 mg/dL (ref <150)
VLDL: 13 mg/dL (ref 0–40)

## 2014-09-27 LAB — TSH: TSH: 1.455 u[IU]/mL (ref 0.350–4.500)

## 2014-09-27 LAB — POCT URINALYSIS DIPSTICK
BILIRUBIN UA: NEGATIVE
Blood, UA: NEGATIVE
Glucose, UA: NEGATIVE
Ketones, UA: NEGATIVE
Leukocytes, UA: NEGATIVE
Nitrite, UA: NEGATIVE
Protein, UA: NEGATIVE
Urobilinogen, UA: NEGATIVE
pH, UA: 7.5

## 2014-09-27 LAB — COMPREHENSIVE METABOLIC PANEL
ALK PHOS: 47 U/L (ref 39–117)
ALT: 18 U/L (ref 0–35)
AST: 25 U/L (ref 0–37)
Albumin: 4.2 g/dL (ref 3.5–5.2)
BILIRUBIN TOTAL: 0.7 mg/dL (ref 0.2–1.2)
BUN: 6 mg/dL (ref 6–23)
CO2: 27 meq/L (ref 19–32)
CREATININE: 0.48 mg/dL — AB (ref 0.50–1.10)
Calcium: 9.3 mg/dL (ref 8.4–10.5)
Chloride: 97 mEq/L (ref 96–112)
GLUCOSE: 93 mg/dL (ref 70–99)
Potassium: 4.6 mEq/L (ref 3.5–5.3)
Sodium: 134 mEq/L — ABNORMAL LOW (ref 135–145)
Total Protein: 6.8 g/dL (ref 6.0–8.3)

## 2014-09-27 MED ORDER — ACETAMINOPHEN-CODEINE 300-30 MG PO TABS: 1.0000 | ORAL_TABLET | ORAL | Status: DC | PRN

## 2014-09-27 MED ORDER — AZITHROMYCIN 250 MG PO TABS: ORAL_TABLET | ORAL | Status: DC

## 2014-09-27 MED ORDER — CYCLOBENZAPRINE HCL 10 MG PO TABS: ORAL_TABLET | ORAL | Status: DC

## 2014-09-27 MED ORDER — AMLODIPINE BESYLATE 5 MG PO TABS: 5.0000 mg | ORAL_TABLET | Freq: Every day | ORAL | Status: DC

## 2014-09-27 MED ORDER — PREDNISONE 10 MG PO TABS: ORAL_TABLET | ORAL | Status: DC

## 2014-09-28 LAB — HEMOGLOBIN A1C
Hgb A1c MFr Bld: 6.4 % — ABNORMAL HIGH (ref <5.7)
Mean Plasma Glucose: 137 mg/dL — ABNORMAL HIGH (ref <117)

## 2014-09-30 ENCOUNTER — Encounter: Payer: Self-pay | Admitting: Internal Medicine

## 2014-10-12 ENCOUNTER — Ambulatory Visit (INDEPENDENT_AMBULATORY_CARE_PROVIDER_SITE_OTHER): Payer: Medicare Other

## 2014-10-12 DIAGNOSIS — J309 Allergic rhinitis, unspecified: Secondary | ICD-10-CM

## 2014-10-19 ENCOUNTER — Ambulatory Visit (INDEPENDENT_AMBULATORY_CARE_PROVIDER_SITE_OTHER): Payer: Medicare Other

## 2014-10-19 ENCOUNTER — Ambulatory Visit (INDEPENDENT_AMBULATORY_CARE_PROVIDER_SITE_OTHER): Payer: Medicare Other | Admitting: Internal Medicine

## 2014-10-19 DIAGNOSIS — Z23 Encounter for immunization: Secondary | ICD-10-CM

## 2014-10-19 DIAGNOSIS — J309 Allergic rhinitis, unspecified: Secondary | ICD-10-CM

## 2014-10-19 MED ORDER — PNEUMOCOCCAL 13-VAL CONJ VACC IM SUSP
0.5000 mL | Freq: Once | INTRAMUSCULAR | Status: AC
  Administered 2014-10-19: 0.5 mL via INTRAMUSCULAR

## 2014-10-26 ENCOUNTER — Ambulatory Visit (INDEPENDENT_AMBULATORY_CARE_PROVIDER_SITE_OTHER): Payer: Medicare Other

## 2014-10-26 DIAGNOSIS — J309 Allergic rhinitis, unspecified: Secondary | ICD-10-CM

## 2014-11-02 ENCOUNTER — Ambulatory Visit (INDEPENDENT_AMBULATORY_CARE_PROVIDER_SITE_OTHER): Payer: Medicare Other

## 2014-11-02 DIAGNOSIS — J309 Allergic rhinitis, unspecified: Secondary | ICD-10-CM

## 2014-11-09 ENCOUNTER — Ambulatory Visit (INDEPENDENT_AMBULATORY_CARE_PROVIDER_SITE_OTHER): Payer: Medicare Other

## 2014-11-09 DIAGNOSIS — J309 Allergic rhinitis, unspecified: Secondary | ICD-10-CM

## 2014-11-14 ENCOUNTER — Encounter: Payer: Self-pay | Admitting: Internal Medicine

## 2014-11-16 ENCOUNTER — Ambulatory Visit (INDEPENDENT_AMBULATORY_CARE_PROVIDER_SITE_OTHER): Payer: Medicare Other

## 2014-11-16 DIAGNOSIS — J309 Allergic rhinitis, unspecified: Secondary | ICD-10-CM

## 2014-11-23 ENCOUNTER — Ambulatory Visit (INDEPENDENT_AMBULATORY_CARE_PROVIDER_SITE_OTHER): Payer: Medicare Other

## 2014-11-23 ENCOUNTER — Encounter: Payer: Self-pay | Admitting: Internal Medicine

## 2014-11-23 DIAGNOSIS — J309 Allergic rhinitis, unspecified: Secondary | ICD-10-CM

## 2014-11-28 ENCOUNTER — Ambulatory Visit (INDEPENDENT_AMBULATORY_CARE_PROVIDER_SITE_OTHER): Payer: Medicare Other

## 2014-11-28 DIAGNOSIS — J309 Allergic rhinitis, unspecified: Secondary | ICD-10-CM

## 2014-11-30 ENCOUNTER — Ambulatory Visit: Payer: Self-pay

## 2014-12-07 ENCOUNTER — Ambulatory Visit (INDEPENDENT_AMBULATORY_CARE_PROVIDER_SITE_OTHER): Payer: Medicare Other

## 2014-12-07 DIAGNOSIS — J309 Allergic rhinitis, unspecified: Secondary | ICD-10-CM | POA: Diagnosis not present

## 2014-12-12 ENCOUNTER — Ambulatory Visit (INDEPENDENT_AMBULATORY_CARE_PROVIDER_SITE_OTHER): Payer: Medicare Other

## 2014-12-12 DIAGNOSIS — J309 Allergic rhinitis, unspecified: Secondary | ICD-10-CM | POA: Diagnosis not present

## 2014-12-14 ENCOUNTER — Ambulatory Visit (INDEPENDENT_AMBULATORY_CARE_PROVIDER_SITE_OTHER): Payer: Medicare Other

## 2014-12-14 DIAGNOSIS — J309 Allergic rhinitis, unspecified: Secondary | ICD-10-CM | POA: Diagnosis not present

## 2014-12-19 DIAGNOSIS — J209 Acute bronchitis, unspecified: Secondary | ICD-10-CM | POA: Diagnosis not present

## 2014-12-19 DIAGNOSIS — J0100 Acute maxillary sinusitis, unspecified: Secondary | ICD-10-CM | POA: Diagnosis not present

## 2014-12-19 DIAGNOSIS — R062 Wheezing: Secondary | ICD-10-CM | POA: Diagnosis not present

## 2014-12-21 ENCOUNTER — Ambulatory Visit (INDEPENDENT_AMBULATORY_CARE_PROVIDER_SITE_OTHER): Payer: Medicare Other

## 2014-12-21 DIAGNOSIS — J309 Allergic rhinitis, unspecified: Secondary | ICD-10-CM

## 2015-01-04 ENCOUNTER — Ambulatory Visit (INDEPENDENT_AMBULATORY_CARE_PROVIDER_SITE_OTHER): Payer: Medicare Other

## 2015-01-04 DIAGNOSIS — J309 Allergic rhinitis, unspecified: Secondary | ICD-10-CM | POA: Diagnosis not present

## 2015-01-08 ENCOUNTER — Encounter: Payer: Self-pay | Admitting: Internal Medicine

## 2015-01-08 NOTE — Patient Instructions (Signed)
Takes Z-Pak in tapering dose of prednisone. Return in 6 months. Continue same medications. Diet and exercise.

## 2015-01-08 NOTE — Progress Notes (Signed)
Subjective:    Patient ID: [name], female    DOB: 10-May-1929, 81 y.o.   MRN: [id number]  HPI 81 year old White Female in today for health maintenance exam and evaluation of medical issues. History of hypertension, hyperlipidemia, allergic rhinitis, migraine headaches, controlled type 2 diabetes mellitus, hearing loss, irritable bowel syndrome, osteoporosis.  Migraine headaches incur infrequently. History of bilateral hearing loss and wears bilateral hearing aids. History of allergic rhinitis. Has taken allergy immunizations. Diabetes is diet controlled.  Past medical history: Fractured right wrist 1994, lymphangitis of the left upper extremity 1997, total of gout will hysterectomy with BSO, colpopexy for uterine prolapse, cystocele and rectocele repair at [facility] in 2002. Sinus surgery in May 2012.  History of positive ANA diagnosed in 2002 with negative anti-DNA. Was seen by rheumatologist to did not think patient had connective tissue disease.  History of urticaria dating back to high school when she was under stress. Had recurrence in 2013.  She was sent to [facility] regarding intermittent right hearing loss. She saw Dr. Lenoria Farrier. Was subsequently referred to neurologist. Had MRI of the brain which showed dural enhancement. It was not clear what was causing that condition. Had negative spinal tap. SPEP was negative. Chest x-ray raise possibility of sarcoidosis. She had a limited CT of the chest that did not show active sarcoidosis.  History of cataract extraction right eye in November 2014 by Dr. Vonna Kotyk. Dr. Precious Bard, optometrist in [place] is regular eye physician.  Social history: She is retired Haematologist. Husband is retired from [employer]. She has a high school education. She has a son and a daughter. She does not smoke or consume alcohol. She is a former smoker.  Family history: Father died at age 20 of heart failure. Mother died at age 18 with history of breast cancer that  metastasized to the bone as well as congestive heart failure.  Patient had colonoscopy in 2009 by Dr. Matthias Hughs showing diverticulosis. He has indicated she should not have further colonoscopies at her age.  Tetanus immunization up-to-date. Pneumovax immunization 2004.  New complaint today is acute upper respiratory infection with maxillary sinus pressure.    Review of Systems  Constitutional: Negative.   HENT: Positive for congestion and sinus pressure.   Eyes: Negative.   Respiratory: Negative.   Cardiovascular: Negative.   Gastrointestinal:       History of irritable bowel syndrome  Endocrine:       History of diet-controlled diabetes  Allergic/Immunologic: Positive for environmental allergies.  Neurological: Negative.        History of infrequent migraine headaches treated with triptan medication  Hematological: Negative.   Psychiatric/Behavioral: Negative.        Objective:   Physical Exam  Constitutional: She is oriented to person, place, and time. She appears well-developed and well-nourished. No distress.  HENT:  Head: Normocephalic and atraumatic.  Right Ear: External ear normal.  Left Ear: External ear normal.  Mouth/Throat: Oropharynx is clear and moist. No oropharyngeal exudate.  Eyes: Conjunctivae are normal. Pupils are equal, round, and reactive to light. Right eye exhibits no discharge. Left eye exhibits no discharge.  Neck: Neck supple. No JVD present. No thyromegaly present.  Cardiovascular: Normal rate, regular rhythm and normal heart sounds.   No murmur heard. Pulmonary/Chest: Effort normal and breath sounds normal. No respiratory distress. She has no wheezes. She has no rales. She exhibits no tenderness.  Abdominal: She exhibits no distension and no mass. There is no tenderness. There is no  rebound and no guarding.  Genitourinary:  Deferred due to history of hysterectomy/BSO  Musculoskeletal: She exhibits no edema.  Neurological: She is alert and  oriented to person, place, and time. She has normal reflexes. No cranial nerve deficit. Coordination normal.  Skin: Skin is warm and dry. No rash noted. She is not diaphoretic.  Psychiatric: She has a normal mood and affect. Her behavior is normal. Judgment and thought content normal.  Vitals reviewed.         Assessment & Plan:   Hypertension-blood pressure is elevated today off think because she has an acute respiratory infection. She will monitor blood pressure at home and let me know if persistently elevated.  History of migraine headaches-infrequent control with triptan medication  Allergic rhinitis-treated by Dr. Maple Hudson  History of irritable bowel syndrome  Hyperlipidemia treated on statin medication-elevated LDL cholesterol. Watch diet and exercise  Diet-controlled type 2 diabetes-hemoglobin A1c 6.4%  Acute sinusitis-treat with prednisone taper and Z-Pak  Plan: Continue same medications and return in 6 months.       Subjective:   Patient presents for Medicare Annual/Subsequent preventive examination.  Review Past Medical/Family/Social: See above  Risk Factors  Current exercise habits: walking Dietary issues discussed: Low fat low carb  Cardiac risk factors: Hypertension, hyperlipidemia, family history of congestive heart failure and father  Depression Screen  (Note: if answer to either of the following is "Yes", a more complete depression screening is indicated)   Over the past two weeks, have you felt down, depressed or hopeless? No  Over the past two weeks, have you felt little interest or pleasure in doing things? No Have you lost interest or pleasure in daily life? No Do you often feel hopeless? No Do you cry easily over simple problems? No   Activities of Daily Living  In your present state of health, do you have any difficulty performing the following activities?:   Driving? No  Managing money? No  Feeding yourself? No  Getting from bed to  chair? No  Climbing a flight of stairs? No  Preparing food and eating?: No  Bathing or showering? No  Getting dressed: No  Getting to the toilet? No  Using the toilet:No  Moving around from place to place: No  In the past year have you fallen or had a near fall?:No  Are you sexually active? No  Do you have more than one partner? No   Hearing Difficulties: No  Do you often ask people to speak up or repeat themselves? Yes Do you experience ringing or noises in your ears? Yes Do you have difficulty understanding soft or whispered voices? Yes Do you feel that you have a problem with memory? No Do you often misplace items? No    Home Safety:  Do you have a smoke alarm at your residence? Yes Do you have grab bars in the bathroom? No Do you have throw rugs in your house? No   Cognitive Testing  Alert? Yes Normal Appearance?Yes  Oriented to person? Yes Place? Yes  Time? Yes  Recall of three objects? Yes  Can perform simple calculations? Yes  Displays appropriate judgment?Yes  Can read the correct time from a watch face?Yes   List the Names of Other Physician/Practitioners you currently use:  See referral list for the physicians patient is currently seeing.  Dr. Victorio Palm   Review of Systems: See above   Objective:     General appearance: Appears stated age  Head: Normocephalic, without obvious abnormality, atraumatic  Eyes: conj clear, EOMi PEERLA  Ears: normal TM's and external ear canals both ears  Nose: Nares normal. Septum midline. Mucosa normal. No drainage or sinus tenderness.  Throat: lips, mucosa, and tongue normal; teeth and gums normal  Neck: no adenopathy, no carotid bruit, no JVD, supple, symmetrical, trachea midline and thyroid not enlarged, symmetric, no tenderness/mass/nodules  No CVA tenderness.  Lungs: clear to auscultation bilaterally  Breasts: normal appearance, no masses or tenderness Heart: regular rate and rhythm, S1, S2 normal, no murmur,  click, rub or gallop  Abdomen: soft, non-tender; bowel sounds normal; no masses, no organomegaly  Musculoskeletal: ROM normal in all joints, no crepitus, no deformity, Normal muscle strengthen. Back  is symmetric, no curvature. Skin: Skin color, texture, turgor normal. No rashes or lesions  Lymph nodes: Cervical, supraclavicular, and axillary nodes normal.  Neurologic: CN 2 -12 Normal, Normal symmetric reflexes. Normal coordination and gait  Psych: Alert & Oriented x 3, Mood appear stable.    Assessment:    Annual wellness medicare exam   Plan:    During the course of the visit the patient was educated and counseled about appropriate screening and preventive services including:   Annual flu vaccine       Patient Instructions (the written plan) was given to the patient.  Medicare Attestation  I have personally reviewed:  The patient's medical and social history  Their use of alcohol, tobacco or illicit drugs  Their current medications and supplements  The patient's functional ability including ADLs,fall risks, home safety risks, cognitive, and hearing and visual impairment  Diet and physical activities  Evidence for depression or mood disorders  The patient's weight, height, BMI, and visual acuity have been recorded in the chart. I have made referrals, counseling, and provided education to the patient based on review of the above and I have provided the patient with a written personalized care plan for preventive services.

## 2015-01-11 ENCOUNTER — Ambulatory Visit (INDEPENDENT_AMBULATORY_CARE_PROVIDER_SITE_OTHER): Payer: Medicare Other

## 2015-01-11 DIAGNOSIS — J309 Allergic rhinitis, unspecified: Secondary | ICD-10-CM | POA: Diagnosis not present

## 2015-01-18 ENCOUNTER — Ambulatory Visit (INDEPENDENT_AMBULATORY_CARE_PROVIDER_SITE_OTHER): Payer: Medicare Other

## 2015-01-18 DIAGNOSIS — J309 Allergic rhinitis, unspecified: Secondary | ICD-10-CM

## 2015-01-23 DIAGNOSIS — J0100 Acute maxillary sinusitis, unspecified: Secondary | ICD-10-CM | POA: Diagnosis not present

## 2015-01-23 DIAGNOSIS — J309 Allergic rhinitis, unspecified: Secondary | ICD-10-CM | POA: Diagnosis not present

## 2015-01-25 ENCOUNTER — Ambulatory Visit (INDEPENDENT_AMBULATORY_CARE_PROVIDER_SITE_OTHER): Payer: Medicare Other

## 2015-01-25 DIAGNOSIS — J309 Allergic rhinitis, unspecified: Secondary | ICD-10-CM | POA: Diagnosis not present

## 2015-01-27 ENCOUNTER — Encounter: Payer: Self-pay | Admitting: Internal Medicine

## 2015-02-01 ENCOUNTER — Ambulatory Visit (INDEPENDENT_AMBULATORY_CARE_PROVIDER_SITE_OTHER): Payer: Medicare Other

## 2015-02-01 DIAGNOSIS — J309 Allergic rhinitis, unspecified: Secondary | ICD-10-CM | POA: Diagnosis not present

## 2015-02-08 ENCOUNTER — Ambulatory Visit (INDEPENDENT_AMBULATORY_CARE_PROVIDER_SITE_OTHER): Payer: Medicare Other

## 2015-02-08 DIAGNOSIS — J309 Allergic rhinitis, unspecified: Secondary | ICD-10-CM | POA: Diagnosis not present

## 2015-02-15 ENCOUNTER — Ambulatory Visit (INDEPENDENT_AMBULATORY_CARE_PROVIDER_SITE_OTHER): Payer: Medicare Other

## 2015-02-15 DIAGNOSIS — J309 Allergic rhinitis, unspecified: Secondary | ICD-10-CM

## 2015-02-22 ENCOUNTER — Ambulatory Visit (INDEPENDENT_AMBULATORY_CARE_PROVIDER_SITE_OTHER): Payer: Medicare Other

## 2015-02-22 DIAGNOSIS — J309 Allergic rhinitis, unspecified: Secondary | ICD-10-CM | POA: Diagnosis not present

## 2015-02-28 ENCOUNTER — Ambulatory Visit (INDEPENDENT_AMBULATORY_CARE_PROVIDER_SITE_OTHER): Payer: Medicare Other

## 2015-02-28 DIAGNOSIS — J309 Allergic rhinitis, unspecified: Secondary | ICD-10-CM

## 2015-03-07 ENCOUNTER — Ambulatory Visit: Payer: Medicare Other

## 2015-03-08 ENCOUNTER — Ambulatory Visit (INDEPENDENT_AMBULATORY_CARE_PROVIDER_SITE_OTHER): Payer: Medicare Other

## 2015-03-08 DIAGNOSIS — J309 Allergic rhinitis, unspecified: Secondary | ICD-10-CM | POA: Diagnosis not present

## 2015-03-15 ENCOUNTER — Ambulatory Visit (INDEPENDENT_AMBULATORY_CARE_PROVIDER_SITE_OTHER): Payer: Medicare Other

## 2015-03-15 ENCOUNTER — Encounter: Payer: Self-pay | Admitting: Internal Medicine

## 2015-03-15 ENCOUNTER — Ambulatory Visit (INDEPENDENT_AMBULATORY_CARE_PROVIDER_SITE_OTHER): Payer: Medicare Other | Admitting: Internal Medicine

## 2015-03-15 VITALS — BP 118/66 | HR 90 | Ht 61.0 in | Wt 112.4 lb

## 2015-03-15 DIAGNOSIS — J310 Chronic rhinitis: Secondary | ICD-10-CM

## 2015-03-15 DIAGNOSIS — J309 Allergic rhinitis, unspecified: Secondary | ICD-10-CM | POA: Diagnosis not present

## 2015-03-15 NOTE — Patient Instructions (Addendum)
We can continue allergy vaccine 1:10 GH  Sample Dymista nasal spray  1-2 puffs each nostril once daily at bedtime  Please call as needed

## 2015-03-15 NOTE — Progress Notes (Signed)
Patient ID: [name], female    DOB: 09/27/1929, 81 y.o.   MRN: [id number]  HPI 02/27/11-82 yoF former smoker, followed for allergic rhinitis. Last here October 30, 2010- reviewed. Allergy vaccine has been advanced successfully to 1:10, GH. Had a cold after Christmas, but no other medical events. She is now noting discomfort blamed on wind and pollen. Wind causes frontal headaches. Blows out little, some postnasal drip.  Using saline nasal rinse.  She had seen Dr Jearld Fenton who did CT sinus and dx'd sinusitis. He gave 10 days antibiotic with little help. She had to reschedule f/u with him. Dr Lenord Fellers gave Z pak yesterday.   07/01/11- 82 yoF former smoker, followed for allergic rhinitis, history of nasal polyps Allergy vaccine now at 1:10 Had succesfull sinus surgery by Dr Jearld Fenton in May- removed nasal polyps and mucus. Still at night she notices some stuffiness lying down. Denies chest tightness or wheeze.  Started using nasal saline and some of her husband's flonase.   12/31/11- 82 yoF former smoker, followed for allergic rhinitis, history of nasal polyps FOLLOWS NWG:NFAOZ Ear pain/pressure headaches, stopped up-nasal congestion,cough-productive-yellow x 2 weeks; stil on vaccine  She continues her allergy vaccine without problems. She also uses one to Claritin D. every day and the saline nasal rinse. Flonase did not help. Had sinus surgery in 2012 and did well after that until she had an acute infection last month. She is still blowing yellow nasal mucus. Right ear is stopped up without pop in. Chest is comfortable. Denies sore throat or fever.  06/30/12- 83 yoF former smoker, followed for allergic rhinitis, history of nasal polyps complicated by DM, HBP Headaches and drainage since rain started-otherwise has done well-still on vaccine. Denies problems or concerns w/ allergy vaccine 1;10 GH. Humidity brings nasal congestion, some frontal headache, postnasal drip. Denies SOB, wheeze, purulent or  bloody. Ears ok. Uses flonase and claritin-D.  01/29/13-83 yoF former smoker, followed for allergic rhinitis, history of nasal polyps complicated by DM, HBP FOLLOWS FOR: sneezing,lots of mucus, headaches in past few weeks-"worst its been in a long time"; still on allergy vaccine She blames early spring pollens. Denies headache, purulent nasal discharge, ear discomfort, cough or wheeze. Continues allergy vaccine 1:10 GH and has felt that helped her. Continues Flonase nasal spray.  09/14/13- 83 yoF former smoker, followed for allergic rhinitis, history of nasal polyps complicated by DM, HBP Follows for-routine visit.  Pt c/o sinus congestion R side only at night.  Had flu vaccine Continues allergy vaccine 1:10 GH and wishes to continue Some shifting nasal congestion at night.  03/15/14-  85 yoF former smoker, followed for allergic rhinitis, history of nasal polyps complicated by DM, HBP FOLLOWS FOR: Allergy Vaccine 1:10 GH doing well.  c/o: some sinus pressure Increased nasal stuffiness, little sneeze or blow. Weather changes trigger symptoms.  Using Flonase and saline rinse. Chest ok.  09/13/14- 85 yoF former smoker, followed for allergic rhinitis, history of nasal polyps complicated by DM, HBP FOLLOWS FOR: pt states her Allergy vaccines 1:10 GH are doing well. no breathing complaints at this time .  03/15/15- 85 yoF former smoker, followed for allergic rhinitis, history of nasal polyps complicated by DM, HBP FOLLOWS FOR: Still on allergy vaccine 1:10 and doing well; has been blowing nose since Feb 2016-clear in color.   ROS- see HPI Constitutional:   No-   weight loss, night sweats, fevers, chills, fatigue, lassitude. HEENT:   No- headaches, difficulty swallowing, tooth/dental problems, sore throat,       +  sneezing, itching, ear ache, + Nasal congestion, post nasal drip,  CV:  No-   chest pain, orthopnea, PND, swelling in lower extremities, anasarca, dizziness, palpitations Resp: No-    shortness of breath with exertion or at rest.              No-   productive cough,  No non-productive cough,  No-  coughing up of blood.              No-   change in color of mucus.  No- wheezing.   Skin: No-   rash or lesions. GI:  No-   heartburn, indigestion, abdominal pain, nausea, vomiting,  GU:  MS:  No-   joint pain or swelling.   Neuro- nothing unusual  Psych:  No- change in mood or affect. No depression or anxiety.  No memory loss Objective:   Physical Exam General- Alert, Oriented, Affect-appropriate, Distress- none acute. Elderly woman who appears comfortable. Skin- rash-none, lesions- none, excoriation- none Lymphadenopathy- none Head- atraumatic            Eyes- Gross vision intact, PERRLA, conjunctivae clear secretions            Ears- Hearing, canals cerumen w/o occlusion            Nose- Sounds stuffy, unobstructed to exam, +minor Septal dev, mucus,  No- polyps,                        no-erosion, perforation             Throat- Mallampati II , mucosa clear ,+mucoid drainage, tonsils- atrophic    Own teeth Neck- flexible , trachea midline, no stridor , thyroid nl, carotid no bruit Chest - symmetrical excursion , unlabored           Heart/CV- RRR , no murmur , no gallop  , no rub, nl s1 s2                           - JVD- none , edema- none, stasis changes- none, varices- none           Lung- clear to P&A, wheeze- none, cough- none , dullness-none, rub- none           Chest wall-  Abd- Br/ Gen/ Rectal- Not done, not indicated Extrem- cyanosis- none, clubbing, none, atrophy- none, strength- nl Neuro- grossly intact to observation

## 2015-03-21 ENCOUNTER — Ambulatory Visit (INDEPENDENT_AMBULATORY_CARE_PROVIDER_SITE_OTHER): Payer: Medicare Other

## 2015-03-21 ENCOUNTER — Other Ambulatory Visit: Payer: Medicare Other | Admitting: Internal Medicine

## 2015-03-21 DIAGNOSIS — E119 Type 2 diabetes mellitus without complications: Secondary | ICD-10-CM

## 2015-03-21 DIAGNOSIS — J309 Allergic rhinitis, unspecified: Secondary | ICD-10-CM | POA: Diagnosis not present

## 2015-03-21 DIAGNOSIS — E785 Hyperlipidemia, unspecified: Secondary | ICD-10-CM | POA: Diagnosis not present

## 2015-03-21 LAB — LIPID PANEL
Cholesterol: 218 mg/dL — ABNORMAL HIGH (ref 0–200)
HDL: 72 mg/dL (ref >=46)
LDL Cholesterol: 129 mg/dL — ABNORMAL HIGH (ref 0–99)
Total CHOL/HDL Ratio: 3 Ratio
Triglycerides: 87 mg/dL (ref <150)
VLDL: 17 mg/dL (ref 0–40)

## 2015-03-22 LAB — HEMOGLOBIN A1C
Hgb A1c MFr Bld: 6.5 % — ABNORMAL HIGH (ref <5.7)
Mean Plasma Glucose: 140 mg/dL — ABNORMAL HIGH (ref <117)

## 2015-03-23 ENCOUNTER — Ambulatory Visit (INDEPENDENT_AMBULATORY_CARE_PROVIDER_SITE_OTHER): Payer: Medicare Other | Admitting: Internal Medicine

## 2015-03-23 ENCOUNTER — Encounter: Payer: Self-pay | Admitting: Internal Medicine

## 2015-03-23 VITALS — BP 142/82 | HR 89 | Temp 97.6°F | Wt 113.0 lb

## 2015-03-23 DIAGNOSIS — I10 Essential (primary) hypertension: Secondary | ICD-10-CM

## 2015-03-23 DIAGNOSIS — E119 Type 2 diabetes mellitus without complications: Secondary | ICD-10-CM | POA: Diagnosis not present

## 2015-03-23 DIAGNOSIS — E785 Hyperlipidemia, unspecified: Secondary | ICD-10-CM | POA: Diagnosis not present

## 2015-03-23 NOTE — Addendum Note (Signed)
Addended by: Thomasena EdisWILLIAMS, Jaylea Plourde N on: 03/23/2015 10:53 AM   Modules accepted: Orders

## 2015-03-23 NOTE — Progress Notes (Signed)
   Subjective:    Patient ID: Anna Ewing, female    DOB: 11/26/1928, 81 y.o.   MRN: [id number]  HPI  Patient in today for six-month recheck on hypertension, hyperlipidemia, type 2 diabetes mellitus. She's doing well. No recent headaches. Husband had shoulder surgery and she has a new great-granddaughter. [facility].  Patient has regular eye exams. No trouble with feet. Hemoglobin A1c 6.5%. Unable to tolerate statin medication due to myalgias. Therefore just controlling lipids with diet alone.  Review of Systems     Objective:   Physical Exam  Skin warm and dry. Nodes none. Neck supple without JVD thyromegaly or carotid bruits. Chest clear. Cardiac exam regular rate and rhythm normal S1 and S2.      Assessment & Plan:  Hyperlipidemia-stable off statin medication  Type 2 diabetes mellitus-11 A1c 6.5%  Hypertension-stable on current regimen  Plan: Patient due for physical examination after 09/28/2015. We'll book today. Continue same medications

## 2015-03-23 NOTE — Patient Instructions (Signed)
Continue same medications. Watch diet and exercise. Return after November 10 for physical examination.

## 2015-03-24 LAB — MICROALBUMIN / CREATININE URINE RATIO
Creatinine, Urine: 23 mg/dL
Microalb, Ur: <0.2 mg/dL (ref <2.0)

## 2015-03-28 ENCOUNTER — Ambulatory Visit (INDEPENDENT_AMBULATORY_CARE_PROVIDER_SITE_OTHER): Payer: Medicare Other

## 2015-03-28 DIAGNOSIS — J309 Allergic rhinitis, unspecified: Secondary | ICD-10-CM

## 2015-04-05 ENCOUNTER — Ambulatory Visit (INDEPENDENT_AMBULATORY_CARE_PROVIDER_SITE_OTHER): Payer: Medicare Other

## 2015-04-05 DIAGNOSIS — J309 Allergic rhinitis, unspecified: Secondary | ICD-10-CM | POA: Diagnosis not present

## 2015-04-19 ENCOUNTER — Ambulatory Visit (INDEPENDENT_AMBULATORY_CARE_PROVIDER_SITE_OTHER): Payer: Medicare Other

## 2015-04-19 DIAGNOSIS — J309 Allergic rhinitis, unspecified: Secondary | ICD-10-CM | POA: Diagnosis not present

## 2015-04-25 ENCOUNTER — Ambulatory Visit (INDEPENDENT_AMBULATORY_CARE_PROVIDER_SITE_OTHER): Payer: Medicare Other

## 2015-04-25 DIAGNOSIS — J309 Allergic rhinitis, unspecified: Secondary | ICD-10-CM

## 2015-04-25 DIAGNOSIS — J0100 Acute maxillary sinusitis, unspecified: Secondary | ICD-10-CM | POA: Diagnosis not present

## 2015-04-26 ENCOUNTER — Ambulatory Visit: Payer: Medicare Other

## 2015-05-04 ENCOUNTER — Telehealth: Payer: Self-pay | Admitting: Internal Medicine

## 2015-05-04 ENCOUNTER — Ambulatory Visit (INDEPENDENT_AMBULATORY_CARE_PROVIDER_SITE_OTHER): Payer: Medicare Other

## 2015-05-04 DIAGNOSIS — J309 Allergic rhinitis, unspecified: Secondary | ICD-10-CM

## 2015-05-10 ENCOUNTER — Ambulatory Visit (INDEPENDENT_AMBULATORY_CARE_PROVIDER_SITE_OTHER): Payer: Medicare Other

## 2015-05-10 DIAGNOSIS — J309 Allergic rhinitis, unspecified: Secondary | ICD-10-CM | POA: Diagnosis not present

## 2015-05-12 ENCOUNTER — Encounter: Payer: Self-pay | Admitting: Internal Medicine

## 2015-05-24 ENCOUNTER — Ambulatory Visit (INDEPENDENT_AMBULATORY_CARE_PROVIDER_SITE_OTHER): Payer: Medicare Other

## 2015-05-24 DIAGNOSIS — J309 Allergic rhinitis, unspecified: Secondary | ICD-10-CM

## 2015-05-31 ENCOUNTER — Ambulatory Visit (INDEPENDENT_AMBULATORY_CARE_PROVIDER_SITE_OTHER): Payer: Medicare Other

## 2015-05-31 DIAGNOSIS — J309 Allergic rhinitis, unspecified: Secondary | ICD-10-CM

## 2015-06-02 NOTE — Telephone Encounter (Signed)
Date Mixed: 05/04/15 Vial: 1 Strength: 1:10 Here/Mail/Pick Up: here Mixed By: tbs

## 2015-06-07 ENCOUNTER — Ambulatory Visit (INDEPENDENT_AMBULATORY_CARE_PROVIDER_SITE_OTHER): Payer: Medicare Other

## 2015-06-07 DIAGNOSIS — J309 Allergic rhinitis, unspecified: Secondary | ICD-10-CM

## 2015-06-21 ENCOUNTER — Ambulatory Visit (INDEPENDENT_AMBULATORY_CARE_PROVIDER_SITE_OTHER): Payer: Medicare Other

## 2015-06-21 DIAGNOSIS — J309 Allergic rhinitis, unspecified: Secondary | ICD-10-CM

## 2015-06-28 ENCOUNTER — Ambulatory Visit (INDEPENDENT_AMBULATORY_CARE_PROVIDER_SITE_OTHER): Payer: Medicare Other

## 2015-06-28 DIAGNOSIS — J309 Allergic rhinitis, unspecified: Secondary | ICD-10-CM

## 2015-06-29 DIAGNOSIS — J0100 Acute maxillary sinusitis, unspecified: Secondary | ICD-10-CM | POA: Diagnosis not present

## 2015-07-05 ENCOUNTER — Ambulatory Visit (INDEPENDENT_AMBULATORY_CARE_PROVIDER_SITE_OTHER): Payer: Medicare Other

## 2015-07-05 DIAGNOSIS — J309 Allergic rhinitis, unspecified: Secondary | ICD-10-CM | POA: Diagnosis not present

## 2015-07-12 ENCOUNTER — Ambulatory Visit (INDEPENDENT_AMBULATORY_CARE_PROVIDER_SITE_OTHER): Payer: Medicare Other

## 2015-07-12 DIAGNOSIS — J309 Allergic rhinitis, unspecified: Secondary | ICD-10-CM | POA: Diagnosis not present

## 2015-07-19 ENCOUNTER — Ambulatory Visit (INDEPENDENT_AMBULATORY_CARE_PROVIDER_SITE_OTHER): Payer: Medicare Other

## 2015-07-19 DIAGNOSIS — J309 Allergic rhinitis, unspecified: Secondary | ICD-10-CM | POA: Diagnosis not present

## 2015-07-26 ENCOUNTER — Ambulatory Visit (INDEPENDENT_AMBULATORY_CARE_PROVIDER_SITE_OTHER): Payer: Medicare Other

## 2015-07-26 DIAGNOSIS — J309 Allergic rhinitis, unspecified: Secondary | ICD-10-CM | POA: Diagnosis not present

## 2015-08-02 ENCOUNTER — Ambulatory Visit (INDEPENDENT_AMBULATORY_CARE_PROVIDER_SITE_OTHER): Payer: Medicare Other

## 2015-08-02 ENCOUNTER — Encounter: Payer: Self-pay | Admitting: Internal Medicine

## 2015-08-02 DIAGNOSIS — J309 Allergic rhinitis, unspecified: Secondary | ICD-10-CM | POA: Diagnosis not present

## 2015-08-09 ENCOUNTER — Ambulatory Visit (INDEPENDENT_AMBULATORY_CARE_PROVIDER_SITE_OTHER): Payer: Medicare Other

## 2015-08-09 DIAGNOSIS — J309 Allergic rhinitis, unspecified: Secondary | ICD-10-CM | POA: Diagnosis not present

## 2015-08-16 ENCOUNTER — Ambulatory Visit (INDEPENDENT_AMBULATORY_CARE_PROVIDER_SITE_OTHER): Payer: Medicare Other

## 2015-08-16 DIAGNOSIS — J309 Allergic rhinitis, unspecified: Secondary | ICD-10-CM

## 2015-08-30 ENCOUNTER — Ambulatory Visit (INDEPENDENT_AMBULATORY_CARE_PROVIDER_SITE_OTHER): Payer: Medicare Other

## 2015-08-30 DIAGNOSIS — J309 Allergic rhinitis, unspecified: Secondary | ICD-10-CM

## 2015-09-12 DIAGNOSIS — J0100 Acute maxillary sinusitis, unspecified: Secondary | ICD-10-CM | POA: Diagnosis not present

## 2015-09-13 ENCOUNTER — Ambulatory Visit (INDEPENDENT_AMBULATORY_CARE_PROVIDER_SITE_OTHER): Payer: Medicare Other

## 2015-09-13 DIAGNOSIS — J309 Allergic rhinitis, unspecified: Secondary | ICD-10-CM | POA: Diagnosis not present

## 2015-09-14 ENCOUNTER — Ambulatory Visit: Payer: Medicare Other | Admitting: Internal Medicine

## 2015-09-19 ENCOUNTER — Ambulatory Visit (INDEPENDENT_AMBULATORY_CARE_PROVIDER_SITE_OTHER): Payer: Medicare Other

## 2015-09-19 DIAGNOSIS — J309 Allergic rhinitis, unspecified: Secondary | ICD-10-CM

## 2015-09-20 ENCOUNTER — Telehealth: Payer: Self-pay | Admitting: Internal Medicine

## 2015-09-20 NOTE — Telephone Encounter (Signed)
Allergy Serum Extract Date Mixed: 09/20/15 Vial: A Strength: 1:10 Here/Mail/Pick Up: Here Mixed By: Lindsay Lemons, CMA 

## 2015-09-21 ENCOUNTER — Ambulatory Visit (INDEPENDENT_AMBULATORY_CARE_PROVIDER_SITE_OTHER): Payer: Medicare Other

## 2015-09-21 DIAGNOSIS — J309 Allergic rhinitis, unspecified: Secondary | ICD-10-CM | POA: Diagnosis not present

## 2015-09-25 ENCOUNTER — Encounter: Payer: Self-pay | Admitting: Internal Medicine

## 2015-09-26 ENCOUNTER — Ambulatory Visit (INDEPENDENT_AMBULATORY_CARE_PROVIDER_SITE_OTHER): Payer: Medicare Other

## 2015-09-26 DIAGNOSIS — J309 Allergic rhinitis, unspecified: Secondary | ICD-10-CM

## 2015-10-02 ENCOUNTER — Ambulatory Visit (INDEPENDENT_AMBULATORY_CARE_PROVIDER_SITE_OTHER): Payer: Medicare Other | Admitting: Internal Medicine

## 2015-10-02 ENCOUNTER — Encounter: Payer: Self-pay | Admitting: Internal Medicine

## 2015-10-02 ENCOUNTER — Ambulatory Visit (INDEPENDENT_AMBULATORY_CARE_PROVIDER_SITE_OTHER): Payer: Medicare Other

## 2015-10-02 VITALS — BP 130/82 | HR 85 | Temp 98.8°F | Resp 20 | Ht 61.0 in | Wt 107.0 lb

## 2015-10-02 DIAGNOSIS — Z8669 Personal history of other diseases of the nervous system and sense organs: Secondary | ICD-10-CM | POA: Diagnosis not present

## 2015-10-02 DIAGNOSIS — J309 Allergic rhinitis, unspecified: Secondary | ICD-10-CM

## 2015-10-02 DIAGNOSIS — Z0000 Encounter for general adult medical examination without abnormal findings: Secondary | ICD-10-CM

## 2015-10-02 DIAGNOSIS — K589 Irritable bowel syndrome without diarrhea: Secondary | ICD-10-CM

## 2015-10-02 DIAGNOSIS — E559 Vitamin D deficiency, unspecified: Secondary | ICD-10-CM | POA: Diagnosis not present

## 2015-10-02 DIAGNOSIS — H9193 Unspecified hearing loss, bilateral: Secondary | ICD-10-CM

## 2015-10-02 DIAGNOSIS — M8580 Other specified disorders of bone density and structure, unspecified site: Secondary | ICD-10-CM

## 2015-10-02 DIAGNOSIS — I10 Essential (primary) hypertension: Secondary | ICD-10-CM | POA: Diagnosis not present

## 2015-10-02 DIAGNOSIS — E119 Type 2 diabetes mellitus without complications: Secondary | ICD-10-CM

## 2015-10-02 DIAGNOSIS — Z23 Encounter for immunization: Secondary | ICD-10-CM | POA: Diagnosis not present

## 2015-10-02 DIAGNOSIS — E785 Hyperlipidemia, unspecified: Secondary | ICD-10-CM | POA: Diagnosis not present

## 2015-10-02 LAB — CBC WITH DIFFERENTIAL/PLATELET
BASOS PCT: 0 % (ref 0–1)
Basophils Absolute: 0 10*3/uL (ref 0.0–0.1)
Eosinophils Absolute: 0.2 10*3/uL (ref 0.0–0.7)
Eosinophils Relative: 3 % (ref 0–5)
HEMATOCRIT: 36.8 % (ref 36.0–46.0)
HEMOGLOBIN: 12.7 g/dL (ref 12.0–15.0)
LYMPHS PCT: 25 % (ref 12–46)
Lymphs Abs: 1.5 10*3/uL (ref 0.7–4.0)
MCH: 31.5 pg (ref 26.0–34.0)
MCHC: 34.5 g/dL (ref 30.0–36.0)
MCV: 91.3 fL (ref 78.0–100.0)
MPV: 8.3 fL — ABNORMAL LOW (ref 8.6–12.4)
Monocytes Absolute: 0.7 10*3/uL (ref 0.1–1.0)
Monocytes Relative: 11 % (ref 3–12)
NEUTROS ABS: 3.7 10*3/uL (ref 1.7–7.7)
NEUTROS PCT: 61 % (ref 43–77)
Platelets: 258 10*3/uL (ref 150–400)
RBC: 4.03 MIL/uL (ref 3.87–5.11)
RDW: 13.9 % (ref 11.5–15.5)
WBC: 6 10*3/uL (ref 4.0–10.5)

## 2015-10-02 LAB — COMPLETE METABOLIC PANEL WITH GFR
ALBUMIN: 4.1 g/dL (ref 3.6–5.1)
ALK PHOS: 50 U/L (ref 33–130)
ALT: 18 U/L (ref 6–29)
AST: 24 U/L (ref 10–35)
BILIRUBIN TOTAL: 0.6 mg/dL (ref 0.2–1.2)
BUN: 10 mg/dL (ref 7–25)
CALCIUM: 9.2 mg/dL (ref 8.6–10.4)
CO2: 28 mmol/L (ref 20–31)
CREATININE: 0.5 mg/dL — AB (ref 0.60–0.88)
Chloride: 97 mmol/L — ABNORMAL LOW (ref 98–110)
GFR, Est African American: >89 mL/min (ref >=60)
GFR, Est Non African American: 89 mL/min (ref >=60)
GLUCOSE: 107 mg/dL — AB (ref 65–99)
Potassium: 4 mmol/L (ref 3.5–5.3)
SODIUM: 133 mmol/L — AB (ref 135–146)
TOTAL PROTEIN: 6.8 g/dL (ref 6.1–8.1)

## 2015-10-02 LAB — LIPID PANEL
Cholesterol: 236 mg/dL — ABNORMAL HIGH (ref 125–200)
HDL: 96 mg/dL (ref >=46)
LDL CALC: 128 mg/dL (ref <130)
TRIGLYCERIDES: 58 mg/dL (ref <150)
Total CHOL/HDL Ratio: 2.5 Ratio (ref <=5.0)
VLDL: 12 mg/dL (ref <30)

## 2015-10-02 LAB — TSH: TSH: 1.175 u[IU]/mL (ref 0.350–4.500)

## 2015-10-02 MED ORDER — SUMATRIPTAN SUCCINATE 100 MG PO TABS: ORAL_TABLET | ORAL | Status: DC

## 2015-10-02 MED ORDER — LEVOFLOXACIN 500 MG PO TABS: 500.0000 mg | ORAL_TABLET | Freq: Every day | ORAL | Status: DC

## 2015-10-03 LAB — VITAMIN D 25 HYDROXY (VIT D DEFICIENCY, FRACTURES): VIT D 25 HYDROXY: 18 ng/mL — AB (ref 30–100)

## 2015-10-03 LAB — HEMOGLOBIN A1C
HEMOGLOBIN A1C: 6.2 % — AB (ref <5.7)
Mean Plasma Glucose: 131 mg/dL — ABNORMAL HIGH (ref <117)

## 2015-10-03 LAB — MICROALBUMIN, URINE: MICROALB UR: 0.3 mg/dL

## 2015-10-11 ENCOUNTER — Ambulatory Visit (INDEPENDENT_AMBULATORY_CARE_PROVIDER_SITE_OTHER): Payer: Medicare Other

## 2015-10-11 DIAGNOSIS — J309 Allergic rhinitis, unspecified: Secondary | ICD-10-CM

## 2015-10-16 ENCOUNTER — Other Ambulatory Visit: Payer: Self-pay

## 2015-10-16 MED ORDER — AMLODIPINE BESYLATE 5 MG PO TABS: 5.0000 mg | ORAL_TABLET | Freq: Every day | ORAL | Status: DC

## 2015-10-18 ENCOUNTER — Ambulatory Visit (INDEPENDENT_AMBULATORY_CARE_PROVIDER_SITE_OTHER): Payer: Medicare Other

## 2015-10-18 DIAGNOSIS — J309 Allergic rhinitis, unspecified: Secondary | ICD-10-CM

## 2015-10-24 ENCOUNTER — Other Ambulatory Visit: Payer: Self-pay

## 2015-10-24 ENCOUNTER — Ambulatory Visit (INDEPENDENT_AMBULATORY_CARE_PROVIDER_SITE_OTHER): Payer: Medicare Other

## 2015-10-24 DIAGNOSIS — J309 Allergic rhinitis, unspecified: Secondary | ICD-10-CM

## 2015-10-24 MED ORDER — AMLODIPINE BESYLATE 5 MG PO TABS: 5.0000 mg | ORAL_TABLET | Freq: Every day | ORAL | Status: DC

## 2015-10-27 DIAGNOSIS — J0100 Acute maxillary sinusitis, unspecified: Secondary | ICD-10-CM | POA: Diagnosis not present

## 2015-11-01 ENCOUNTER — Ambulatory Visit (INDEPENDENT_AMBULATORY_CARE_PROVIDER_SITE_OTHER): Payer: Medicare Other

## 2015-11-01 DIAGNOSIS — J309 Allergic rhinitis, unspecified: Secondary | ICD-10-CM | POA: Diagnosis not present

## 2015-11-07 ENCOUNTER — Ambulatory Visit (INDEPENDENT_AMBULATORY_CARE_PROVIDER_SITE_OTHER): Payer: Medicare Other

## 2015-11-07 DIAGNOSIS — J309 Allergic rhinitis, unspecified: Secondary | ICD-10-CM

## 2015-11-07 DIAGNOSIS — Z803 Family history of malignant neoplasm of breast: Secondary | ICD-10-CM | POA: Diagnosis not present

## 2015-11-07 DIAGNOSIS — Z1231 Encounter for screening mammogram for malignant neoplasm of breast: Secondary | ICD-10-CM | POA: Diagnosis not present

## 2015-11-15 ENCOUNTER — Ambulatory Visit (INDEPENDENT_AMBULATORY_CARE_PROVIDER_SITE_OTHER): Payer: Medicare Other

## 2015-11-15 DIAGNOSIS — J309 Allergic rhinitis, unspecified: Secondary | ICD-10-CM

## 2015-11-22 ENCOUNTER — Ambulatory Visit (INDEPENDENT_AMBULATORY_CARE_PROVIDER_SITE_OTHER): Payer: Medicare Other

## 2015-11-22 DIAGNOSIS — J309 Allergic rhinitis, unspecified: Secondary | ICD-10-CM | POA: Diagnosis not present

## 2015-11-30 ENCOUNTER — Ambulatory Visit (INDEPENDENT_AMBULATORY_CARE_PROVIDER_SITE_OTHER): Payer: Medicare Other

## 2015-11-30 DIAGNOSIS — J309 Allergic rhinitis, unspecified: Secondary | ICD-10-CM | POA: Diagnosis not present

## 2015-12-06 ENCOUNTER — Ambulatory Visit (INDEPENDENT_AMBULATORY_CARE_PROVIDER_SITE_OTHER): Payer: Medicare Other

## 2015-12-06 DIAGNOSIS — J0100 Acute maxillary sinusitis, unspecified: Secondary | ICD-10-CM | POA: Diagnosis not present

## 2015-12-06 DIAGNOSIS — J309 Allergic rhinitis, unspecified: Secondary | ICD-10-CM | POA: Diagnosis not present

## 2015-12-19 NOTE — Patient Instructions (Signed)
Continue diet exercise efforts. Return in 6 months. Flu vaccine given today. It was a pleasure to see you.

## 2015-12-19 NOTE — Progress Notes (Signed)
Subjective:    Patient ID: Anna Ewing, female    DOB: December 20, 1928, 82 y.o.   MRN: [id number]  HPI 82 year old White Female in today for health maintenance exam and evaluation of medical issues. History of hypertension, hyperlipidemia, allergic rhinitis, migraine headaches, controlled type 2 diabetes mellitus, hearing loss, irritable bowel syndrome, osteoporosis.  Migraine headaches are infrequent. History of bilateral hearing loss and wears bilateral hearing aids. Has taken allergy immunizations in the past. Diabetes is diet control.  Past medical history: Fractured right wrist 1994, lymphangitis of the left upper extremity 1997, total abdominal hysterectomy with BSO, cold packs he for uterine prolapse, cystocele and rectocele repair at [facility] in 2002. Sinus surgery in May 2012.  History of positive ANA diagnosed in 2002 with negative anti-DNA. Was seen by rheumatologist to did not think she had connective tissue disease.  History of urticaria dating back to high school when she is under stress. Had recurrence in 2013.  Was sent to [facility] regarding intermittent right hearing loss. She saw Dr. Lenoria Farrier and subsequently was referred to neurologist. She had an MRI of the brain which showed dural enhancement. It was not clear what was causing that condition. Had negative spinal tap. SPEP was negative. Chest x-ray raised the possibility of sarcoidosis. She had a limited CT of the chest that did not show active sarcoidosis.  History of cataract extraction right eye in November 2014 by Dr. Vonna Kotyk. Dr. Precious Bard is her optometrist and [place] and she sees him regularly for eye exam.  Social history: She is a retired Haematologist. Husband is retired from [employer]. She has school education. She has a son and a daughter. She does not smoke or consume alcohol. She is a former smoker however.  Family history: Father died at age 33 of heart failure. Mother died at age 39 with history of breast  cancer that metastasized to the bone as well as congestive heart failure.  Patient had colonoscopy in 2009 by Dr. Matthias Hughs showing diverticulosis. He has indicated she should not have any further colonoscopies at her age.  Tetanus immunization up-to-date. Pneumovax immunization 2004. Prevnar 10/19/2014.      Review of Systems  Constitutional: Negative.   All other systems reviewed and are negative.      Objective:   Physical Exam  Constitutional: She is oriented to person, place, and time. She appears well-developed and well-nourished. No distress.  HENT:  Head: Normocephalic and atraumatic.  Right Ear: External ear normal.  Mouth/Throat: Oropharynx is clear and moist. No oropharyngeal exudate.  Eyes: Conjunctivae and EOM are normal. Pupils are equal, round, and reactive to light. Right eye exhibits no discharge. Left eye exhibits no discharge. No scleral icterus.  Neck: Neck supple. No JVD present. No thyromegaly present.  Cardiovascular: Normal rate, regular rhythm and normal heart sounds.   No murmur heard. Pulmonary/Chest: Effort normal and breath sounds normal. No respiratory distress. She has no wheezes. She has no rales. She exhibits no tenderness.  Breast normal female  Abdominal: Soft. Bowel sounds are normal. She exhibits no distension and no mass. There is no tenderness. There is no rebound and no guarding.  Genitourinary:  Deferred due to hysterectomy BSO and age  Musculoskeletal: She exhibits no edema.  Lymphadenopathy:    She has no cervical adenopathy.  Neurological: She is alert and oriented to person, place, and time. She has normal reflexes. She displays normal reflexes. No cranial nerve deficit. Coordination normal.  Skin: Skin is warm and dry. No  rash noted. She is not diaphoretic.  Psychiatric: She has a normal mood and affect. Her behavior is normal. Judgment and thought content normal.  Vitals reviewed.         Assessment & Plan:   Normal health  maintenance exam  Hypertension-stable  History of migraine headaches-infrequent and controlled with triptan medication  Allergic rhinitis-treated by Dr. Maple Hudson  History of irritable bowel syndrome  Hyperlipidemia treated with statin medication-total cholesterol is 236 with an LDL cholesterol of 128 and an 8 still cholesterol of 96. Continue to watch diet. Try to exercise.  Type 2 diabetes mellitus which is diet controlled-A1c is 6.2%  Vitamin D deficiency-level is 18. Needs vitamin D supplementation  Plan: Return in 6 months or as needed. Flu vaccine given today.    Subjective:   Patient presents for Medicare Annual/Subsequent preventive examination.  Review Past Medical/Family/Social: See above   Risk Factors  Current exercise habits: Sedentary Dietary issues discussed: Low fat low carbohydrate  Cardiac risk factors: Hyperlipidemia, diabetes, family history of heart failure in father  Depression Screen  (Note: if answer to either of the following is "Yes", a more complete depression screening is indicated)   Over the past two weeks, have you felt down, depressed or hopeless? No  Over the past two weeks, have you felt little interest or pleasure in doing things? No Have you lost interest or pleasure in daily life? No Do you often feel hopeless? No Do you cry easily over simple problems? No   Activities of Daily Living  In your present state of health, do you have any difficulty performing the following activities?:   Driving? No  Managing money? No  Feeding yourself? No  Getting from bed to chair? No  Climbing a flight of stairs? No  Preparing food and eating?: No  Bathing or showering? No  Getting dressed: No  Getting to the toilet? No  Using the toilet:No  Moving around from place to place: No  In the past year have you fallen or had a near fall?:No  Are you sexually active? No  Do you have more than one partner? No   Hearing Difficulties: Yes-wears hearing  aids Do you often ask people to speak up or repeat themselves? Yes Do you experience ringing or noises in your ears? No  Do you have difficulty understanding soft or whispered voices? Yes Do you feel that you have a problem with memory? No Do you often misplace items? No    Home Safety:  Do you have a smoke alarm at your residence? Yes Do you have grab bars in the bathroom? No Do you have throw rugs in your house? No   Cognitive Testing  Alert? Yes Normal Appearance?Yes  Oriented to person? Yes Place? Yes  Time? Yes  Recall of three objects? Yes  Can perform simple calculations? Yes  Displays appropriate judgment?Yes  Can read the correct time from a watch face?Yes   List the Names of Other Physician/Practitioners you currently use:  See referral list for the physicians patient is currently seeing.  Eye physician and [place]   Review of Systems: See above   Objective:     General appearance: Appears stated age and underweight Head: Normocephalic, without obvious abnormality, atraumatic  Eyes: conj clear, EOMi PEERLA  Ears: normal TM's and external ear canals both ears  Nose: Nares normal. Septum midline. Mucosa normal. No drainage or sinus tenderness.  Throat: lips, mucosa, and tongue normal; teeth and gums normal  Neck: no  adenopathy, no carotid bruit, no JVD, supple, symmetrical, trachea midline and thyroid not enlarged, symmetric, no tenderness/mass/nodules  No CVA tenderness.  Lungs: clear to auscultation bilaterally  Breasts: normal appearance, no masses or tenderness Heart: regular rate and rhythm, S1, S2 normal, no murmur, click, rub or gallop  Abdomen: soft, non-tender; bowel sounds normal; no masses, no organomegaly  Musculoskeletal: ROM normal in all joints, no crepitus, no deformity, Normal muscle strengthen. Back  is symmetric, no curvature. Skin: Skin color, texture, turgor normal. No rashes or lesions  Lymph nodes: Cervical, supraclavicular, and  axillary nodes normal.  Neurologic: CN 2 -12 Normal, Normal symmetric reflexes. Normal coordination and gait  Psych: Alert & Oriented x 3, Mood appear stable.    Assessment:    Annual wellness medicare exam   Plan:    During the course of the visit the patient was educated and counseled about appropriate screening and preventive services including:   Annual flu vaccine     Patient Instructions (the written plan) was given to the patient.  Medicare Attestation  I have personally reviewed:  The patient's medical and social history  Their use of alcohol, tobacco or illicit drugs  Their current medications and supplements  The patient's functional ability including ADLs,fall risks, home safety risks, cognitive, and hearing and visual impairment  Diet and physical activities  Evidence for depression or mood disorders  The patient's weight, height, BMI, and visual acuity have been recorded in the chart. I have made referrals, counseling, and provided education to the patient based on review of the above and I have provided the patient with a written personalized care plan for preventive services.

## 2015-12-20 ENCOUNTER — Ambulatory Visit (INDEPENDENT_AMBULATORY_CARE_PROVIDER_SITE_OTHER): Payer: Medicare Other

## 2015-12-20 DIAGNOSIS — J309 Allergic rhinitis, unspecified: Secondary | ICD-10-CM | POA: Diagnosis not present

## 2015-12-25 ENCOUNTER — Ambulatory Visit (INDEPENDENT_AMBULATORY_CARE_PROVIDER_SITE_OTHER): Payer: Medicare Other

## 2015-12-25 DIAGNOSIS — J309 Allergic rhinitis, unspecified: Secondary | ICD-10-CM | POA: Diagnosis not present

## 2016-01-03 ENCOUNTER — Ambulatory Visit (INDEPENDENT_AMBULATORY_CARE_PROVIDER_SITE_OTHER): Payer: Medicare Other

## 2016-01-03 ENCOUNTER — Ambulatory Visit: Payer: Self-pay

## 2016-01-03 DIAGNOSIS — J309 Allergic rhinitis, unspecified: Secondary | ICD-10-CM

## 2016-01-11 ENCOUNTER — Ambulatory Visit (INDEPENDENT_AMBULATORY_CARE_PROVIDER_SITE_OTHER): Payer: Medicare Other

## 2016-01-11 DIAGNOSIS — J309 Allergic rhinitis, unspecified: Secondary | ICD-10-CM

## 2016-01-23 ENCOUNTER — Ambulatory Visit (INDEPENDENT_AMBULATORY_CARE_PROVIDER_SITE_OTHER): Payer: Medicare Other | Admitting: *Deleted

## 2016-01-23 DIAGNOSIS — J309 Allergic rhinitis, unspecified: Secondary | ICD-10-CM | POA: Diagnosis not present

## 2016-01-25 DIAGNOSIS — J0100 Acute maxillary sinusitis, unspecified: Secondary | ICD-10-CM | POA: Diagnosis not present

## 2016-01-25 DIAGNOSIS — H66001 Acute suppurative otitis media without spontaneous rupture of ear drum, right ear: Secondary | ICD-10-CM | POA: Diagnosis not present

## 2016-02-01 ENCOUNTER — Ambulatory Visit (INDEPENDENT_AMBULATORY_CARE_PROVIDER_SITE_OTHER): Payer: Medicare Other | Admitting: *Deleted

## 2016-02-01 DIAGNOSIS — J309 Allergic rhinitis, unspecified: Secondary | ICD-10-CM | POA: Diagnosis not present

## 2016-02-07 ENCOUNTER — Telehealth: Payer: Self-pay | Admitting: *Deleted

## 2016-02-07 DIAGNOSIS — J309 Allergic rhinitis, unspecified: Secondary | ICD-10-CM | POA: Diagnosis not present

## 2016-02-07 NOTE — Telephone Encounter (Signed)
Allergy Serum Extract Date Mixed: 02/07/16 Vial: 1 Strength: 1:10 Here/Mail/Pick Up: here Mixed By: tbs Last OV: 03/15/15 Pending OV: 09/14/15

## 2016-02-15 ENCOUNTER — Ambulatory Visit (INDEPENDENT_AMBULATORY_CARE_PROVIDER_SITE_OTHER): Payer: Medicare Other | Admitting: *Deleted

## 2016-02-15 DIAGNOSIS — J309 Allergic rhinitis, unspecified: Secondary | ICD-10-CM

## 2016-02-29 ENCOUNTER — Ambulatory Visit (INDEPENDENT_AMBULATORY_CARE_PROVIDER_SITE_OTHER): Payer: Medicare Other | Admitting: *Deleted

## 2016-02-29 DIAGNOSIS — J309 Allergic rhinitis, unspecified: Secondary | ICD-10-CM | POA: Diagnosis not present

## 2016-03-13 ENCOUNTER — Ambulatory Visit (INDEPENDENT_AMBULATORY_CARE_PROVIDER_SITE_OTHER): Payer: Medicare Other | Admitting: *Deleted

## 2016-03-13 DIAGNOSIS — J309 Allergic rhinitis, unspecified: Secondary | ICD-10-CM | POA: Diagnosis not present

## 2016-04-04 ENCOUNTER — Ambulatory Visit (INDEPENDENT_AMBULATORY_CARE_PROVIDER_SITE_OTHER): Payer: Medicare Other | Admitting: *Deleted

## 2016-04-04 ENCOUNTER — Encounter: Payer: Self-pay | Admitting: Internal Medicine

## 2016-04-04 DIAGNOSIS — J309 Allergic rhinitis, unspecified: Secondary | ICD-10-CM

## 2016-04-18 ENCOUNTER — Telehealth: Payer: Self-pay | Admitting: Internal Medicine

## 2016-04-18 MED ORDER — AMLODIPINE BESYLATE 5 MG PO TABS: 5.0000 mg | ORAL_TABLET | Freq: Every day | ORAL | Status: DC

## 2016-04-18 NOTE — Telephone Encounter (Signed)
Patient calling to request refill on her Amlodipine 5mg .    Pharmacy:  [facility] (her mail order).  Thank you.

## 2016-04-18 NOTE — Telephone Encounter (Signed)
Please refill 0ne year

## 2016-04-23 DIAGNOSIS — J0100 Acute maxillary sinusitis, unspecified: Secondary | ICD-10-CM | POA: Diagnosis not present

## 2016-04-24 ENCOUNTER — Ambulatory Visit: Payer: Self-pay

## 2016-04-27 DIAGNOSIS — H9319 Tinnitus, unspecified ear: Secondary | ICD-10-CM | POA: Diagnosis not present

## 2016-04-27 DIAGNOSIS — J069 Acute upper respiratory infection, unspecified: Secondary | ICD-10-CM | POA: Diagnosis not present

## 2016-05-01 ENCOUNTER — Ambulatory Visit (INDEPENDENT_AMBULATORY_CARE_PROVIDER_SITE_OTHER): Payer: Medicare Other | Admitting: *Deleted

## 2016-05-01 DIAGNOSIS — J309 Allergic rhinitis, unspecified: Secondary | ICD-10-CM | POA: Diagnosis not present

## 2016-05-07 DIAGNOSIS — H9193 Unspecified hearing loss, bilateral: Secondary | ICD-10-CM | POA: Diagnosis not present

## 2016-05-07 DIAGNOSIS — I10 Essential (primary) hypertension: Secondary | ICD-10-CM | POA: Diagnosis not present

## 2016-05-07 DIAGNOSIS — H903 Sensorineural hearing loss, bilateral: Secondary | ICD-10-CM | POA: Diagnosis not present

## 2016-05-07 DIAGNOSIS — H905 Unspecified sensorineural hearing loss: Secondary | ICD-10-CM | POA: Diagnosis not present

## 2016-05-18 ENCOUNTER — Other Ambulatory Visit: Payer: Self-pay | Admitting: Internal Medicine

## 2016-05-22 ENCOUNTER — Ambulatory Visit (INDEPENDENT_AMBULATORY_CARE_PROVIDER_SITE_OTHER): Payer: Self-pay | Admitting: *Deleted

## 2016-05-22 DIAGNOSIS — J309 Allergic rhinitis, unspecified: Secondary | ICD-10-CM

## 2016-05-30 ENCOUNTER — Ambulatory Visit (INDEPENDENT_AMBULATORY_CARE_PROVIDER_SITE_OTHER): Payer: Self-pay | Admitting: *Deleted

## 2016-05-30 DIAGNOSIS — J309 Allergic rhinitis, unspecified: Secondary | ICD-10-CM

## 2016-06-05 ENCOUNTER — Ambulatory Visit: Payer: Self-pay

## 2016-06-05 DIAGNOSIS — J069 Acute upper respiratory infection, unspecified: Secondary | ICD-10-CM | POA: Diagnosis not present

## 2016-06-05 DIAGNOSIS — H81399 Other peripheral vertigo, unspecified ear: Secondary | ICD-10-CM | POA: Diagnosis not present

## 2016-06-07 ENCOUNTER — Ambulatory Visit: Payer: Self-pay

## 2016-06-20 ENCOUNTER — Ambulatory Visit (INDEPENDENT_AMBULATORY_CARE_PROVIDER_SITE_OTHER): Payer: Self-pay

## 2016-06-20 DIAGNOSIS — J309 Allergic rhinitis, unspecified: Secondary | ICD-10-CM

## 2016-06-26 ENCOUNTER — Ambulatory Visit (INDEPENDENT_AMBULATORY_CARE_PROVIDER_SITE_OTHER): Payer: Self-pay | Admitting: *Deleted

## 2016-06-26 DIAGNOSIS — J309 Allergic rhinitis, unspecified: Secondary | ICD-10-CM

## 2016-07-16 ENCOUNTER — Ambulatory Visit: Payer: Self-pay

## 2016-07-25 ENCOUNTER — Ambulatory Visit (INDEPENDENT_AMBULATORY_CARE_PROVIDER_SITE_OTHER): Payer: Medicare Other | Admitting: *Deleted

## 2016-07-25 DIAGNOSIS — J309 Allergic rhinitis, unspecified: Secondary | ICD-10-CM

## 2016-08-07 ENCOUNTER — Ambulatory Visit (INDEPENDENT_AMBULATORY_CARE_PROVIDER_SITE_OTHER): Payer: Medicare Other | Admitting: *Deleted

## 2016-08-07 DIAGNOSIS — J309 Allergic rhinitis, unspecified: Secondary | ICD-10-CM

## 2016-08-15 ENCOUNTER — Ambulatory Visit (INDEPENDENT_AMBULATORY_CARE_PROVIDER_SITE_OTHER): Payer: Medicare Other | Admitting: *Deleted

## 2016-08-15 DIAGNOSIS — J309 Allergic rhinitis, unspecified: Secondary | ICD-10-CM

## 2016-08-29 ENCOUNTER — Ambulatory Visit (INDEPENDENT_AMBULATORY_CARE_PROVIDER_SITE_OTHER): Payer: Medicare Other | Admitting: *Deleted

## 2016-08-29 DIAGNOSIS — J309 Allergic rhinitis, unspecified: Secondary | ICD-10-CM | POA: Diagnosis not present

## 2016-09-18 DIAGNOSIS — H903 Sensorineural hearing loss, bilateral: Secondary | ICD-10-CM | POA: Diagnosis not present

## 2016-09-18 DIAGNOSIS — H918X1 Other specified hearing loss, right ear: Secondary | ICD-10-CM | POA: Diagnosis not present

## 2016-09-19 ENCOUNTER — Ambulatory Visit (INDEPENDENT_AMBULATORY_CARE_PROVIDER_SITE_OTHER): Payer: Medicare Other | Admitting: *Deleted

## 2016-09-19 DIAGNOSIS — J309 Allergic rhinitis, unspecified: Secondary | ICD-10-CM

## 2016-09-19 DIAGNOSIS — J0100 Acute maxillary sinusitis, unspecified: Secondary | ICD-10-CM | POA: Diagnosis not present

## 2016-10-01 ENCOUNTER — Ambulatory Visit (INDEPENDENT_AMBULATORY_CARE_PROVIDER_SITE_OTHER): Payer: Medicare Other | Admitting: *Deleted

## 2016-10-01 DIAGNOSIS — J309 Allergic rhinitis, unspecified: Secondary | ICD-10-CM

## 2016-10-16 ENCOUNTER — Ambulatory Visit (INDEPENDENT_AMBULATORY_CARE_PROVIDER_SITE_OTHER): Payer: Medicare Other | Admitting: *Deleted

## 2016-10-16 DIAGNOSIS — J309 Allergic rhinitis, unspecified: Secondary | ICD-10-CM

## 2016-10-22 DIAGNOSIS — J0100 Acute maxillary sinusitis, unspecified: Secondary | ICD-10-CM | POA: Diagnosis not present

## 2016-10-24 ENCOUNTER — Other Ambulatory Visit: Payer: Self-pay | Admitting: Internal Medicine

## 2016-10-24 MED ORDER — AMLODIPINE BESYLATE 5 MG PO TABS: 5.0000 mg | ORAL_TABLET | Freq: Every day | ORAL | 0 refills | Status: DC

## 2016-10-31 ENCOUNTER — Ambulatory Visit (INDEPENDENT_AMBULATORY_CARE_PROVIDER_SITE_OTHER): Payer: Medicare Other | Admitting: *Deleted

## 2016-10-31 DIAGNOSIS — J309 Allergic rhinitis, unspecified: Secondary | ICD-10-CM | POA: Diagnosis not present

## 2016-11-12 ENCOUNTER — Ambulatory Visit: Payer: Medicare Other

## 2016-11-13 ENCOUNTER — Ambulatory Visit (INDEPENDENT_AMBULATORY_CARE_PROVIDER_SITE_OTHER): Payer: Medicare Other | Admitting: *Deleted

## 2016-11-13 DIAGNOSIS — J309 Allergic rhinitis, unspecified: Secondary | ICD-10-CM

## 2016-11-20 DIAGNOSIS — Z1231 Encounter for screening mammogram for malignant neoplasm of breast: Secondary | ICD-10-CM | POA: Diagnosis not present

## 2016-11-20 DIAGNOSIS — Z803 Family history of malignant neoplasm of breast: Secondary | ICD-10-CM | POA: Diagnosis not present

## 2016-11-27 ENCOUNTER — Encounter: Payer: Self-pay | Admitting: Internal Medicine

## 2016-12-09 DIAGNOSIS — J0100 Acute maxillary sinusitis, unspecified: Secondary | ICD-10-CM | POA: Diagnosis not present

## 2016-12-10 ENCOUNTER — Other Ambulatory Visit: Payer: Medicare Other | Admitting: Internal Medicine

## 2016-12-10 DIAGNOSIS — E118 Type 2 diabetes mellitus with unspecified complications: Secondary | ICD-10-CM | POA: Diagnosis not present

## 2016-12-10 DIAGNOSIS — Z1321 Encounter for screening for nutritional disorder: Secondary | ICD-10-CM | POA: Diagnosis not present

## 2016-12-10 DIAGNOSIS — Z1329 Encounter for screening for other suspected endocrine disorder: Secondary | ICD-10-CM

## 2016-12-10 DIAGNOSIS — E785 Hyperlipidemia, unspecified: Secondary | ICD-10-CM

## 2016-12-10 DIAGNOSIS — M8580 Other specified disorders of bone density and structure, unspecified site: Secondary | ICD-10-CM | POA: Diagnosis not present

## 2016-12-10 LAB — CBC WITH DIFFERENTIAL/PLATELET
BASOS ABS: 0 {cells}/uL (ref 0–200)
Basophils Relative: 0 %
EOS PCT: 5 %
Eosinophils Absolute: 405 cells/uL (ref 15–500)
HCT: 35.7 % (ref 35.0–45.0)
Hemoglobin: 11.8 g/dL (ref 11.7–15.5)
LYMPHS PCT: 20 %
Lymphs Abs: 1620 cells/uL (ref 850–3900)
MCH: 30.6 pg (ref 27.0–33.0)
MCHC: 33.1 g/dL (ref 32.0–36.0)
MCV: 92.5 fL (ref 80.0–100.0)
MONOS PCT: 8 %
MPV: 8.4 fL (ref 7.5–12.5)
Monocytes Absolute: 648 cells/uL (ref 200–950)
NEUTROS PCT: 67 %
Neutro Abs: 5427 cells/uL (ref 1500–7800)
PLATELETS: 268 10*3/uL (ref 140–400)
RBC: 3.86 MIL/uL (ref 3.80–5.10)
RDW: 14.2 % (ref 11.0–15.0)
WBC: 8.1 10*3/uL (ref 3.8–10.8)

## 2016-12-10 LAB — COMPREHENSIVE METABOLIC PANEL
ALBUMIN: 3.7 g/dL (ref 3.6–5.1)
ALT: 27 U/L (ref 6–29)
AST: 32 U/L (ref 10–35)
Alkaline Phosphatase: 43 U/L (ref 33–130)
BUN: 11 mg/dL (ref 7–25)
CHLORIDE: 99 mmol/L (ref 98–110)
CO2: 20 mmol/L (ref 20–31)
CREATININE: 0.47 mg/dL — AB (ref 0.60–0.88)
Calcium: 9.1 mg/dL (ref 8.6–10.4)
Glucose, Bld: 101 mg/dL — ABNORMAL HIGH (ref 65–99)
Potassium: 4.3 mmol/L (ref 3.5–5.3)
SODIUM: 132 mmol/L — AB (ref 135–146)
Total Bilirubin: 0.6 mg/dL (ref 0.2–1.2)
Total Protein: 6.6 g/dL (ref 6.1–8.1)

## 2016-12-10 LAB — LIPID PANEL
Cholesterol: 229 mg/dL — ABNORMAL HIGH (ref <200)
HDL: 76 mg/dL (ref >50)
LDL CALC: 133 mg/dL — AB (ref <100)
TRIGLYCERIDES: 99 mg/dL (ref <150)
Total CHOL/HDL Ratio: 3 Ratio (ref <5.0)
VLDL: 20 mg/dL (ref <30)

## 2016-12-10 LAB — TSH: TSH: 2.03 mIU/L

## 2016-12-11 LAB — VITAMIN D 25 HYDROXY (VIT D DEFICIENCY, FRACTURES): Vit D, 25-Hydroxy: 19 ng/mL — ABNORMAL LOW (ref 30–100)

## 2016-12-11 LAB — MICROALBUMIN / CREATININE URINE RATIO

## 2016-12-11 LAB — HEMOGLOBIN A1C
HEMOGLOBIN A1C: 5.9 % — AB (ref <5.7)
Mean Plasma Glucose: 123 mg/dL

## 2016-12-12 ENCOUNTER — Other Ambulatory Visit: Payer: Self-pay | Admitting: Internal Medicine

## 2016-12-12 ENCOUNTER — Encounter: Payer: Self-pay | Admitting: Internal Medicine

## 2017-02-11 ENCOUNTER — Ambulatory Visit (INDEPENDENT_AMBULATORY_CARE_PROVIDER_SITE_OTHER): Payer: Medicare Other | Admitting: Internal Medicine

## 2017-02-11 ENCOUNTER — Encounter: Payer: Self-pay | Admitting: Internal Medicine

## 2017-02-11 VITALS — BP 150/80 | HR 106 | Temp 98.0°F | Ht 59.5 in | Wt 102.0 lb

## 2017-02-11 DIAGNOSIS — K589 Irritable bowel syndrome without diarrhea: Secondary | ICD-10-CM

## 2017-02-11 DIAGNOSIS — M810 Age-related osteoporosis without current pathological fracture: Secondary | ICD-10-CM | POA: Diagnosis not present

## 2017-02-11 DIAGNOSIS — R634 Abnormal weight loss: Secondary | ICD-10-CM

## 2017-02-11 DIAGNOSIS — I10 Essential (primary) hypertension: Secondary | ICD-10-CM

## 2017-02-11 DIAGNOSIS — Z0000 Encounter for general adult medical examination without abnormal findings: Secondary | ICD-10-CM

## 2017-02-11 DIAGNOSIS — E119 Type 2 diabetes mellitus without complications: Secondary | ICD-10-CM | POA: Diagnosis not present

## 2017-02-11 DIAGNOSIS — E7849 Other hyperlipidemia: Secondary | ICD-10-CM

## 2017-02-11 DIAGNOSIS — Z8669 Personal history of other diseases of the nervous system and sense organs: Secondary | ICD-10-CM | POA: Diagnosis not present

## 2017-02-11 DIAGNOSIS — H903 Sensorineural hearing loss, bilateral: Secondary | ICD-10-CM

## 2017-02-11 DIAGNOSIS — E784 Other hyperlipidemia: Secondary | ICD-10-CM

## 2017-02-11 DIAGNOSIS — R7302 Impaired glucose tolerance (oral): Secondary | ICD-10-CM | POA: Diagnosis not present

## 2017-02-11 LAB — POCT URINALYSIS DIPSTICK
Bilirubin, UA: NEGATIVE
GLUCOSE UA: NEGATIVE
KETONES UA: NEGATIVE
Leukocytes, UA: NEGATIVE
Nitrite, UA: NEGATIVE
Protein, UA: NEGATIVE
RBC UA: NEGATIVE
SPEC GRAV UA: 1.01 (ref 1.030–1.035)
UROBILINOGEN UA: 0.2 (ref ?–2.0)
pH, UA: 7 (ref 5.0–8.0)

## 2017-02-11 MED ORDER — ERGOCALCIFEROL 1.25 MG (50000 UT) PO CAPS: 50000.0000 [IU] | ORAL_CAPSULE | ORAL | 0 refills | Status: DC

## 2017-02-11 MED ORDER — CYCLOBENZAPRINE HCL 10 MG PO TABS
ORAL_TABLET | ORAL | 5 refills | Status: DC
Start: 2017-02-11 — End: 2017-08-12

## 2017-02-11 NOTE — Progress Notes (Signed)
Subjective:    Patient ID: Anna ArntSerita L Blanke, female    DOB: 09/13/1929, 83 y.o.   MRN: [id number]  HPI 83 year old Female for Health maintenance exam. Weight is 102 pounds and in November 2016 was 107 lbs. In May 2016 was 113 pounds.  Stopped allergy injections in December when she learned Dr. Maple HudsonYoung was cutting back.Marland Kitchen. Has been referred to Dr. Lucie LeatherKozlow. She may [place] It may be more convenient for her.   Husband has had back surgery several months ago and she has been taking care of him. It has taken a toll on her. He is still not completely well and she drives him everywhere he needs to go. She has not been able to go to the beach and she loves the beach. She has lost weight. Says they were not eating very well when he was recovering from back surgery because he had nausea and was unable to eat for a while and she didn't eat well either.  No falls. Denies depression. Just tired.  She has a history of hypertension, hyperlipidemia, allergic rhinitis, migraine headaches, controlled type 2 diabetes mellitus, bilateral hearing loss, irritable bowel syndrome, osteoporosis.  Migraine headaches are infrequent. Wears bilateral hearing aids.  Diabetes is diet controlled.  Past medical history: Fractured right wrist 1994, lymphangitis of the left upper extremity 1997, total abdominal hysterectomy with BSO for uterine prolapse, [facility]sto[facility] in [facility]. Sinus surgery May 2012.  History of positive ANA diagnosed in 2002 with negative anti-DNA. Was seen by rheumatologist who did not think she had connective tissue disease.  History of urticaria dating back to high school when she is under stress. Had recurrence in 2013.  [facility] regarding intermittent right hearing loss. She saw Dr. pills. Subsequently was referred to neurologist. She had an MRI of the brain which showed dural enhancement. It was not clear what was causing that  condition. She had a negative spinal tap. SPEP was negative. Chest x-ray raised the possibility of sarcoidosis. She had a limited CT of the chest that did not show active sarcoidosis.  History of cataract extraction right eye in November 2014 by Dr. Vonna KotykBevis. Dr. Precious BardSnipes is her optometrist in [place]. She sees him regularly for eye exam.  Social history: She is retired Haematologistbank teller. Husband is retired from [employer]. She has a high school education. She has a son and a daughter. She does not smoke or consume alcohol. She is a former smoker however.  Family history: Father died at age 81 of heart failure. Mother died at age 81 with history of breast cancer that metastasized to the bone as well as congestive heart failure.  Patient had colonoscopy     in 2009 by Dr. Kirstie PeriBucci knee showing diverticulosis. He has indicated she should not have any further colonoscopies at her age.  Pneumovax and Prevnar immunizations up-to-date. Tetanus immunization up-to-date. Review of Systems     Objective:   Physical Exam  Constitutional: She is oriented to person, place, and time. She appears well-developed. No distress.  Then female in no acute distress.  HENT:  Head: Normocephalic and atraumatic.  Right Ear: External ear normal.  Left Ear: External ear normal.  Nose: Nose normal.  Mouth/Throat: Oropharynx is clear and moist. No oropharyngeal exudate.  Eyes: Conjunctivae and EOM are normal. Pupils are equal, round, and reactive to light. Right eye exhibits no discharge. Left eye exhibits no discharge. No scleral  icterus.  Neck: No JVD present. No thyromegaly present.  Cardiovascular: Normal rate, regular rhythm, normal heart sounds and intact distal pulses.   I/VI SEM  Pulmonary/Chest: Effort normal and breath sounds normal. She has no wheezes. She has no rales.  Breasts normal female  Abdominal: Soft. Bowel sounds are normal. She exhibits no distension. There is no tenderness. There is no rebound and no  guarding.  Genitourinary:  Genitourinary Comments: Deferred due to hysterectomy and age  Neurological: She is alert and oriented to person, place, and time. She has normal reflexes. No cranial nerve deficit. Coordination normal.  Skin: Skin is warm and dry. No rash noted. She is not diaphoretic.  Psychiatric: She has a normal mood and affect. Her behavior is normal. Judgment and thought content normal.  Vitals reviewed.         Assessment & Plan:  Weight loss likely due to stress and decreased by mouth intake during husband's illness- weight loss is 11 pounds since 2016. Encourage patient to increase by mouth intake. Recheck in 6 months.  Essential hypertension-stable.Blood pressure is elevated at 150/80 but she says she was aggravated driving a [place] and was afraid she would miss her appointment. She may continue to monitor at home. Pulse is 106 showing some anxiety today.  History of migraine headaches-infrequent  Allergic rhinitis-she thinks she will see Dr. Lucie Leather at his [place] office  History of irritable bowel syndrome  Hyperlipidemia treated with statin medication  Impaired glucose tolerance-diet control  History of vitamin D deficiency  Plan: Return in 6 months or as needed.  Subjective:   Patient presents for Medicare Annual/Subsequent preventive examination.  Review Past Medical/Family/Social:See above   Risk Factors  Current exercise habits:Mostly sedentary  Dietary issues discussed: Low fat low carbohydrate but needs increase calories  Cardiac risk factors:Hyperlipidemia, impaired glucose tolerance, family history of congestive heart failure in mother and father  Depression Screen  (Note: if answer to either of the following is "Yes", a more complete depression screening is indicated)   Over the past two weeks, have you felt down, depressed or hopeless? No  Over the past two weeks, have you felt little interest or pleasure in doing things? No Have you  lost interest or pleasure in daily life? No Do you often feel hopeless? No Do you cry easily over simple problems? No   Activities of Daily Living  In your present state of health, do you have any difficulty performing the following activities?:   Driving? No  Managing money? No  Feeding yourself? No  Getting from bed to chair? No  Climbing a flight of stairs? No  Preparing food and eating?: No  Bathing or showering? No  Getting dressed: No  Getting to the toilet? No  Using the toilet:No  Moving around from place to place: No  In the past year have you fallen or had a near fall?:No  Are you sexually active? No  Do you have more than one partner? No   Hearing Difficulties: No  Do you often ask people to speak up or repeat themselves? Yes due to hearing loss Do you experience ringing or noises in your ears? Yes Do you have difficulty understanding soft or whispered voices? Yes Do you feel that you have a problem with memory? No Do you often misplace items? No    Home Safety:  Do you have a smoke alarm at your residence? Yes Do you have grab bars in the bathroom? No Do you have throw rugs  in your house? No   Cognitive Testing  Alert? Yes Normal Appearance?Yes  Oriented to person? Yes Place? Yes  Time? Yes  Recall of three objects? Yes  Can perform simple calculations? Yes  Displays appropriate judgment?Yes  Can read the correct time from a watch face?Yes   List the Names of Other Physician/Practitioners you currently use:  See referral list for the physicians patient is currently seeing.     Review of Systems: See above   Objective:     General appearance: Appears stated age and mildly obese  Head: Normocephalic, without obvious abnormality, atraumatic  Eyes: conj clear, EOMi PEERLA  Ears: normal TM's and external ear canals both ears  Nose: Nares normal. Septum midline. Mucosa normal. No drainage or sinus tenderness.  Throat: lips, mucosa, and tongue normal;  teeth and gums normal  Neck: no adenopathy, no carotid bruit, no JVD, supple, symmetrical, trachea midline and thyroid not enlarged, symmetric, no tenderness/mass/nodules  No CVA tenderness.  Lungs: clear to auscultation bilaterally  Breasts: normal appearance, no masses or tenderness Heart: regular rate and rhythm, S1, S2 normal, no murmur, click, rub or gallop  Abdomen: soft, non-tender; bowel sounds normal; no masses, no organomegaly  Musculoskeletal: ROM normal in all joints, no crepitus, no deformity, Normal muscle strengthen. Back  is symmetric, no curvature. Skin: Skin color, texture, turgor normal. No rashes or lesions  Lymph nodes: Cervical, supraclavicular, and axillary nodes normal.  Neurologic: CN 2 -12 Normal, Normal symmetric reflexes. Normal coordination and gait  Psych: Alert & Oriented x 3, Mood appear stable.    Assessment:    Annual wellness medicare exam   Plan:    During the course of the visit the patient was educated and counseled about appropriate screening and preventive services including:   Recommend mammogram     Patient Instructions (the written plan) was given to the patient.  Medicare Attestation  I have personally reviewed:  The patient's medical and social history  Their use of alcohol, tobacco or illicit drugs  Their current medications and supplements  The patient's functional ability including ADLs,fall risks, home safety risks, cognitive, and hearing and visual impairment  Diet and physical activities  Evidence for depression or mood disorders  The patient's weight, height, BMI, and visual acuity have been recorded in the chart. I have made referrals, counseling, and provided education to the patient based on review of the above and I have provided the patient with a written personalized care plan for preventive services.

## 2017-02-11 NOTE — Patient Instructions (Signed)
It was pleasure to see you today. Please try to eat more calories and gained some weight. Watch blood pressure at home. Follow-up in 6 months.

## 2017-02-12 LAB — MICROALBUMIN / CREATININE URINE RATIO
CREATININE, URINE: 24 mg/dL (ref 20–320)
Microalb, Ur: <0.2 mg/dL

## 2017-05-15 DIAGNOSIS — J0100 Acute maxillary sinusitis, unspecified: Secondary | ICD-10-CM | POA: Diagnosis not present

## 2017-06-02 DIAGNOSIS — J0100 Acute maxillary sinusitis, unspecified: Secondary | ICD-10-CM | POA: Diagnosis not present

## 2017-07-09 DIAGNOSIS — H9192 Unspecified hearing loss, left ear: Secondary | ICD-10-CM | POA: Diagnosis not present

## 2017-07-09 DIAGNOSIS — I10 Essential (primary) hypertension: Secondary | ICD-10-CM | POA: Diagnosis not present

## 2017-07-09 DIAGNOSIS — G43909 Migraine, unspecified, not intractable, without status migrainosus: Secondary | ICD-10-CM | POA: Diagnosis not present

## 2017-07-09 DIAGNOSIS — H903 Sensorineural hearing loss, bilateral: Secondary | ICD-10-CM | POA: Diagnosis not present

## 2017-07-09 DIAGNOSIS — R42 Dizziness and giddiness: Secondary | ICD-10-CM | POA: Diagnosis not present

## 2017-07-16 ENCOUNTER — Encounter: Payer: Self-pay | Admitting: Internal Medicine

## 2017-07-16 DIAGNOSIS — H9192 Unspecified hearing loss, left ear: Secondary | ICD-10-CM | POA: Diagnosis not present

## 2017-07-22 DIAGNOSIS — G039 Meningitis, unspecified: Secondary | ICD-10-CM | POA: Diagnosis not present

## 2017-07-22 DIAGNOSIS — M47819 Spondylosis without myelopathy or radiculopathy, site unspecified: Secondary | ICD-10-CM | POA: Diagnosis not present

## 2017-07-22 DIAGNOSIS — H9193 Unspecified hearing loss, bilateral: Secondary | ICD-10-CM | POA: Diagnosis not present

## 2017-07-22 DIAGNOSIS — R531 Weakness: Secondary | ICD-10-CM | POA: Diagnosis not present

## 2017-07-22 DIAGNOSIS — G9389 Other specified disorders of brain: Secondary | ICD-10-CM | POA: Diagnosis not present

## 2017-07-22 DIAGNOSIS — R42 Dizziness and giddiness: Secondary | ICD-10-CM | POA: Diagnosis not present

## 2017-07-24 DIAGNOSIS — R930 Abnormal findings on diagnostic imaging of skull and head, not elsewhere classified: Secondary | ICD-10-CM | POA: Diagnosis not present

## 2017-07-24 DIAGNOSIS — H9191 Unspecified hearing loss, right ear: Secondary | ICD-10-CM | POA: Diagnosis not present

## 2017-07-24 DIAGNOSIS — R42 Dizziness and giddiness: Secondary | ICD-10-CM | POA: Diagnosis not present

## 2017-08-12 ENCOUNTER — Encounter: Payer: Self-pay | Admitting: Internal Medicine

## 2017-08-12 ENCOUNTER — Ambulatory Visit (INDEPENDENT_AMBULATORY_CARE_PROVIDER_SITE_OTHER): Payer: Medicare Other | Admitting: Internal Medicine

## 2017-08-12 VITALS — BP 154/74 | HR 102 | Temp 98.4°F | Wt 101.0 lb

## 2017-08-12 DIAGNOSIS — I10 Essential (primary) hypertension: Secondary | ICD-10-CM | POA: Diagnosis not present

## 2017-08-12 DIAGNOSIS — J0100 Acute maxillary sinusitis, unspecified: Secondary | ICD-10-CM

## 2017-08-12 DIAGNOSIS — Z23 Encounter for immunization: Secondary | ICD-10-CM | POA: Diagnosis not present

## 2017-08-12 DIAGNOSIS — J302 Other seasonal allergic rhinitis: Secondary | ICD-10-CM

## 2017-08-12 MED ORDER — METHYLPREDNISOLONE ACETATE 80 MG/ML IJ SUSP
80.0000 mg | Freq: Once | INTRAMUSCULAR | Status: AC
  Administered 2017-08-12: 80 mg via INTRAMUSCULAR

## 2017-08-12 MED ORDER — AZITHROMYCIN 250 MG PO TABS: ORAL_TABLET | ORAL | 0 refills | Status: DC

## 2017-08-12 NOTE — Patient Instructions (Signed)
Continue same meds. Depomedrol given for  Sinus draigace. Z-pak for sinusitis. RTC in 6 months

## 2017-08-12 NOTE — Progress Notes (Signed)
   Subjective:    Patient ID: Anna Ewing, female    DOB: August 13, 1929, 83 y.o.   MRN: [id number]  HPI 83 year old Female in today for six-month recheck. She's been bothered with postnasal drip. Now has discolored nasal drainage and think she has a sinus infection.  Was seen recently by ENT physician at [facility] regarding? Abnormal MRI of the brain. It was felt to be a finding similar to the MRI she had several years ago at [facility]. Reason it was done was because of worsening of her hearing loss.  She's had some vertigo recently. She has meclizine to take for that.   Review of SystemsSee above     Objective:   Physical Exam  Skin warm and dry. TMs are clear. Pharynx is slightly injected. She sounds nasally congested. Neck is supple. Chest clear to auscultation. Cardiac exam 2/6 systolic ejection murmur. Extremities without edema.      Assessment & Plan:  Allergic rhinitis  Maxillary sinusitis  Vertigo  Plan: May take meclizine as needed for vertigo. Depo-Medrol 80 mg IM for allergic rhinitis symptoms. Zithromax Z-PAK take 2 tablets day one followed by 1 tablet days 2 through 5. Return in 6 months for physical examination.

## 2018-01-08 ENCOUNTER — Other Ambulatory Visit: Payer: Self-pay | Admitting: Internal Medicine

## 2018-01-28 ENCOUNTER — Other Ambulatory Visit: Payer: Self-pay | Admitting: Internal Medicine

## 2018-01-28 DIAGNOSIS — M810 Age-related osteoporosis without current pathological fracture: Secondary | ICD-10-CM

## 2018-01-28 DIAGNOSIS — R7302 Impaired glucose tolerance (oral): Secondary | ICD-10-CM

## 2018-01-28 DIAGNOSIS — E559 Vitamin D deficiency, unspecified: Secondary | ICD-10-CM

## 2018-01-28 DIAGNOSIS — K589 Irritable bowel syndrome without diarrhea: Secondary | ICD-10-CM

## 2018-01-28 DIAGNOSIS — I10 Essential (primary) hypertension: Secondary | ICD-10-CM

## 2018-01-28 DIAGNOSIS — E7849 Other hyperlipidemia: Secondary | ICD-10-CM

## 2018-02-05 ENCOUNTER — Other Ambulatory Visit: Payer: Self-pay | Admitting: Internal Medicine

## 2018-02-12 ENCOUNTER — Encounter: Payer: Self-pay | Admitting: Internal Medicine

## 2018-02-12 ENCOUNTER — Ambulatory Visit (INDEPENDENT_AMBULATORY_CARE_PROVIDER_SITE_OTHER): Payer: Medicare Other | Admitting: Internal Medicine

## 2018-02-12 VITALS — BP 130/80 | HR 106 | Ht 59.5 in | Wt 100.0 lb

## 2018-02-12 DIAGNOSIS — R7302 Impaired glucose tolerance (oral): Secondary | ICD-10-CM | POA: Diagnosis not present

## 2018-02-12 DIAGNOSIS — I10 Essential (primary) hypertension: Secondary | ICD-10-CM

## 2018-02-12 DIAGNOSIS — M810 Age-related osteoporosis without current pathological fracture: Secondary | ICD-10-CM

## 2018-02-12 DIAGNOSIS — E559 Vitamin D deficiency, unspecified: Secondary | ICD-10-CM | POA: Diagnosis not present

## 2018-02-12 DIAGNOSIS — J302 Other seasonal allergic rhinitis: Secondary | ICD-10-CM | POA: Diagnosis not present

## 2018-02-12 DIAGNOSIS — K589 Irritable bowel syndrome without diarrhea: Secondary | ICD-10-CM

## 2018-02-12 DIAGNOSIS — J0100 Acute maxillary sinusitis, unspecified: Secondary | ICD-10-CM | POA: Diagnosis not present

## 2018-02-12 DIAGNOSIS — Z0000 Encounter for general adult medical examination without abnormal findings: Secondary | ICD-10-CM | POA: Diagnosis not present

## 2018-02-12 DIAGNOSIS — R011 Cardiac murmur, unspecified: Secondary | ICD-10-CM

## 2018-02-12 DIAGNOSIS — E7849 Other hyperlipidemia: Secondary | ICD-10-CM

## 2018-02-12 MED ORDER — AZITHROMYCIN 250 MG PO TABS: ORAL_TABLET | ORAL | 0 refills | Status: DC

## 2018-02-12 MED ORDER — METHYLPREDNISOLONE ACETATE 80 MG/ML IJ SUSP
80.0000 mg | Freq: Once | INTRAMUSCULAR | Status: AC
  Administered 2018-02-12: 80 mg via INTRAMUSCULAR

## 2018-02-12 NOTE — Progress Notes (Signed)
Subjective:    Patient ID: Anna Ewing, female    DOB: [birth year], 84 y.o.   MRN: [id number]  HPI 84 year old Female for Medicare wellness, routine health maintenance and evaluation of medical issues.  Issues with seasonal allergies with nasal congestion and some discolored drainage today.  Weight seems to be pretty stable.  She stopped allergy injections in December 2017.  Husband has some health problems and takes a lot of her time.  He had back surgery and she has to drive him wherever he needs to go.  Denies depression.  She has a history of hypertension, hyperlipidemia, allergic rhinitis, migraine headaches,  glucose intolerance, bilateral hearing loss, irritable bowel syndrome, osteoporosis.  Migraine headaches are infrequent.  She wears hearing aids.  Glucose intolerance is diet-controlled.  Past medical history: Fractured right wrist 1994, lymphangitis of the left upper extremity 1997, TAH/BSO for uterine prolapse.  Cystocele and rectocele repair at [facility] in 2002.  Sinus surgery May 2012.  Had positive ANA in 2002 with negative anti-DNA.  She was seen by rheumatologist who did not think she had connective tissue disease.  History of urticaria dating back to hospital when she is under stress and had recurrence in 2013.  She was sent to [facility] regarding intermittent right hearing loss.  She saw Dr. Lenoria Farrier.  Subsequently was seen by  neurologist and had MRI of the brain which showed dural enhancement and it was not clear what was causing that condition.  She had a negative spinal tap and negative SPEP.  Chest x-ray raised the possibility of sarcoidosis but limited CT did not show active sarcoidosis.  History of cataract extraction right eye November 2014 by Dr. Vonna Kotyk.  Dr. Precious Bard is her optometrist on [address] and she sees him regularly for eye exam.  Social history: She is a retired Haematologist.  Husband is retired from [employer].  She has a high school  education.  She has a son and a daughter.  She is a former smoker but currently does not smoke.  She does not consume alcohol.  Family history: Father died at age 82 of heart failure mother died at age 36 with history of breast cancer that metastasized to the bone as well as congestive heart failure.  Patient had colonoscopy in 2009 by Dr. Matthias Hughs showing.diverticulosis.  He is indicated she should not have any further colonoscopies at her age.    Review of Systems only new complaints are allergy symptoms with discolored nasal drainage and pressure.     Objective:   Physical Exam  Constitutional: She is oriented to person, place, and time. She appears well-developed and well-nourished. No distress.  HENT:  Head: Normocephalic and atraumatic.  Right Ear: External ear normal.  Mouth/Throat: Oropharynx is clear and moist.  Sounds nasally congested  Eyes: Pupils are equal, round, and reactive to light. Conjunctivae and EOM are normal.  Neck: Neck supple. No JVD present. No thyromegaly present.  Cardiovascular: Normal rate and regular rhythm.  Murmur heard. II-III/VI systolic ejection murmur holosystolic.  Has a bit of a honking sound  Pulmonary/Chest: Effort normal and breath sounds normal. No respiratory distress. She has no wheezes.  Abdominal: Soft. Bowel sounds are normal. She exhibits no distension. There is no tenderness. There is no rebound and no guarding.  Genitourinary:  Genitourinary Comments: Deferred  Musculoskeletal: She exhibits no edema.  Lymphadenopathy:    She has no cervical adenopathy.  Neurological: She is alert and oriented to person, place, and  time. She has normal reflexes. No cranial nerve deficit. Coordination normal.  Skin: Skin is warm and dry. No rash noted. She is not diaphoretic.  Psychiatric: She has a normal mood and affect. Her behavior is normal. Judgment and thought content normal.  Vitals reviewed.         Assessment & Plan:  Systolic murmur.  To have 2 D Echo. EKG is WNL.  Asymptomatic without chest pain or shortness of breath.  Essential hypertension-did not take meds this morning  Allergic rhinitis-Depo-Medrol 80 mg IM given  Acute maxillary sinusitis-take Zithromax Z-Pak  History of impaired glucose tolerance diet-controlled but she has  hemoglobin A1c 5.9% which will be treated with diet.  Bilateral hearing loss  History of irritable bowel syndrome.  Hyperlipidemia  History of vitamin D deficiency-vitamin D level is   Infrequent migraine headaches  Plan: Have 2D echocardiogram for evaluation of murmur.  Follow-up in 6-12 months.  Labs are pending as they were drawn today.  Needs to watch her weight.  Subjective:   Patient presents for Medicare Annual/Subsequent preventive examination.  Review Past Medical/Family/Social: See above   Risk Factors  Current exercise habits: Works about the house Dietary issues discussed: Low-fat low carbohydrate but weight needs to be monitored  Cardiac risk factors: Impaired glucose tolerance family history of heart failure in father, and heart failure in her mother    Depression Screen  (Note: if answer to either of the following is "Yes", a more complete depression screening is indicated)   Over the past two weeks, have you felt down, depressed or hopeless? No  Over the past two weeks, have you felt little interest or pleasure in doing things? No Have you lost interest or pleasure in daily life? No Do you often feel hopeless? No Do you cry easily over simple problems? No   Activities of Daily Living  In your present state of health, do you have any difficulty performing the following activities?:   Driving? No  Managing money? No  Feeding yourself? No  Getting from bed to chair? No  Climbing a flight of stairs? No  Preparing food and eating?: No  Bathing or showering? No  Getting dressed: No  Getting to the toilet? No  Using the toilet:No  Moving around from  place to place: No  In the past year have you fallen or had a near fall?:No  Are you sexually active? No  Do you have more than one partner? No   Hearing Difficulties: No  Do you often ask people to speak up or repeat themselves? No  Do you experience ringing or noises in your ears? No  Do you have difficulty understanding soft or whispered voices? No  Do you feel that you have a problem with memory? No Do you often misplace items? No    Home Safety:  Do you have a smoke alarm at your residence? Yes Do you have grab bars in the bathroom?  Yes Do you have throw rugs in your house?  No   Cognitive Testing  Alert? Yes Normal Appearance?Yes  Oriented to person? Yes Place? Yes  Time? Yes  Recall of three objects? Yes  Can perform simple calculations? Yes  Displays appropriate judgment?Yes  Can read the correct time from a watch face?Yes   List the Names of Other Physician/Practitioners you currently use:  See referral list for the physicians patient is currently seeing.     Review of Systems: See above nasal congestion and ear discomfort  Objective:     General appearance: Appears stated age and. Head: Normocephalic, without obvious abnormality, atraumatic  Eyes: conj clear, EOMi PEERLA  Ears: normal TM's and external ear canals both ears  Nose: Nares normal. Septum midline. Mucosa normal. No drainage or sinus tenderness.  Throat: lips, mucosa, and tongue normal; teeth and gums normal  Neck: no adenopathy, no carotid bruit, no JVD, supple, symmetrical, trachea midline and thyroid not enlarged, symmetric, no tenderness/mass/nodules  No CVA tenderness.  Lungs: clear to auscultation bilaterally  Breasts: normal appearance, no masses or tenderness Heart: 2/6 to 3/6 systolic ejection murmur that is holosystolic and has honking quality Abdomen: soft, non-tender; bowel sounds normal; no masses, no organomegaly  Musculoskeletal: ROM normal in all joints, no crepitus, no  deformity, Normal muscle strengthen. Back  is symmetric, no curvature. Skin: Skin color, texture, turgor normal. No rashes or lesions  Lymph nodes: Cervical, supraclavicular, and axillary nodes normal.  Neurologic: CN 2 -12 Normal, Normal symmetric reflexes. Normal coordination and gait  Psych: Alert & Oriented x 3, Mood appear stable.    Assessment:    Annual wellness medicare exam   Plan:    During the course of the visit the patient was educated and counseled about appropriate screening and preventive services including:   No  colonoscopies at her age  Recommend annual flu vaccine     Patient Instructions (the written plan) was given to the patient.  Medicare Attestation  I have personally reviewed:  The patient's medical and social history  Their use of alcohol, tobacco or illicit drugs  Their current medications and supplements  The patient's functional ability including ADLs,fall risks, home safety risks, cognitive, and hearing and visual impairment  Diet and physical activities  Evidence for depression or mood disorders  The patient's weight, height, BMI, and visual acuity have been recorded in the chart. I have made referrals, counseling, and provided education to the patient based on review of the above and I have provided the patient with a written personalized care plan for preventive services.

## 2018-02-12 NOTE — Patient Instructions (Addendum)
Depomedrol  80 mg IM. Zithromax Z pak take as directed. Have Echocardiogram. RTC one year or sooner if necessary depending on lab results.

## 2018-02-13 LAB — CBC WITH DIFFERENTIAL/PLATELET
BASOS PCT: 0.8 %
Basophils Absolute: 54 cells/uL (ref 0–200)
EOS PCT: 4.2 %
Eosinophils Absolute: 281 cells/uL (ref 15–500)
HEMATOCRIT: 36.3 % (ref 35.0–45.0)
HEMOGLOBIN: 12.2 g/dL (ref 11.7–15.5)
LYMPHS ABS: 2131 {cells}/uL (ref 850–3900)
MCH: 29.9 pg (ref 27.0–33.0)
MCHC: 33.6 g/dL (ref 32.0–36.0)
MCV: 89 fL (ref 80.0–100.0)
MPV: 8.7 fL (ref 7.5–12.5)
Monocytes Relative: 9.6 %
NEUTROS ABS: 3591 {cells}/uL (ref 1500–7800)
NEUTROS PCT: 53.6 %
Platelets: 290 10*3/uL (ref 140–400)
RBC: 4.08 10*6/uL (ref 3.80–5.10)
RDW: 12.7 % (ref 11.0–15.0)
Total Lymphocyte: 31.8 %
WBC: 6.7 10*3/uL (ref 3.8–10.8)
WBCMIX: 643 {cells}/uL (ref 200–950)

## 2018-02-13 LAB — POCT URINALYSIS DIPSTICK
Appearance: NORMAL
Bilirubin, UA: NEGATIVE
Blood, UA: NEGATIVE
Glucose, UA: NEGATIVE
KETONES UA: NEGATIVE
Leukocytes, UA: NEGATIVE
NITRITE UA: NEGATIVE
Odor: NORMAL
PH UA: 6.5 (ref 5.0–8.0)
PROTEIN UA: NEGATIVE
SPEC GRAV UA: 1.015 (ref 1.010–1.025)
UROBILINOGEN UA: 0.2 U/dL

## 2018-02-13 LAB — COMPLETE METABOLIC PANEL WITH GFR
AG Ratio: 1.5 (calc) (ref 1.0–2.5)
ALBUMIN MSPROF: 4.4 g/dL (ref 3.6–5.1)
ALKALINE PHOSPHATASE (APISO): 53 U/L (ref 33–130)
ALT: 27 U/L (ref 6–29)
AST: 41 U/L — ABNORMAL HIGH (ref 10–35)
BUN / CREAT RATIO: 20 (calc) (ref 6–22)
BUN: 10 mg/dL (ref 7–25)
CO2: 25 mmol/L (ref 20–32)
CREATININE: 0.5 mg/dL — AB (ref 0.60–0.88)
Calcium: 9.3 mg/dL (ref 8.6–10.4)
Chloride: 95 mmol/L — ABNORMAL LOW (ref 98–110)
GFR, Est African American: 101 mL/min/{1.73_m2} (ref >=60)
GFR, Est Non African American: 87 mL/min/{1.73_m2} (ref >=60)
GLUCOSE: 89 mg/dL (ref 65–99)
Globulin: 2.9 g/dL (calc) (ref 1.9–3.7)
Potassium: 4 mmol/L (ref 3.5–5.3)
Sodium: 130 mmol/L — ABNORMAL LOW (ref 135–146)
Total Bilirubin: 0.6 mg/dL (ref 0.2–1.2)
Total Protein: 7.3 g/dL (ref 6.1–8.1)

## 2018-02-13 LAB — HEMOGLOBIN A1C
EAG (MMOL/L): 6.8 (calc)
HEMOGLOBIN A1C: 5.9 %{Hb} — AB (ref <5.7)
Mean Plasma Glucose: 123 (calc)

## 2018-02-13 LAB — LIPID PANEL
Cholesterol: 246 mg/dL — ABNORMAL HIGH (ref <200)
HDL: 79 mg/dL (ref >50)
LDL Cholesterol (Calc): 149 mg/dL (calc) — ABNORMAL HIGH
Non-HDL Cholesterol (Calc): 167 mg/dL (calc) — ABNORMAL HIGH (ref <130)
Total CHOL/HDL Ratio: 3.1 (calc) (ref <5.0)
Triglycerides: 78 mg/dL (ref <150)

## 2018-02-13 LAB — VITAMIN D 25 HYDROXY (VIT D DEFICIENCY, FRACTURES): Vit D, 25-Hydroxy: 13 ng/mL — ABNORMAL LOW (ref 30–100)

## 2018-02-13 LAB — MICROALBUMIN / CREATININE URINE RATIO
Creatinine, Urine: 32 mg/dL (ref 20–275)
MICROALB UR: 0.6 mg/dL
MICROALB/CREAT RATIO: 19 ug/mg{creat} (ref <30)

## 2018-02-13 LAB — TSH: TSH: 1.69 mIU/L (ref 0.40–4.50)

## 2018-02-17 ENCOUNTER — Encounter: Payer: Self-pay | Admitting: Internal Medicine

## 2018-02-17 ENCOUNTER — Other Ambulatory Visit: Payer: Self-pay | Admitting: Internal Medicine

## 2018-02-17 ENCOUNTER — Ambulatory Visit: Payer: Medicare Other | Admitting: Internal Medicine

## 2018-02-17 VITALS — BP 140/70 | HR 103 | Ht 59.5 in | Wt 100.0 lb

## 2018-02-17 DIAGNOSIS — E559 Vitamin D deficiency, unspecified: Secondary | ICD-10-CM

## 2018-02-17 DIAGNOSIS — R7302 Impaired glucose tolerance (oral): Secondary | ICD-10-CM | POA: Diagnosis not present

## 2018-02-17 DIAGNOSIS — E7849 Other hyperlipidemia: Secondary | ICD-10-CM | POA: Diagnosis not present

## 2018-02-17 DIAGNOSIS — E871 Hypo-osmolality and hyponatremia: Secondary | ICD-10-CM | POA: Diagnosis not present

## 2018-02-17 LAB — BASIC METABOLIC PANEL
BUN / CREAT RATIO: 18 (calc) (ref 6–22)
BUN: 10 mg/dL (ref 7–25)
CALCIUM: 9.3 mg/dL (ref 8.6–10.4)
CO2: 26 mmol/L (ref 20–32)
Chloride: 95 mmol/L — ABNORMAL LOW (ref 98–110)
Creat: 0.56 mg/dL — ABNORMAL LOW (ref 0.60–0.88)
Glucose, Bld: 191 mg/dL — ABNORMAL HIGH (ref 65–99)
POTASSIUM: 4 mmol/L (ref 3.5–5.3)
SODIUM: 128 mmol/L — AB (ref 135–146)

## 2018-02-17 MED ORDER — ROSUVASTATIN CALCIUM 5 MG PO TABS: 5.0000 mg | ORAL_TABLET | Freq: Every day | ORAL | 3 refills | Status: DC

## 2018-02-17 NOTE — Progress Notes (Signed)
   Subjective:    Patient ID: Anna Ewing, female    DOB: 09/30/1929, 84 y.o.   MRN: [id number]  HPI Here today to discuss hyponatremia.  The last time she had a normal sodium was in 2013.  Since that time her sodium is gradually decreased.  In 2016 it was 133.  In 2018 it was 132 and most recently it was 130.  She is asymptomatic.    Her urine specimen shows a normal specific gravity.  Since this goes back several years I think we can continue to monitor it.  However I do think she is getting a bit forgetful but I doubt that is related to the sodium issue.  She also has vitamin D deficiency with level being 13 and we will place her on 50,000 units vitamin D3 weekly for 12 weeks.  Last year her vitamin D was 19.  She has hyperlipidemia with total cholesterol 246 and an LDL cholesterol of 149.  A year ago total cholesterol was 229 with an LDL cholesterol of 133.  At her age I think we will just continue to monitor this.  She also has some impaired glucose tolerance  which is stable from last year at 5.9%.    Review of Systems     Objective:   Physical Exam  She was not examined today but I spent 25 minutes speaking with her about these issues.  She understands she will take high-dose vitamin D for 12 weeks.  She will continue to watch her intake and  fat in her diet      Assessment & Plan:  Vitamin D deficiency  Hyperlipidemia-continue to monitor will not treat  Impaired glucose tolerance-stable at 5.9%  Plan: I will have her return for urine sodium and basic metabolic panel when she is well-hydrated.

## 2018-02-24 ENCOUNTER — Ambulatory Visit (INDEPENDENT_AMBULATORY_CARE_PROVIDER_SITE_OTHER): Payer: Medicare Other | Admitting: Internal Medicine

## 2018-02-24 ENCOUNTER — Encounter: Payer: Self-pay | Admitting: Internal Medicine

## 2018-02-24 VITALS — BP 140/70 | HR 92 | Ht 59.5 in | Wt 101.0 lb

## 2018-02-24 DIAGNOSIS — R7989 Other specified abnormal findings of blood chemistry: Secondary | ICD-10-CM

## 2018-02-24 LAB — POCT URINALYSIS DIPSTICK
APPEARANCE: NORMAL
BILIRUBIN UA: NEGATIVE
Blood, UA: NEGATIVE
GLUCOSE UA: NEGATIVE
Ketones, UA: NEGATIVE
Leukocytes, UA: NEGATIVE
Nitrite, UA: NORMAL
ODOR: NORMAL
PH UA: 6.5 (ref 5.0–8.0)
Protein, UA: NEGATIVE
Spec Grav, UA: 1.015 (ref 1.010–1.025)
UROBILINOGEN UA: 0.2 U/dL

## 2018-02-24 NOTE — Progress Notes (Signed)
   Subjective:    Patient ID: Anna Ewing, female    DOB: 04/23/1929, 84 y.o.   MRN: [id number]  HPI  84 year old here for follow up of hyponatremia. At last visit April 2 this was persistent at 128 and lower than last measurement of 130 on March 28. She is not on diuretic therapy. I do not see any meds that routinely caused this. Have obtained urine sodium today.  She does seem to be getting a little forgetful.  I do not think that is related to hyponatremia but rather to age.  We have reviewed over the past several years her sodium levels and they have been low for some time but this is the lowest of seen.  Patient was advised to come well-hydrated.  We are going to obtain a urine sodium today.   Review of Systems see above     Objective:   Physical Exam Spent about 15 minutes speaking with her about this issue.  She says she understands.       Assessment & Plan:  Hyponatremia  Plan: Urine sodium obtained with further instructions to follow.  Addendum: Urine sodium is normal.  She is tolerating the hyponatremia.  We will follow-up in October.

## 2018-02-25 ENCOUNTER — Telehealth: Payer: Self-pay

## 2018-02-25 LAB — SODIUM, URINE, RANDOM: SODIUM UR: 37 mmol/L (ref 28–272)

## 2018-02-25 NOTE — Telephone Encounter (Signed)
Pt's son called is requesting an update on pt's urine sodium. Cb# [phone]

## 2018-02-25 NOTE — Telephone Encounter (Signed)
Marcelino DusterMichelle contacted Fayrene FearingJames and scheduled an appt for patient.

## 2018-02-25 NOTE — Telephone Encounter (Signed)
Pt denies son called. Told her urine sodium was normal. This number provided above has been disconnected. Told pt we will recheck sodium in 3 weeks. She has a normal urine sodium- not sure shy serum sodium is low. No meds causing it that I can see.

## 2018-03-04 ENCOUNTER — Ambulatory Visit (HOSPITAL_COMMUNITY): Payer: Medicare Other

## 2018-03-06 ENCOUNTER — Other Ambulatory Visit (HOSPITAL_COMMUNITY): Payer: Medicare Other

## 2018-03-10 ENCOUNTER — Ambulatory Visit: Payer: Self-pay | Admitting: Internal Medicine

## 2018-03-10 ENCOUNTER — Ambulatory Visit (HOSPITAL_COMMUNITY): Payer: Medicare Other | Attending: Internal Medicine

## 2018-03-10 ENCOUNTER — Other Ambulatory Visit: Payer: Self-pay

## 2018-03-10 DIAGNOSIS — E785 Hyperlipidemia, unspecified: Secondary | ICD-10-CM | POA: Diagnosis not present

## 2018-03-10 DIAGNOSIS — R011 Cardiac murmur, unspecified: Secondary | ICD-10-CM | POA: Diagnosis not present

## 2018-03-10 DIAGNOSIS — I119 Hypertensive heart disease without heart failure: Secondary | ICD-10-CM | POA: Insufficient documentation

## 2018-03-10 DIAGNOSIS — Z87891 Personal history of nicotine dependence: Secondary | ICD-10-CM | POA: Insufficient documentation

## 2018-03-10 DIAGNOSIS — I081 Rheumatic disorders of both mitral and tricuspid valves: Secondary | ICD-10-CM | POA: Insufficient documentation

## 2018-03-11 NOTE — Progress Notes (Signed)
Called [facility] and gave her message about study of the murmur, patient verbalized understanding.

## 2018-03-14 ENCOUNTER — Other Ambulatory Visit: Payer: Self-pay | Admitting: Internal Medicine

## 2018-03-15 NOTE — Patient Instructions (Addendum)
Urine sodium with further instructions to follow.  Addendum: Urine sodium is normal.  She is tolerating hyponatremia.  We will follow-up with this in October.

## 2018-03-15 NOTE — Patient Instructions (Addendum)
Watch carbohydrate intake and watch fat in your diet.  Take 50,000 units vitamin D3 weekly for 12 weeks due to vitamin D deficiency.  Return next week for urine sodium level 1 well-hydrated

## 2018-05-10 ENCOUNTER — Other Ambulatory Visit: Payer: Self-pay | Admitting: Internal Medicine

## 2018-05-29 ENCOUNTER — Telehealth: Payer: Self-pay | Admitting: Internal Medicine

## 2018-05-29 MED ORDER — AMLODIPINE BESYLATE 5 MG PO TABS: 5.0000 mg | ORAL_TABLET | Freq: Every day | ORAL | 1 refills | Status: DC

## 2018-05-29 NOTE — Telephone Encounter (Signed)
Julius BowelsSeria Vandehei Self [phone]  [facility] [place]    amLODipine (NORVASC) 5 MG tablet    Onnika called to say she is out of her  amLODipine (NORVASC) 5 MG tablet   And the bottle said no refills, so she was calling to get refill, I also advised her to [facility] and they would send electronic request.

## 2018-05-29 NOTE — Telephone Encounter (Signed)
Please refill x 6 months 

## 2018-08-20 ENCOUNTER — Ambulatory Visit: Payer: Self-pay | Admitting: Internal Medicine

## 2018-08-24 ENCOUNTER — Ambulatory Visit: Payer: Medicare Other | Admitting: Internal Medicine

## 2018-08-24 ENCOUNTER — Encounter: Payer: Self-pay | Admitting: Internal Medicine

## 2018-08-24 VITALS — BP 130/80 | HR 106 | Temp 98.3°F | Ht 59.5 in | Wt 104.0 lb

## 2018-08-24 DIAGNOSIS — Z5181 Encounter for therapeutic drug level monitoring: Secondary | ICD-10-CM

## 2018-08-24 DIAGNOSIS — E559 Vitamin D deficiency, unspecified: Secondary | ICD-10-CM

## 2018-08-24 DIAGNOSIS — E785 Hyperlipidemia, unspecified: Secondary | ICD-10-CM | POA: Diagnosis not present

## 2018-08-24 DIAGNOSIS — Z23 Encounter for immunization: Secondary | ICD-10-CM

## 2018-08-24 DIAGNOSIS — J0100 Acute maxillary sinusitis, unspecified: Secondary | ICD-10-CM | POA: Diagnosis not present

## 2018-08-24 DIAGNOSIS — R7302 Impaired glucose tolerance (oral): Secondary | ICD-10-CM | POA: Diagnosis not present

## 2018-08-24 DIAGNOSIS — E871 Hypo-osmolality and hyponatremia: Secondary | ICD-10-CM

## 2018-08-24 DIAGNOSIS — Z79899 Other long term (current) drug therapy: Secondary | ICD-10-CM

## 2018-08-24 DIAGNOSIS — I10 Essential (primary) hypertension: Secondary | ICD-10-CM

## 2018-08-25 DIAGNOSIS — E871 Hypo-osmolality and hyponatremia: Secondary | ICD-10-CM | POA: Insufficient documentation

## 2018-08-25 LAB — HEMOGLOBIN A1C
EAG (MMOL/L): 7.1 (calc)
Hgb A1c MFr Bld: 6.1 % of total Hgb — ABNORMAL HIGH (ref <5.7)
Mean Plasma Glucose: 128 (calc)

## 2018-08-25 LAB — LIPID PANEL
Cholesterol: 183 mg/dL (ref <200)
HDL: 68 mg/dL (ref >50)
LDL CHOLESTEROL (CALC): 101 mg/dL — AB
Non-HDL Cholesterol (Calc): 115 mg/dL (calc) (ref <130)
TRIGLYCERIDES: 58 mg/dL (ref <150)
Total CHOL/HDL Ratio: 2.7 (calc) (ref <5.0)

## 2018-08-25 LAB — HEPATIC FUNCTION PANEL
AG Ratio: 1.3 (calc) (ref 1.0–2.5)
ALBUMIN MSPROF: 4.1 g/dL (ref 3.6–5.1)
ALKALINE PHOSPHATASE (APISO): 43 U/L (ref 33–130)
ALT: 18 U/L (ref 6–29)
AST: 30 U/L (ref 10–35)
Bilirubin, Direct: 0.1 mg/dL (ref 0.0–0.2)
Globulin: 3.2 g/dL (calc) (ref 1.9–3.7)
Indirect Bilirubin: 0.3 mg/dL (calc) (ref 0.2–1.2)
TOTAL PROTEIN: 7.3 g/dL (ref 6.1–8.1)
Total Bilirubin: 0.4 mg/dL (ref 0.2–1.2)

## 2018-08-25 LAB — BASIC METABOLIC PANEL
BUN/Creatinine Ratio: 16 (calc) (ref 6–22)
BUN: 8 mg/dL (ref 7–25)
CO2: 25 mmol/L (ref 20–32)
Calcium: 9.4 mg/dL (ref 8.6–10.4)
Chloride: 96 mmol/L — ABNORMAL LOW (ref 98–110)
Creat: 0.49 mg/dL — ABNORMAL LOW (ref 0.60–0.88)
Glucose, Bld: 99 mg/dL (ref 65–99)
POTASSIUM: 4.3 mmol/L (ref 3.5–5.3)
SODIUM: 130 mmol/L — AB (ref 135–146)

## 2018-08-25 LAB — VITAMIN D 25 HYDROXY (VIT D DEFICIENCY, FRACTURES): VIT D 25 HYDROXY: 18 ng/mL — AB (ref 30–100)

## 2018-08-25 LAB — SODIUM, URINE, RANDOM: Sodium, Ur: 54 mmol/L (ref 28–272)

## 2018-08-25 MED ORDER — ERGOCALCIFEROL 1.25 MG (50000 UT) PO CAPS: ORAL_CAPSULE | ORAL | 1 refills | Status: DC

## 2018-08-25 MED ORDER — AZITHROMYCIN 250 MG PO TABS: ORAL_TABLET | ORAL | 0 refills | Status: DC

## 2018-08-25 NOTE — Progress Notes (Signed)
   Subjective:    Patient ID: Anna Ewing, female    DOB: September 07, 1929, 84 y.o.   MRN: [id number]  HPI  84 year old Female for 6 month recheck. Says she has come down with URI symptoms and has post nasal drip and maxillary sinus pressure.  Here to follow up on hyponatremia, impaired glucose tolerance, and HTN. She also has Vitamin D deficiency. I am going to prescribe high dose Vitamin D weekly for her for 6 months. Level is 18. Sodium is 130. Urine sodium normal at 54 ruling out dehydration. Continue to follow. Hgb AIc is 6.1 %. Continue to watch diet. Lipid and liver panels stable.    Review of Systems no fever or chills. Flu vaccine given     Objective:   Physical Exam  Constitutional: No distress.  Thin elderly Female NAD  HENT:  Head: Normocephalic and atraumatic.  Left Ear: External ear normal.  Mouth/Throat: Oropharynx is clear and moist.  Right TM dull but not red. Sounds nasally congested.  Eyes: Conjunctivae are normal. Right eye exhibits no discharge. Left eye exhibits no discharge.  Neck: No JVD present. No thyromegaly present.  Cardiovascular: Normal rate, regular rhythm and normal heart sounds. Exam reveals no gallop and no friction rub.  No murmur heard. Pulmonary/Chest: Effort normal and breath sounds normal. No stridor. No respiratory distress. She has no wheezes.  Lymphadenopathy:    She has no cervical adenopathy.  Psychiatric: She has a normal mood and affect. Her behavior is normal.  Vitals reviewed.         Assessment & Plan:  Essential HTN- stable on amlodipine  Acute maxillary sinusitis. No fever but sounds very congested. Rx: Zithromax Z pak 2 po day 1 followed by one po days 2-5.  Vitamin D deficiency- 50,0000 units Drisdol weekly x 6 months. Follow up at CPE  Hyponatremia - level stable at 130.  Hyperlipidemia- stable and essentially normal on low dose Crestor.  Weight loss. At visit in Spring weighed 100 pounds. Now weighs 104 pounds.  Continue to monitor.Serum proteins were stable in March 2019.  Health maintenance- flu vaccine given RTC 6 months for CPE. Continue same meds. 25 minutes spent with patient evaluating these medical issues.

## 2018-08-25 NOTE — Patient Instructions (Signed)
Continue same meds as prescribed. Add Drisdol 50,000 units weekly x 6 months. Ziithromax for sinusitis. Flu vaccine given.

## 2018-12-05 ENCOUNTER — Other Ambulatory Visit: Payer: Self-pay | Admitting: Internal Medicine

## 2019-05-13 ENCOUNTER — Telehealth: Payer: Self-pay | Admitting: Internal Medicine

## 2019-05-13 ENCOUNTER — Encounter: Payer: Self-pay | Admitting: Internal Medicine

## 2019-05-13 MED ORDER — ROSUVASTATIN CALCIUM 5 MG PO TABS: 5.0000 mg | ORAL_TABLET | Freq: Every day | ORAL | 3 refills | Status: DC

## 2019-05-13 NOTE — Telephone Encounter (Signed)
Patient has physical exam scheduled for July.  Refill Crestor for 1 year.

## 2019-05-27 ENCOUNTER — Other Ambulatory Visit: Payer: Self-pay

## 2019-05-27 ENCOUNTER — Other Ambulatory Visit: Payer: Medicare Other | Admitting: Internal Medicine

## 2019-05-27 VITALS — Temp 98.3°F

## 2019-05-27 DIAGNOSIS — I10 Essential (primary) hypertension: Secondary | ICD-10-CM | POA: Diagnosis not present

## 2019-05-27 DIAGNOSIS — Z0000 Encounter for general adult medical examination without abnormal findings: Secondary | ICD-10-CM | POA: Diagnosis not present

## 2019-05-27 DIAGNOSIS — R7302 Impaired glucose tolerance (oral): Secondary | ICD-10-CM | POA: Diagnosis not present

## 2019-05-27 DIAGNOSIS — E785 Hyperlipidemia, unspecified: Secondary | ICD-10-CM

## 2019-05-28 LAB — LIPID PANEL
Cholesterol: 184 mg/dL (ref <200)
HDL: 70 mg/dL (ref >=50)
LDL Cholesterol (Calc): 96 mg/dL (calc)
Non-HDL Cholesterol (Calc): 114 mg/dL (calc) (ref <130)
Total CHOL/HDL Ratio: 2.6 (calc) (ref <5.0)
Triglycerides: 89 mg/dL (ref <150)

## 2019-05-28 LAB — CBC WITH DIFFERENTIAL/PLATELET
Absolute Monocytes: 653 cells/uL (ref 200–950)
Basophils Absolute: 41 cells/uL (ref 0–200)
Basophils Relative: 0.6 %
Eosinophils Absolute: 367 cells/uL (ref 15–500)
Eosinophils Relative: 5.4 %
HCT: 36.2 % (ref 35.0–45.0)
Hemoglobin: 12.1 g/dL (ref 11.7–15.5)
Lymphs Abs: 1863 cells/uL (ref 850–3900)
MCH: 30 pg (ref 27.0–33.0)
MCHC: 33.4 g/dL (ref 32.0–36.0)
MCV: 89.8 fL (ref 80.0–100.0)
MPV: 8.9 fL (ref 7.5–12.5)
Monocytes Relative: 9.6 %
Neutro Abs: 3876 cells/uL (ref 1500–7800)
Neutrophils Relative %: 57 %
Platelets: 313 10*3/uL (ref 140–400)
RBC: 4.03 10*6/uL (ref 3.80–5.10)
RDW: 13.3 % (ref 11.0–15.0)
Total Lymphocyte: 27.4 %
WBC: 6.8 10*3/uL (ref 3.8–10.8)

## 2019-05-28 LAB — COMPLETE METABOLIC PANEL WITH GFR
AG Ratio: 1.5 (calc) (ref 1.0–2.5)
ALT: 15 U/L (ref 6–29)
AST: 27 U/L (ref 10–35)
Albumin: 4.4 g/dL (ref 3.6–5.1)
Alkaline phosphatase (APISO): 47 U/L (ref 37–153)
BUN/Creatinine Ratio: 12 (calc) (ref 6–22)
BUN: 7 mg/dL (ref 7–25)
CO2: 25 mmol/L (ref 20–32)
Calcium: 9.4 mg/dL (ref 8.6–10.4)
Chloride: 98 mmol/L (ref 98–110)
Creat: 0.59 mg/dL — ABNORMAL LOW (ref 0.60–0.88)
GFR, Est African American: 95 mL/min/{1.73_m2} (ref >=60)
GFR, Est Non African American: 82 mL/min/{1.73_m2} (ref >=60)
Globulin: 3 g/dL (calc) (ref 1.9–3.7)
Glucose, Bld: 107 mg/dL — ABNORMAL HIGH (ref 65–99)
Potassium: 4.3 mmol/L (ref 3.5–5.3)
Sodium: 131 mmol/L — ABNORMAL LOW (ref 135–146)
Total Bilirubin: 0.7 mg/dL (ref 0.2–1.2)
Total Protein: 7.4 g/dL (ref 6.1–8.1)

## 2019-05-28 LAB — MICROALBUMIN / CREATININE URINE RATIO
Creatinine, Urine: 103 mg/dL (ref 20–275)
Microalb Creat Ratio: 16 mcg/mg creat (ref <30)
Microalb, Ur: 1.6 mg/dL

## 2019-05-28 LAB — HEMOGLOBIN A1C
Hgb A1c MFr Bld: 6.3 % of total Hgb — ABNORMAL HIGH (ref <5.7)
Mean Plasma Glucose: 134 (calc)
eAG (mmol/L): 7.4 (calc)

## 2019-05-28 LAB — TSH: TSH: 2.09 mIU/L (ref 0.40–4.50)

## 2019-05-31 ENCOUNTER — Other Ambulatory Visit: Payer: Self-pay

## 2019-05-31 ENCOUNTER — Ambulatory Visit (INDEPENDENT_AMBULATORY_CARE_PROVIDER_SITE_OTHER): Payer: Medicare Other | Admitting: Internal Medicine

## 2019-05-31 ENCOUNTER — Encounter: Payer: Self-pay | Admitting: Internal Medicine

## 2019-05-31 VITALS — BP 140/80 | HR 102 | Temp 97.9°F | Ht 59.5 in | Wt 105.0 lb

## 2019-05-31 DIAGNOSIS — E871 Hypo-osmolality and hyponatremia: Secondary | ICD-10-CM | POA: Diagnosis not present

## 2019-05-31 DIAGNOSIS — E7800 Pure hypercholesterolemia, unspecified: Secondary | ICD-10-CM

## 2019-05-31 DIAGNOSIS — H903 Sensorineural hearing loss, bilateral: Secondary | ICD-10-CM

## 2019-05-31 DIAGNOSIS — I10 Essential (primary) hypertension: Secondary | ICD-10-CM | POA: Diagnosis not present

## 2019-05-31 DIAGNOSIS — Z0000 Encounter for general adult medical examination without abnormal findings: Secondary | ICD-10-CM

## 2019-05-31 DIAGNOSIS — R7302 Impaired glucose tolerance (oral): Secondary | ICD-10-CM | POA: Diagnosis not present

## 2019-05-31 DIAGNOSIS — Z8669 Personal history of other diseases of the nervous system and sense organs: Secondary | ICD-10-CM

## 2019-05-31 LAB — POCT URINALYSIS DIPSTICK
Appearance: NEGATIVE
Bilirubin, UA: NEGATIVE
Blood, UA: NEGATIVE
Glucose, UA: NEGATIVE
Ketones, UA: NEGATIVE
Leukocytes, UA: NEGATIVE
Nitrite, UA: NEGATIVE
Odor: NEGATIVE
Protein, UA: NEGATIVE
Spec Grav, UA: 1.01 (ref 1.010–1.025)
Urobilinogen, UA: 0.2 E.U./dL
pH, UA: 6.5 (ref 5.0–8.0)

## 2019-05-31 NOTE — Patient Instructions (Signed)
It was a pleasure to see you today. RTC in one year or as needed. Have flu shot this Fall. Continue same medications.

## 2019-05-31 NOTE — Progress Notes (Signed)
Subjective:    Patient ID: Anna Ewing, female    DOB: 07-Feb-1929, 85 y.o.   MRN: [id number]  HPI   85 year old Female for health maintenance exam, Medicare wellness, and evaluation of medical issues.  Hx systolic murmur. Had Echo last year and had calcified leaflets that cardiologist did not think were of any significance.  Hx anxiety, hearing loss with bilateral hearing aids.      She stopped taking allergy injections in December 2017.  History of hypertension, hyperlipidemia, allergic rhinitis, migraine headaches, glucose intolerance, bilateral hearing loss, irritable bowel syndrome and osteoporosis.  She wears hearing aids.  Longstanding history of mild hyponatremia at 131.  Have evaluated previously with serum and urine sodium.  Last year urine sodium was 54 and 37.  Continue to monitor.  She seems to be asymptomatic.  I do not see any medications that would cause this.  Glucose intolerance is diet-controlled.  Past medical history: Fractured right wrist 1994, lymphangitis of the left upper extremity 1997, TAH/BSO for uterine prolapse.  Cystocele and rectocele repair at [facility] in 2002.  Sinus surgery May 2012.  Had positive ANA in 2002 with negative anti-DNA.  She was seen by rheumatologist who did not think she had connective tissue disease.  History of urticaria which seem to be aggravated by stress.  Had recurrence in 2013.  She saw Dr. Brayton Mars at  [facility] regarding intermittent right hearing loss several years ago.  Subsequently was seen by neurologist and had MRI of the brain which showed dural enhancement and it was not clear what was causing that condition.  She had a negative spinal tap and negative SPEP.  Chest x-ray raised the possibility of sarcoidosis but limited CT did not show active sarcoidosis.  She had colonoscopy in 2009 by Dr. Cristina Gong showing diverticulosis.  He indicated she should not have any further colonoscopies at her age.  History of  cataract extraction right eye November 2014 by Dr. Talbert Forest.  Dr. Renaldo Fiddler is her optometrist in [place] and she sees him regularly for eye exam.  Patient continues to drive.  Says husband has health problems.  I think she is a bit more forgetful.   Social history: She is a retired Secretary/administrator.  Husband is retired from [employer].  She has a high school education.  She has a son and a daughter.  She is a former smoker.  Does not consume alcohol.  Family history: Father died at age 82 of heart failure.  Mother died at age 35 with history of breast cancer that metastasized to the bones as well as congestive heart failure.       Review of Systems  Constitutional: Negative.   Respiratory: Negative.   Cardiovascular: Negative.   Gastrointestinal: Negative.   Genitourinary: Negative.   Neurological:       Bilateral hearing loss  All other systems reviewed and are negative.      Objective:   Physical Exam Vitals signs reviewed.  Constitutional:      Appearance: Normal appearance. She is not diaphoretic.  HENT:     Head: Normocephalic and atraumatic.     Right Ear: Tympanic membrane normal.     Left Ear: Tympanic membrane normal.     Nose: Nose normal.     Mouth/Throat:     Mouth: Mucous membranes are moist.     Pharynx: Oropharynx is clear.  Eyes:     General: No scleral icterus.       Right  eye: No discharge.        Left eye: No discharge.     Pupils: Pupils are equal, round, and reactive to light.  Neck:     Musculoskeletal: Neck supple. No neck rigidity.  Cardiovascular:     Rate and Rhythm: Normal rate and regular rhythm.     Heart sounds: Normal heart sounds. No murmur.  Pulmonary:     Effort: Pulmonary effort is normal. No respiratory distress.     Breath sounds: Normal breath sounds. No wheezing.  Abdominal:     General: Bowel sounds are normal.     Palpations: Abdomen is soft. There is no mass.     Tenderness: There is no abdominal tenderness.  Musculoskeletal:      Right lower leg: No edema.     Left lower leg: No edema.  Skin:    General: Skin is warm and dry.  Neurological:     General: No focal deficit present.     Mental Status: She is alert and oriented to person, place, and time.     Cranial Nerves: No cranial nerve deficit.     Motor: No weakness.     Coordination: Coordination normal.  Psychiatric:        Mood and Affect: Mood normal.        Behavior: Behavior normal.        Thought Content: Thought content normal.        Judgment: Judgment normal.           Assessment & Plan:  Systolic murmur evaluated last year by echocardiogram.  She is asymptomatic.  It is not thought to be significant.  Essential hypertension stable at 140/80 just on amlodipine  BMI 20.85 and stable.  Height is 4 feet 11.5 inches and weight is 105 pounds.  History of impaired glucose -hemoglobin A1c 6.3%  Bilateral hearing loss treated with hearing aids  Hyperlipidemia stable on low-dose Crestor  Irritable bowel syndrome  Infrequent migraine headaches  I think mild memory loss due to age  Plan: Return in 1 year or as needed.  Subjective:   Patient presents for Medicare Annual/Subsequent preventive examination.  Review Past Medical/Family/Social: See above   Risk Factors  Current exercise habits: Mostly sedentary but walks some Dietary issues discussed: Low-fat low carbohydrate  Cardiac risk factors: Hyperlipidemia, hypertension  Depression Screen  (Note: if answer to either of the following is "Yes", a more complete depression screening is indicated)   Over the past two weeks, have you felt down, depressed or hopeless? No  Over the past two weeks, have you felt little interest or pleasure in doing things? No Have you lost interest or pleasure in daily life? No Do you often feel hopeless? No Do you cry easily over simple problems? No   Activities of Daily Living  In your present state of health, do you have any difficulty performing  the following activities?:   Driving? No  Managing money? No  Feeding yourself? No  Getting from bed to chair? No  Climbing a flight of stairs? No  Preparing food and eating?: No  Bathing or showering? No  Getting dressed: No  Getting to the toilet? No  Using the toilet:No  Moving around from place to place: No  In the past year have you fallen or had a near fall?:No  Are you sexually active? No  Do you have more than one partner? No   Hearing Difficulties: No  Do you often ask people to speak  up or repeat themselves?  Yes wears hearing aids Do you experience ringing or noises in your ears? No  Do you have difficulty understanding soft or whispered voices?  Yes wears hearing aids Do you feel that you have a problem with memory? No Do you often misplace items? No    Home Safety:  Do you have a smoke alarm at your residence? Yes Do you have grab bars in the bathroom?  None Do you have throw rugs in your house?  No   Cognitive Testing  Alert? Yes Normal Appearance?Yes  Oriented to person? Yes Place? Yes  Time? Yes  Recall of three objects?  Not tested Can perform simple calculations?  Yes Displays appropriate judgment?Yes  Can read the correct time from a watch face?Yes   List the Names of Other Physician/Practitioners you currently use:  See referral list for the physicians patient is currently seeing.     Review of Systems: See above   Objective:     General appearance: Appears stated age and thin Head: Normocephalic, without obvious abnormality, atraumatic  Eyes: conj clear, EOMi PEERLA  Ears: normal TM's and external ear canals both ears  Nose: Nares normal. Septum midline. Mucosa normal. No drainage or sinus tenderness.  Throat: lips, mucosa, and tongue normal; teeth and gums normal  Neck: no adenopathy, no carotid bruit, no JVD, supple, symmetrical, trachea midline and thyroid not enlarged, symmetric, no tenderness/mass/nodules  No CVA tenderness.  Lungs:  clear to auscultation bilaterally  Breasts: normal appearance, no masses or tenderness Heart: regular rate and rhythm, S1, S2 normal, no murmur, click, rub or gallop  Abdomen: soft, non-tender; bowel sounds normal; no masses, no organomegaly  Musculoskeletal: ROM normal in all joints, no crepitus, no deformity, Normal muscle strengthen. Back  is symmetric, no curvature. Skin: Skin color, texture, turgor normal. No rashes or lesions  Lymph nodes: Cervical, supraclavicular, and axillary nodes normal.  Neurologic: CN 2 -12 Normal, Normal symmetric reflexes. Normal coordination and gait  Psych: Alert & Oriented x 3, Mood appear stable.    Assessment:    Annual wellness medicare exam   Plan:    During the course of the visit the patient was educated and counseled about appropriate screening and preventive services including:    Annual flu vaccine recommended  Discussed COVID-19 protections such as mass coronary and social distancing    Patient Instructions (the written plan) was given to the patient.  Medicare Attestation  I have personally reviewed:  The patient's medical and social history  Their use of alcohol, tobacco or illicit drugs  Their current medications and supplements  The patient's functional ability including ADLs,fall risks, home safety risks, cognitive, and hearing and visual impairment  Diet and physical activities  Evidence for depression or mood disorders  The patient's weight, height, BMI, and visual acuity have been recorded in the chart. I have made referrals, counseling, and provided education to the patient based on review of the above and I have provided the patient with a written personalized care plan for preventive services.

## 2019-12-11 ENCOUNTER — Other Ambulatory Visit: Payer: Self-pay | Admitting: Internal Medicine

## 2020-03-12 ENCOUNTER — Inpatient Hospital Stay (HOSPITAL_COMMUNITY): Payer: Medicare Other

## 2020-03-12 ENCOUNTER — Other Ambulatory Visit: Payer: Self-pay

## 2020-03-12 ENCOUNTER — Encounter (HOSPITAL_COMMUNITY): Payer: Self-pay | Admitting: Emergency Medicine

## 2020-03-12 ENCOUNTER — Inpatient Hospital Stay (HOSPITAL_COMMUNITY): Payer: Medicare Other | Admitting: Anesthesiology

## 2020-03-12 ENCOUNTER — Encounter (HOSPITAL_COMMUNITY)
Admission: EM | Disposition: A | Discharge status: Skilled Nursing Facility | Payer: Self-pay | Source: Home / Self Care | Attending: Internal Medicine

## 2020-03-12 ENCOUNTER — Inpatient Hospital Stay (HOSPITAL_COMMUNITY)
Admission: EM | Admit: 2020-03-12 | Discharge: 2020-03-16 | DRG: 480 | Disposition: A | Discharge status: Skilled Nursing Facility | Payer: Medicare Other | Attending: Internal Medicine | Admitting: Internal Medicine

## 2020-03-12 ENCOUNTER — Emergency Department (HOSPITAL_COMMUNITY): Payer: Medicare Other

## 2020-03-12 DIAGNOSIS — R7303 Prediabetes: Secondary | ICD-10-CM | POA: Diagnosis present

## 2020-03-12 DIAGNOSIS — S72002A Fracture of unspecified part of neck of left femur, initial encounter for closed fracture: Secondary | ICD-10-CM

## 2020-03-12 DIAGNOSIS — S72142A Displaced intertrochanteric fracture of left femur, initial encounter for closed fracture: Secondary | ICD-10-CM | POA: Diagnosis present

## 2020-03-12 DIAGNOSIS — Z20822 Contact with and (suspected) exposure to covid-19: Secondary | ICD-10-CM | POA: Diagnosis not present

## 2020-03-12 DIAGNOSIS — S42222A 2-part displaced fracture of surgical neck of left humerus, initial encounter for closed fracture: Principal | ICD-10-CM | POA: Diagnosis present

## 2020-03-12 DIAGNOSIS — D62 Acute posthemorrhagic anemia: Secondary | ICD-10-CM | POA: Diagnosis not present

## 2020-03-12 DIAGNOSIS — S42292D Other displaced fracture of upper end of left humerus, subsequent encounter for fracture with routine healing: Secondary | ICD-10-CM | POA: Diagnosis not present

## 2020-03-12 DIAGNOSIS — Z880 Allergy status to penicillin: Secondary | ICD-10-CM | POA: Diagnosis not present

## 2020-03-12 DIAGNOSIS — S199XXA Unspecified injury of neck, initial encounter: Secondary | ICD-10-CM | POA: Diagnosis not present

## 2020-03-12 DIAGNOSIS — S72002D Fracture of unspecified part of neck of left femur, subsequent encounter for closed fracture with routine healing: Secondary | ICD-10-CM | POA: Diagnosis not present

## 2020-03-12 DIAGNOSIS — S0990XA Unspecified injury of head, initial encounter: Secondary | ICD-10-CM | POA: Diagnosis not present

## 2020-03-12 DIAGNOSIS — Y92000 Kitchen of unspecified non-institutional (private) residence as  the place of occurrence of the external cause: Secondary | ICD-10-CM | POA: Diagnosis not present

## 2020-03-12 DIAGNOSIS — T148XXA Other injury of unspecified body region, initial encounter: Secondary | ICD-10-CM

## 2020-03-12 DIAGNOSIS — Z743 Need for continuous supervision: Secondary | ICD-10-CM | POA: Diagnosis not present

## 2020-03-12 DIAGNOSIS — I350 Nonrheumatic aortic (valve) stenosis: Secondary | ICD-10-CM | POA: Diagnosis not present

## 2020-03-12 DIAGNOSIS — S2242XA Multiple fractures of ribs, left side, initial encounter for closed fracture: Secondary | ICD-10-CM | POA: Diagnosis not present

## 2020-03-12 DIAGNOSIS — I10 Essential (primary) hypertension: Secondary | ICD-10-CM | POA: Diagnosis present

## 2020-03-12 DIAGNOSIS — S72142D Displaced intertrochanteric fracture of left femur, subsequent encounter for closed fracture with routine healing: Secondary | ICD-10-CM | POA: Diagnosis not present

## 2020-03-12 DIAGNOSIS — Z7401 Bed confinement status: Secondary | ICD-10-CM | POA: Diagnosis not present

## 2020-03-12 DIAGNOSIS — R9431 Abnormal electrocardiogram [ECG] [EKG]: Secondary | ICD-10-CM | POA: Diagnosis present

## 2020-03-12 DIAGNOSIS — R5381 Other malaise: Secondary | ICD-10-CM | POA: Diagnosis not present

## 2020-03-12 DIAGNOSIS — E785 Hyperlipidemia, unspecified: Secondary | ICD-10-CM | POA: Diagnosis present

## 2020-03-12 DIAGNOSIS — F0390 Unspecified dementia without behavioral disturbance: Secondary | ICD-10-CM | POA: Diagnosis present

## 2020-03-12 DIAGNOSIS — S42292A Other displaced fracture of upper end of left humerus, initial encounter for closed fracture: Secondary | ICD-10-CM

## 2020-03-12 DIAGNOSIS — S42202A Unspecified fracture of upper end of left humerus, initial encounter for closed fracture: Secondary | ICD-10-CM | POA: Diagnosis not present

## 2020-03-12 DIAGNOSIS — Z90710 Acquired absence of both cervix and uterus: Secondary | ICD-10-CM | POA: Diagnosis not present

## 2020-03-12 DIAGNOSIS — Z8249 Family history of ischemic heart disease and other diseases of the circulatory system: Secondary | ICD-10-CM

## 2020-03-12 DIAGNOSIS — S42202D Unspecified fracture of upper end of left humerus, subsequent encounter for fracture with routine healing: Secondary | ICD-10-CM | POA: Diagnosis not present

## 2020-03-12 DIAGNOSIS — Z87891 Personal history of nicotine dependence: Secondary | ICD-10-CM

## 2020-03-12 DIAGNOSIS — R52 Pain, unspecified: Secondary | ICD-10-CM | POA: Diagnosis not present

## 2020-03-12 DIAGNOSIS — R0689 Other abnormalities of breathing: Secondary | ICD-10-CM | POA: Diagnosis not present

## 2020-03-12 DIAGNOSIS — W19XXXA Unspecified fall, initial encounter: Secondary | ICD-10-CM | POA: Diagnosis not present

## 2020-03-12 DIAGNOSIS — Z79899 Other long term (current) drug therapy: Secondary | ICD-10-CM

## 2020-03-12 DIAGNOSIS — Z4789 Encounter for other orthopedic aftercare: Secondary | ICD-10-CM | POA: Diagnosis not present

## 2020-03-12 DIAGNOSIS — W19XXXD Unspecified fall, subsequent encounter: Secondary | ICD-10-CM | POA: Diagnosis not present

## 2020-03-12 DIAGNOSIS — H9190 Unspecified hearing loss, unspecified ear: Secondary | ICD-10-CM | POA: Diagnosis present

## 2020-03-12 DIAGNOSIS — T148XXD Other injury of unspecified body region, subsequent encounter: Secondary | ICD-10-CM | POA: Diagnosis not present

## 2020-03-12 DIAGNOSIS — Z03818 Encounter for observation for suspected exposure to other biological agents ruled out: Secondary | ICD-10-CM | POA: Diagnosis not present

## 2020-03-12 DIAGNOSIS — W1830XA Fall on same level, unspecified, initial encounter: Secondary | ICD-10-CM | POA: Diagnosis present

## 2020-03-12 DIAGNOSIS — M2550 Pain in unspecified joint: Secondary | ICD-10-CM | POA: Diagnosis not present

## 2020-03-12 HISTORY — PX: ORIF HUMERUS FRACTURE: SHX2126

## 2020-03-12 HISTORY — PX: INTRAMEDULLARY (IM) NAIL INTERTROCHANTERIC: SHX5875

## 2020-03-12 LAB — CBC WITH DIFFERENTIAL/PLATELET
Abs Immature Granulocytes: 0.05 10*3/uL (ref 0.00–0.07)
Basophils Absolute: 0 10*3/uL (ref 0.0–0.1)
Basophils Relative: 0 %
Eosinophils Absolute: 0.2 10*3/uL (ref 0.0–0.5)
Eosinophils Relative: 2 %
HCT: 34.9 % — ABNORMAL LOW (ref 36.0–46.0)
Hemoglobin: 11.4 g/dL — ABNORMAL LOW (ref 12.0–15.0)
Immature Granulocytes: 1 %
Lymphocytes Relative: 14 %
Lymphs Abs: 1.4 10*3/uL (ref 0.7–4.0)
MCH: 30.4 pg (ref 26.0–34.0)
MCHC: 32.7 g/dL (ref 30.0–36.0)
MCV: 93.1 fL (ref 80.0–100.0)
Monocytes Absolute: 0.7 10*3/uL (ref 0.1–1.0)
Monocytes Relative: 7 %
Neutro Abs: 7.4 10*3/uL (ref 1.7–7.7)
Neutrophils Relative %: 76 %
Platelets: 258 10*3/uL (ref 150–400)
RBC: 3.75 MIL/uL — ABNORMAL LOW (ref 3.87–5.11)
RDW: 13.2 % (ref 11.5–15.5)
WBC: 9.9 10*3/uL (ref 4.0–10.5)
nRBC: 0 % (ref 0.0–0.2)

## 2020-03-12 LAB — BASIC METABOLIC PANEL
Anion gap: 8 (ref 5–15)
BUN: 14 mg/dL (ref 8–23)
CO2: 24 mmol/L (ref 22–32)
Calcium: 9.2 mg/dL (ref 8.9–10.3)
Chloride: 102 mmol/L (ref 98–111)
Creatinine, Ser: 0.64 mg/dL (ref 0.44–1.00)
GFR calc Af Amer: >60 mL/min (ref >60)
GFR calc non Af Amer: >60 mL/min (ref >60)
Glucose, Bld: 185 mg/dL — ABNORMAL HIGH (ref 70–99)
Potassium: 3.9 mmol/L (ref 3.5–5.1)
Sodium: 134 mmol/L — ABNORMAL LOW (ref 135–145)

## 2020-03-12 LAB — RESPIRATORY PANEL BY RT PCR (FLU A&B, COVID)
Influenza A by PCR: NEGATIVE
Influenza B by PCR: NEGATIVE
SARS Coronavirus 2 by RT PCR: NEGATIVE

## 2020-03-12 LAB — ABO/RH: ABO/RH(D): A POS

## 2020-03-12 LAB — CBG MONITORING, ED: Glucose-Capillary: 165 mg/dL — ABNORMAL HIGH (ref 70–99)

## 2020-03-12 SURGERY — FIXATION, FRACTURE, INTERTROCHANTERIC, WITH INTRAMEDULLARY ROD
Anesthesia: General | Laterality: Left

## 2020-03-12 MED ORDER — SUCCINYLCHOLINE CHLORIDE 200 MG/10ML IV SOSY
PREFILLED_SYRINGE | INTRAVENOUS | Status: AC
  Filled 2020-03-12: qty 20

## 2020-03-12 MED ORDER — ARTIFICIAL TEARS OPHTHALMIC OINT
TOPICAL_OINTMENT | OPHTHALMIC | Status: DC | PRN
  Administered 2020-03-12: 1 via OPHTHALMIC

## 2020-03-12 MED ORDER — SUGAMMADEX SODIUM 200 MG/2ML IV SOLN
INTRAVENOUS | Status: DC | PRN
  Administered 2020-03-12: 200 mg via INTRAVENOUS

## 2020-03-12 MED ORDER — FLUTICASONE PROPIONATE 50 MCG/ACT NA SUSP
1.0000 | Freq: Every day | NASAL | Status: DC | PRN
  Filled 2020-03-12: qty 16

## 2020-03-12 MED ORDER — ONDANSETRON HCL 4 MG/2ML IJ SOLN: 4.0000 mg | Freq: Once | INTRAMUSCULAR | Status: DC | PRN

## 2020-03-12 MED ORDER — PROPOFOL 10 MG/ML IV BOLUS
INTRAVENOUS | Status: AC
  Filled 2020-03-12: qty 20

## 2020-03-12 MED ORDER — LACTATED RINGERS IV SOLN: INTRAVENOUS | Status: DC | PRN

## 2020-03-12 MED ORDER — ONDANSETRON HCL 4 MG/2ML IJ SOLN
INTRAMUSCULAR | Status: DC | PRN
  Administered 2020-03-12: 4 mg via INTRAVENOUS

## 2020-03-12 MED ORDER — EPHEDRINE 5 MG/ML INJ
INTRAVENOUS | Status: AC
  Filled 2020-03-12: qty 10

## 2020-03-12 MED ORDER — ACETAMINOPHEN 325 MG PO TABS
325.0000 mg | ORAL_TABLET | Freq: Four times a day (QID) | ORAL | Status: DC | PRN
  Administered 2020-03-14 – 2020-03-15 (×3): 650 mg via ORAL
  Filled 2020-03-12 (×4): qty 2

## 2020-03-12 MED ORDER — ROCURONIUM BROMIDE 50 MG/5ML IV SOSY
PREFILLED_SYRINGE | INTRAVENOUS | Status: DC | PRN
  Administered 2020-03-12: 40 mg via INTRAVENOUS
  Administered 2020-03-12: 10 mg via INTRAVENOUS

## 2020-03-12 MED ORDER — VASOPRESSIN 20 UNIT/ML IV SOLN
INTRAVENOUS | Status: AC
  Filled 2020-03-12: qty 1

## 2020-03-12 MED ORDER — SUMATRIPTAN SUCCINATE 25 MG PO TABS
25.0000 mg | ORAL_TABLET | ORAL | Status: DC | PRN
  Filled 2020-03-12: qty 1

## 2020-03-12 MED ORDER — SODIUM CHLORIDE (PF) 0.9 % IJ SOLN
INTRAMUSCULAR | Status: AC
  Filled 2020-03-12: qty 20

## 2020-03-12 MED ORDER — SALINE SPRAY 0.65 % NA SOLN
1.0000 | NASAL | Status: DC | PRN
  Filled 2020-03-12: qty 44

## 2020-03-12 MED ORDER — HYDROMORPHONE HCL 1 MG/ML IJ SOLN
0.5000 mg | Freq: Once | INTRAMUSCULAR | Status: AC
  Administered 2020-03-12: 14:00:00 0.5 mg via INTRAVENOUS
  Filled 2020-03-12: qty 1

## 2020-03-12 MED ORDER — OXYCODONE HCL 5 MG PO TABS: 5.0000 mg | ORAL_TABLET | Freq: Once | ORAL | Status: DC | PRN

## 2020-03-12 MED ORDER — HYDROMORPHONE HCL 1 MG/ML IJ SOLN: 0.5000 mg | INTRAMUSCULAR | Status: DC | PRN

## 2020-03-12 MED ORDER — CEFAZOLIN SODIUM-DEXTROSE 2-4 GM/100ML-% IV SOLN
2.0000 g | Freq: Three times a day (TID) | INTRAVENOUS | Status: AC
  Administered 2020-03-13 (×3): 2 g via INTRAVENOUS
  Filled 2020-03-12 (×3): qty 100

## 2020-03-12 MED ORDER — ENOXAPARIN SODIUM 40 MG/0.4ML ~~LOC~~ SOLN: 40.0000 mg | SUBCUTANEOUS | Status: DC

## 2020-03-12 MED ORDER — LIDOCAINE 2% (20 MG/ML) 5 ML SYRINGE
INTRAMUSCULAR | Status: AC
  Filled 2020-03-12: qty 10

## 2020-03-12 MED ORDER — PHENYLEPHRINE HCL-NACL 10-0.9 MG/250ML-% IV SOLN
INTRAVENOUS | Status: DC | PRN
Start: 2020-03-12 — End: 2020-03-12
  Administered 2020-03-12: 25 ug/min via INTRAVENOUS

## 2020-03-12 MED ORDER — FENTANYL CITRATE (PF) 100 MCG/2ML IJ SOLN
INTRAMUSCULAR | Status: DC | PRN
  Administered 2020-03-12: 50 ug via INTRAVENOUS
  Administered 2020-03-12 (×2): 25 ug via INTRAVENOUS

## 2020-03-12 MED ORDER — ROCURONIUM BROMIDE 10 MG/ML (PF) SYRINGE
PREFILLED_SYRINGE | INTRAVENOUS | Status: AC
  Filled 2020-03-12: qty 20

## 2020-03-12 MED ORDER — LIDOCAINE 2% (20 MG/ML) 5 ML SYRINGE
INTRAMUSCULAR | Status: DC | PRN
  Administered 2020-03-12: 50 mg via INTRAVENOUS

## 2020-03-12 MED ORDER — CEFAZOLIN SODIUM 1 G IJ SOLR
INTRAMUSCULAR | Status: AC
  Filled 2020-03-12: qty 20

## 2020-03-12 MED ORDER — ONDANSETRON HCL 4 MG/2ML IJ SOLN
INTRAMUSCULAR | Status: AC
  Filled 2020-03-12: qty 2

## 2020-03-12 MED ORDER — ROSUVASTATIN CALCIUM 5 MG PO TABS
5.0000 mg | ORAL_TABLET | Freq: Every day | ORAL | Status: DC
  Administered 2020-03-13 – 2020-03-16 (×4): 5 mg via ORAL
  Filled 2020-03-12 (×4): qty 1

## 2020-03-12 MED ORDER — PHENYLEPHRINE 40 MCG/ML (10ML) SYRINGE FOR IV PUSH (FOR BLOOD PRESSURE SUPPORT)
PREFILLED_SYRINGE | INTRAVENOUS | Status: AC
  Filled 2020-03-12: qty 20

## 2020-03-12 MED ORDER — PROPOFOL 10 MG/ML IV BOLUS
INTRAVENOUS | Status: DC | PRN
  Administered 2020-03-12: 30 mg via INTRAVENOUS
  Administered 2020-03-12: 40 mg via INTRAVENOUS

## 2020-03-12 MED ORDER — METHOCARBAMOL 500 MG PO TABS: 500.0000 mg | ORAL_TABLET | Freq: Four times a day (QID) | ORAL | Status: DC | PRN

## 2020-03-12 MED ORDER — AMLODIPINE BESYLATE 5 MG PO TABS
5.0000 mg | ORAL_TABLET | Freq: Every day | ORAL | Status: DC
  Administered 2020-03-13 – 2020-03-16 (×4): 5 mg via ORAL
  Filled 2020-03-12 (×4): qty 1

## 2020-03-12 MED ORDER — VANCOMYCIN HCL 1000 MG IV SOLR
INTRAVENOUS | Status: DC | PRN
  Administered 2020-03-12 (×2): 1000 mg via TOPICAL

## 2020-03-12 MED ORDER — HYDROMORPHONE HCL 1 MG/ML IJ SOLN
0.5000 mg | INTRAMUSCULAR | Status: DC | PRN
  Administered 2020-03-12: 22:00:00 0.5 mg via INTRAVENOUS
  Filled 2020-03-12: qty 1

## 2020-03-12 MED ORDER — FENTANYL CITRATE (PF) 100 MCG/2ML IJ SOLN
50.0000 ug | INTRAMUSCULAR | Status: AC | PRN
  Administered 2020-03-12 (×2): 50 ug via INTRAVENOUS
  Filled 2020-03-12 (×2): qty 2

## 2020-03-12 MED ORDER — FENTANYL CITRATE (PF) 250 MCG/5ML IJ SOLN
INTRAMUSCULAR | Status: AC
  Filled 2020-03-12: qty 5

## 2020-03-12 MED ORDER — VANCOMYCIN HCL 1000 MG IV SOLR
INTRAVENOUS | Status: AC
  Filled 2020-03-12: qty 1000

## 2020-03-12 MED ORDER — FENTANYL CITRATE (PF) 100 MCG/2ML IJ SOLN: 25.0000 ug | INTRAMUSCULAR | Status: DC | PRN

## 2020-03-12 MED ORDER — PHENYLEPHRINE HCL (PRESSORS) 10 MG/ML IV SOLN
INTRAVENOUS | Status: DC | PRN
Start: 2020-03-12 — End: 2020-03-12
  Administered 2020-03-12 (×3): 80 ug via INTRAVENOUS

## 2020-03-12 MED ORDER — 0.9 % SODIUM CHLORIDE (POUR BTL) OPTIME
TOPICAL | Status: DC | PRN
  Administered 2020-03-12: 1000 mL

## 2020-03-12 MED ORDER — VASOPRESSIN 20 UNIT/ML IV SOLN
INTRAVENOUS | Status: DC | PRN
  Administered 2020-03-12 (×3): 1 [IU] via INTRAVENOUS

## 2020-03-12 MED ORDER — CEFAZOLIN SODIUM-DEXTROSE 2-3 GM-%(50ML) IV SOLR
INTRAVENOUS | Status: DC | PRN
  Administered 2020-03-12: 2 g via INTRAVENOUS

## 2020-03-12 MED ORDER — SODIUM CHLORIDE 0.9 % IV SOLN: INTRAVENOUS | Status: AC

## 2020-03-12 MED ORDER — ENOXAPARIN SODIUM 40 MG/0.4ML ~~LOC~~ SOLN
40.0000 mg | SUBCUTANEOUS | Status: DC
  Filled 2020-03-12: qty 0.4

## 2020-03-12 MED ORDER — HYDROCODONE-ACETAMINOPHEN 5-325 MG PO TABS
1.0000 | ORAL_TABLET | Freq: Four times a day (QID) | ORAL | Status: DC | PRN
  Administered 2020-03-13: 01:00:00 2 via ORAL
  Administered 2020-03-13 – 2020-03-16 (×2): 1 via ORAL
  Filled 2020-03-12: qty 2
  Filled 2020-03-12 (×2): qty 1

## 2020-03-12 MED ORDER — OXYCODONE HCL 5 MG/5ML PO SOLN: 5.0000 mg | Freq: Once | ORAL | Status: DC | PRN

## 2020-03-12 MED ORDER — ARTIFICIAL TEARS OPHTHALMIC OINT
TOPICAL_OINTMENT | OPHTHALMIC | Status: AC
  Filled 2020-03-12: qty 3.5

## 2020-03-12 SURGICAL SUPPLY — 82 items
BIT DRILL 2.5MM SMALL QC EVOS (BIT) ×1 IMPLANT
BIT DRILL LONG 4.0 (BIT) ×1 IMPLANT
BIT DRILL QC 2.5MM SHRT EVO SM (DRILL) ×1 IMPLANT
BNDG COHESIVE 4X5 TAN STRL (GAUZE/BANDAGES/DRESSINGS) ×3 IMPLANT
BNDG ELASTIC 6X5.8 VLCR STR LF (GAUZE/BANDAGES/DRESSINGS) ×3 IMPLANT
BRUSH SCRUB EZ PLAIN DRY (MISCELLANEOUS) ×6 IMPLANT
CHLORAPREP W/TINT 26 (MISCELLANEOUS) ×3 IMPLANT
COUNTER NEEDLE 20 DBL MAG RED (NEEDLE) ×3 IMPLANT
COVER PERINEAL POST (MISCELLANEOUS) ×3 IMPLANT
COVER SURGICAL LIGHT HANDLE (MISCELLANEOUS) ×6 IMPLANT
COVER WAND RF STERILE (DRAPES) ×3 IMPLANT
DERMABOND ADVANCED (GAUZE/BANDAGES/DRESSINGS) ×4
DERMABOND ADVANCED .7 DNX12 (GAUZE/BANDAGES/DRESSINGS) ×2 IMPLANT
DRAPE C-ARM 35X43 STRL (DRAPES) ×3 IMPLANT
DRAPE C-ARM 42X72 X-RAY (DRAPES) ×3 IMPLANT
DRAPE HALF SHEET 40X57 (DRAPES) ×3 IMPLANT
DRAPE IMP U-DRAPE 54X76 (DRAPES) ×6 IMPLANT
DRAPE INCISE IOBAN 66X45 STRL (DRAPES) ×3 IMPLANT
DRAPE STERI IOBAN 125X83 (DRAPES) ×3 IMPLANT
DRAPE SURG 17X23 STRL (DRAPES) ×6 IMPLANT
DRAPE U-SHAPE 47X51 STRL (DRAPES) ×6 IMPLANT
DRILL 2.5MM SMALL QC EVOS (BIT) ×3
DRILL BIT LONG 4.0 (BIT) ×3
DRILL QC 2.5MM SHORT EVOS SM (DRILL) ×3
DRSG MEPILEX BORDER 4X4 (GAUZE/BANDAGES/DRESSINGS) ×3 IMPLANT
DRSG MEPILEX BORDER 4X8 (GAUZE/BANDAGES/DRESSINGS) ×6 IMPLANT
ELECT REM PT RETURN 9FT ADLT (ELECTROSURGICAL) ×3
ELECTRODE REM PT RTRN 9FT ADLT (ELECTROSURGICAL) ×1 IMPLANT
GLOVE BIO SURGEON STRL SZ 6.5 (GLOVE) ×6 IMPLANT
GLOVE BIO SURGEON STRL SZ7.5 (GLOVE) ×12 IMPLANT
GLOVE BIO SURGEONS STRL SZ 6.5 (GLOVE) ×3
GLOVE BIOGEL PI IND STRL 6.5 (GLOVE) ×1 IMPLANT
GLOVE BIOGEL PI IND STRL 7.5 (GLOVE) ×1 IMPLANT
GLOVE BIOGEL PI INDICATOR 6.5 (GLOVE) ×2
GLOVE BIOGEL PI INDICATOR 7.5 (GLOVE) ×2
GOWN STRL REUS W/ TWL LRG LVL3 (GOWN DISPOSABLE) ×2 IMPLANT
GOWN STRL REUS W/TWL LRG LVL3 (GOWN DISPOSABLE) ×6
GUIDE PIN 3.2X343 (PIN) ×2
GUIDE PIN 3.2X343MM (PIN) ×6
K-WIRE 1.25 (WIRE) ×3
K-WIRE 1.6 (WIRE) ×3
K-WIRE FX150X1.6XTROC PNT (WIRE) ×1
KIT BASIN OR (CUSTOM PROCEDURE TRAY) ×3 IMPLANT
KIT TURNOVER KIT B (KITS) ×3 IMPLANT
KWIRE FX150X1.6XTROC PNT (WIRE) ×1 IMPLANT
MANIFOLD NEPTUNE II (INSTRUMENTS) ×3 IMPLANT
NAIL INTERTAN 10X18 130D 10S (Nail) ×3 IMPLANT
NDL SUT 6 .5 CRC .975X.05 MAYO (NEEDLE) ×1 IMPLANT
NEEDLE MAYO TAPER (NEEDLE) ×3
NS IRRIG 1000ML POUR BTL (IV SOLUTION) ×3 IMPLANT
PACK GENERAL/GYN (CUSTOM PROCEDURE TRAY) ×3 IMPLANT
PACK SHOULDER (CUSTOM PROCEDURE TRAY) ×3 IMPLANT
PAD ARMBOARD 7.5X6 YLW CONV (MISCELLANEOUS) ×6 IMPLANT
PIN GUIDE 3.2X343MM (PIN) ×2 IMPLANT
PLATE PROX HUM EVOS 3.5X92 3H (Plate) ×3 IMPLANT
SCREW CORTEX 3.5X24MM (Screw) ×6 IMPLANT
SCREW LAG COMPR KIT 95/90 (Screw) ×3 IMPLANT
SCREW LOCK 3.5X44MM (Screw) ×12 IMPLANT
SCREW LOCK ST EVOS 3.5X22 (Screw) ×3 IMPLANT
SCREW LOCK ST EVOS 3.5X38 (Screw) ×3 IMPLANT
SCREW LOCK ST EVOS 3.5X40 (Screw) ×6 IMPLANT
SCREW LOCK ST EVOS 3.5X42 (Screw) ×3 IMPLANT
SCREW TRIGEN LOW PROF 5.0X30 (Screw) ×3 IMPLANT
SLING ARM IMMOBILIZER MED (SOFTGOODS) ×3 IMPLANT
SPONGE LAP 18X18 RF (DISPOSABLE) ×3 IMPLANT
SPONGE LAP 18X18 X RAY DECT (DISPOSABLE) ×3 IMPLANT
STAPLER VISISTAT 35W (STAPLE) ×3 IMPLANT
SUCTION FRAZIER HANDLE 10FR (MISCELLANEOUS) ×3
SUCTION TUBE FRAZIER 10FR DISP (MISCELLANEOUS) ×1 IMPLANT
SUT ETHILON 3 0 FSL (SUTURE) IMPLANT
SUT FIBERWIRE #2 38 T-5 BLUE (SUTURE) ×6
SUT MNCRL AB 3-0 PS2 18 (SUTURE) ×9 IMPLANT
SUT VIC AB 0 CT1 27 (SUTURE) ×6
SUT VIC AB 0 CT1 27XBRD ANBCTR (SUTURE) ×2 IMPLANT
SUT VIC AB 2-0 CT1 27 (SUTURE) ×12
SUT VIC AB 2-0 CT1 TAPERPNT 27 (SUTURE) ×4 IMPLANT
SUTURE FIBERWR #2 38 T-5 BLUE (SUTURE) ×2 IMPLANT
TOWEL GREEN STERILE (TOWEL DISPOSABLE) ×6 IMPLANT
TRAY FOLEY MTR SLVR 14FR STAT (SET/KITS/TRAYS/PACK) ×3 IMPLANT
TRAY FOLEY MTR SLVR 16FR STAT (SET/KITS/TRAYS/PACK) IMPLANT
WATER STERILE IRR 1000ML POUR (IV SOLUTION) ×3 IMPLANT
WIRE FX150X1.25XTROC PNT (WIRE) ×1 IMPLANT

## 2020-03-12 NOTE — Transfer of Care (Signed)
Immediate Anesthesia Transfer of Care Note  Patient: Anna Ewing  Procedure(s) Performed: INTRAMEDULLARY (IM) NAIL INTERTROCHANTRIC (Left ) OPEN REDUCTION INTERNAL FIXATION (ORIF) PROXIMAL HUMERUS FRACTURE (Left )  Patient Location: PACU  Anesthesia Type:General  Level of Consciousness: awake  Airway & Oxygen Therapy: Patient Spontanous Breathing and Patient connected to face mask oxygen  Post-op Assessment: Report given to RN and Post -op Vital signs reviewed and stable  Post vital signs: Reviewed and stable  Last Vitals:  Vitals Value Taken Time  BP 149/118 03/12/20 1955  Temp 36.7 C 03/12/20 1955  Pulse 91 03/12/20 1955  Resp 21 03/12/20 1955  SpO2 100 % 03/12/20 1955  Vitals shown include unvalidated device data.  Last Pain:  Vitals:   03/12/20 1450  TempSrc:   PainSc: 7          Complications: No apparent anesthesia complications

## 2020-03-12 NOTE — H&P (Signed)
History and Physical    AVIANCE COOPERWOOD UKG:[id number] DOB: 1929/10/19 DOA: 03/12/2020  PCP: Margaree Mackintosh, MD   Patient coming from: Home  I have personally briefly reviewed patient's old medical records in [facility]  Chief Complaint: I fell  HPI: Anna Ewing is a 86 y.o. female with medical history significant of HTN, HLD, presented with mechanical fall this morning. Patient was standing in the kitchen when she turned and then fell to the ground.  She landed on her left hip and left shoulder.  She is complaining of 10 out of 10 pain in those areas.  She denies hitting her head and there is no loss of consciousness.  She denied any light headedness, palpitation, blurry vision, any limb weakness or numbness before or after she fell. ED Course: Xray shows Comminuted intertrochanteric LEFT femur fracture with varus Angulation; Displaced and impacted humeral head and neck fractures  Review of Systems: As per HPI otherwise 10 point review of systems negative.    Past Medical History:  Diagnosis Date  . Chronic rhinitis   . Hyperlipidemia   . Hypertension     Past Surgical History:  Procedure Laterality Date  . MANDIBLE SURGERY     cosmetic  . NASAL SINUS SURGERY    . VESICOVAGINAL FISTULA CLOSURE W/ TAH  2005     reports that she quit smoking about 45 years ago. Her smoking use included cigarettes. She has a 5.00 pack-year smoking history. She has never used smokeless tobacco. She reports that she does not drink alcohol or use drugs.  Allergies  Allergen Reactions  . Amoxicillin Rash    Family History  Problem Relation Age of Onset  . Cancer Mother   . Heart disease Mother   . Heart disease Father      Prior to Admission medications   Medication Sig Start Date End Date Taking? Authorizing Provider  amLODipine (NORVASC) 5 MG tablet TAKE 1 TABLET BY MOUTH EVERY DAY Patient taking differently: Take 5 mg by mouth daily.  12/11/19  Yes Baxley, [name], MD    loratadine-pseudoephedrine (CLARITIN-D 12-HOUR) 5-120 MG per tablet Take 1 tablet by mouth as needed.    Yes [provider]  rosuvastatin (CRESTOR) 5 MG tablet Take 1 tablet (5 mg total) by mouth daily. 05/13/19  Yes BaxleyLuanna Cole, MD  ergocalciferol (DRISDOL) 50000 units capsule One po weekly for Vitamin D deficiency Patient not taking: Reported on 03/12/2020 08/25/18   Margaree Mackintosh, MD  fluticasone [facility]) 50 MCG/ACT nasal spray 1 or 2 sprays each nostril, once daily at bedtime Patient not taking: Reported on 03/12/2020 07/01/11   Waymon Budge, MD  SUMAtriptan (IMITREX) 100 MG tablet One po at onset of migraine and may repeat once in 2 hours Patient not taking: Reported on 03/12/2020 10/02/15   Margaree Mackintosh, MD    Physical Exam: Vitals:   03/12/20 1400 03/12/20 1403 03/12/20 1415 03/12/20 1430  BP:  (!) 143/64 (!) 141/64 121/72  Pulse: 90 88 87 97  Resp: 15 (!) 21 (!) 21 13  Temp:      TempSrc:      SpO2: 100% 100% 96% 97%  Weight:      Height:        Constitutional: NAD, calm, comfortable Vitals:   03/12/20 1400 03/12/20 1403 03/12/20 1415 03/12/20 1430  BP:  (!) 143/64 (!) 141/64 121/72  Pulse: 90 88 87 97  Resp: 15 (!) 21 (!) 21 13  Temp:      TempSrc:      SpO2: 100% 100% 96% 97%  Weight:      Height:       Eyes: PERRL, lids and conjunctivae normal ENMT: Mucous membranes are moist. Posterior pharynx clear of any exudate or lesions.Normal dentition.  Neck: normal, supple, no masses, no thyromegaly Respiratory: clear to auscultation bilaterally, no wheezing, no crackles. Normal respiratory effort. No accessory muscle use.  Cardiovascular: Regular rate and rhythm, no murmurs / rubs / gallops. No extremity edema. 2+ pedal pulses. No carotid bruits.  Abdomen: no tenderness, no masses palpated. No hepatosplenomegaly. Bowel sounds positive.  Musculoskeletal: no clubbing / cyanosis. No joint deformity upper and lower extremities. Good ROM, no contractures.  Normal muscle tone. Left leg shortened and rotated Skin: no rashes, lesions, ulcers. No induration Neurologic: CN 2-12 grossly intact. Sensation intact, DTR normal. Strength 5/5 in all 4.  Psychiatric: Normal judgment and insight. Alert and oriented x 3. Normal mood.     Labs on Admission: I have personally reviewed following labs and imaging studies  CBC: Recent Labs  Lab 03/12/20 1210  WBC 9.9  NEUTROABS 7.4  HGB 11.4*  HCT 34.9*  MCV 93.1  PLT 258   Basic Metabolic Panel: Recent Labs  Lab 03/12/20 1210  NA 134*  K 3.9  CL 102  CO2 24  GLUCOSE 185*  BUN 14  CREATININE 0.64  CALCIUM 9.2   GFR: Estimated Creatinine Clearance: 37.6 mL/min (by C-G formula based on SCr of 0.64 mg/dL). Liver Function Tests: No results for input(s): AST, ALT, ALKPHOS, BILITOT, PROT, ALBUMIN in the last 168 hours. No results for input(s): LIPASE, AMYLASE in the last 168 hours. No results for input(s): AMMONIA in the last 168 hours. Coagulation Profile: No results for input(s): INR, PROTIME in the last 168 hours. Cardiac Enzymes: No results for input(s): CKTOTAL, CKMB, CKMBINDEX, TROPONINI in the last 168 hours. BNP (last 3 results) No results for input(s): PROBNP in the last 8760 hours. HbA1C: No results for input(s): HGBA1C in the last 72 hours. CBG: Recent Labs  Lab 03/12/20 1137  GLUCAP 165*   Lipid Profile: No results for input(s): CHOL, HDL, LDLCALC, TRIG, CHOLHDL, LDLDIRECT in the last 72 hours. Thyroid Function Tests: No results for input(s): TSH, T4TOTAL, FREET4, T3FREE, THYROIDAB in the last 72 hours. Anemia Panel: No results for input(s): VITAMINB12, FOLATE, FERRITIN, TIBC, IRON, RETICCTPCT in the last 72 hours. Urine analysis:    Component Value Date/Time   BILIRUBINUR NEG 05/31/2019 1131   PROTEINUR Negative 05/31/2019 1131   UROBILINOGEN 0.2 05/31/2019 1131   NITRITE NEG 05/31/2019 1131   LEUKOCYTESUR Negative 05/31/2019 1131    Radiological Exams on  Admission: DG Chest 1 View  Result Date: 03/12/2020 CLINICAL DATA:  Fall, shoulder pain EXAM: CHEST  1 VIEW COMPARISON:  CT chest of 07/02/2012 FINDINGS: Cardiomediastinal contours and hilar structures are normal. No consolidation. No pleural effusion. No pneumothorax. LEFT-sided rib fracture at the eighth rib with more chronic appearance, LEFT posterior ninth rib is age indeterminate. Displaced and angulated humeral neck fracture partially visualized. IMPRESSION: 1. No acute cardiopulmonary disease. Impacted angulated humeral neck fracture on the LEFT partially visualized. 2. LEFT-sided rib fractures, age indeterminate but with suggestion of chronic features. Correlate with any point tenderness in these areas. Electronically Signed   By: Donzetta Kohut M.D.   On: 03/12/2020 13:21   CT HEAD WO CONTRAST  Result Date: 03/12/2020 CLINICAL DATA:  [place]. Hit head. EXAM: CT HEAD WITHOUT CONTRAST  CT CERVICAL SPINE WITHOUT CONTRAST TECHNIQUE: Multidetector CT imaging of the head and cervical spine was performed following the standard protocol without intravenous contrast. Multiplanar CT image reconstructions of the cervical spine were also generated. COMPARISON:  MRI brain 07/16/2017 FINDINGS: CT HEAD FINDINGS Brain: Stable age related cerebral atrophy, ventriculomegaly and periventricular white matter disease. No extra-axial fluid collections are identified. No CT findings for acute hemispheric infarction or intracranial hemorrhage. No mass lesions. The brainstem and cerebellum are normal. Vascular: Vascular calcifications but no definite aneurysm or hyperdense vessels. Skull: No skull fracture or bone lesion. Sinuses/Orbits: The paranasal sinuses and mastoid air cells are clear. The globes are intact. Other: No scalp lesions or hematoma. CT CERVICAL SPINE FINDINGS Alignment: Normal overall alignment of the cervical vertebral bodies. Mild multilevel degenerative subluxations are noted due to advanced degenerative  disc disease and facet disease. Skull base and vertebrae: No acute fractures are identified. Advanced facet disease noted with fused facet joints on the right at C4-5. Soft tissues and spinal canal: No prevertebral fluid or swelling. No visible canal hematoma. Disc levels: The spinal canal is fairly generous. No large disc protrusions or significant spinal stenosis. Mild multilevel foraminal stenosis due to uncinate spurring and facet disease. Upper chest: The lung apices are grossly clear. Biapical pleural and parenchymal scarring changes with calcifications are noted. Other: Bilateral carotid artery calcifications are noted. IMPRESSION: 1. Stable age related cerebral atrophy, ventriculomegaly and periventricular white matter disease. 2. No acute intracranial findings or skull fracture. 3. Advanced degenerative cervical disc disease and facet disease but no acute cervical spine fracture. Electronically Signed   By: Rudie Meyer M.D.   On: 03/12/2020 13:28   CT CERVICAL SPINE WO CONTRAST  Result Date: 03/12/2020 CLINICAL DATA:  [place]. Hit head. EXAM: CT HEAD WITHOUT CONTRAST CT CERVICAL SPINE WITHOUT CONTRAST TECHNIQUE: Multidetector CT imaging of the head and cervical spine was performed following the standard protocol without intravenous contrast. Multiplanar CT image reconstructions of the cervical spine were also generated. COMPARISON:  MRI brain 07/16/2017 FINDINGS: CT HEAD FINDINGS Brain: Stable age related cerebral atrophy, ventriculomegaly and periventricular white matter disease. No extra-axial fluid collections are identified. No CT findings for acute hemispheric infarction or intracranial hemorrhage. No mass lesions. The brainstem and cerebellum are normal. Vascular: Vascular calcifications but no definite aneurysm or hyperdense vessels. Skull: No skull fracture or bone lesion. Sinuses/Orbits: The paranasal sinuses and mastoid air cells are clear. The globes are intact. Other: No scalp lesions or  hematoma. CT CERVICAL SPINE FINDINGS Alignment: Normal overall alignment of the cervical vertebral bodies. Mild multilevel degenerative subluxations are noted due to advanced degenerative disc disease and facet disease. Skull base and vertebrae: No acute fractures are identified. Advanced facet disease noted with fused facet joints on the right at C4-5. Soft tissues and spinal canal: No prevertebral fluid or swelling. No visible canal hematoma. Disc levels: The spinal canal is fairly generous. No large disc protrusions or significant spinal stenosis. Mild multilevel foraminal stenosis due to uncinate spurring and facet disease. Upper chest: The lung apices are grossly clear. Biapical pleural and parenchymal scarring changes with calcifications are noted. Other: Bilateral carotid artery calcifications are noted. IMPRESSION: 1. Stable age related cerebral atrophy, ventriculomegaly and periventricular white matter disease. 2. No acute intracranial findings or skull fracture. 3. Advanced degenerative cervical disc disease and facet disease but no acute cervical spine fracture. Electronically Signed   By: Rudie Meyer M.D.   On: 03/12/2020 13:28   DG Shoulder Left  Result Date:  03/12/2020 CLINICAL DATA:  [place]. Left shoulder pain. EXAM: LEFT SHOULDER - 2+ VIEW COMPARISON:  None. FINDINGS: There is a displaced fracture of the humeral head and neck. Transverse fracture through the humeral neck with moderate impaction and medial and posterior displacement of the humeral head. The visualized left lung is clear and the visualized left ribs are intact. Remote healed left rib fractures are noted. IMPRESSION: Displaced and impacted humeral head and neck fractures. Electronically Signed   By: Marijo Sanes M.D.   On: 03/12/2020 13:22   DG Hip Unilat With Pelvis 2-3 Views Left  Result Date: 03/12/2020 CLINICAL DATA:  86 year old female with acute LEFT hip pain following fall. Initial encounter. EXAM: DG HIP (WITH OR  WITHOUT PELVIS) 2-3V LEFT COMPARISON:  None. FINDINGS: A comminuted intertrochanteric fracture of the LEFT proximal femur is noted with varus angulation. No subluxation or dislocation. Diffuse osteopenia is noted. IMPRESSION: Comminuted intertrochanteric LEFT femur fracture with varus angulation. Electronically Signed   By: Margarette Canada M.D.   On: 03/12/2020 13:19    EKG: Independently reviewed. LVH with prolonged QTC  Assessment/Plan Active Problems:   Hip fracture, unspecified laterality, closed, initial encounter ([place])   Humerus head fracture, left, closed, initial encounter   Hip fracture requiring operative repair, left, closed, initial encounter ([place])  Left femoral neck fracture with varus angulation OR today Patient baseline active, able to tolerate 4 METS activity, no history of for stroke, heart attack, CHF, diabetes, cleared for surgery with general anesthesia with acceptable risks DVT prophylaxis PT evaluation  Left Humeral neck head fracture OR today  HTN Continue home meds  HLD Statin  Prolonged QTC Repeat EKG, avoid Tramadol    DVT prophylaxis: Lovenox Code Status: Full code Family Communication: Son over the phone  disposition Plan: Probable need rehab Consults called: Orthopedic surgeon Admission status: MedSurg admission   Lequita Halt MD [facility] Pager [phone]   03/12/2020, 3:57 PM

## 2020-03-12 NOTE — ED Notes (Signed)
Anna Ewing, son would like an update [phone]

## 2020-03-12 NOTE — Op Note (Signed)
Orthopaedic Surgery Operative Note (CSN: [id number] ) Date of Surgery: 03/12/2020  Admit Date: 03/12/2020   Diagnoses: Pre-Op Diagnoses: Left intertrochanteric femur fracture Left two part proximal humerus fracture  Post-Op Diagnosis: Same  Procedures: 1. CPT 27245-Cephalomedullary nailing of left intertrochanteric femur fracture 2. CPT 23615-Open reduction internal fixation of left proximal humerus  Surgeons : Primary: Roby Lofts, MD  Assistant: Ulyses Southward, PA-C  Location: OR 6   Anesthesia:General  Antibiotics: Ancef 2g preop with 500mg  vancomycin powder in hip incisions and 1 gm of vancomycin powder topically in left proximal humerus incision   Tourniquet time:None    Estimated Blood Loss:150 mL  Complications:None   Specimens:None   Implants: Implant Name Type Inv. Item Serial No. Manufacturer Lot No. LRB No. Used Action  NAIL INTERTAN 10X18 130D 10S - Nail NAIL INTERTAN 10X18 130D 10S  [employer] [id number] Left 1 Implanted  SCREW LAG COMBO 95.90 - [id number] Screw SCREW LAG COMBO 95.90  [employer] [id number] Left 1 Implanted  SCREW TRIGEN LOW PROF 5.0X30 - [id number] Screw SCREW TRIGEN LOW PROF 5.0X30  [employer] [id number] Left 1 Implanted  SCREW CORTEX 3.5X24MM - [id number] Screw SCREW CORTEX 3.5X24MM  [employer]  Left 1 Implanted  SCREW LOCK 3.5X44MM - [id number] Screw SCREW LOCK 3.5X44MM  [employer]  Left 3 Implanted  3H straight peo. humerus plate Plate  [id number]   Left 1 Implanted  3.5 x 40 lck screw  Screw  [id number]   Left 1 Implanted  3.5 x 42 lck screw  Screw  [id number]   Left 1 Implanted     Indications for Surgery: 86 year old female who sustained a ground-level fall with a left displaced intertrochanteric femur fracture as well as a left displaced 2 part proximal humerus fracture.  Due to the significant displacement and unstable nature of her hip fracture  I recommended cephalomedullary nailing.  Risks and benefits were discussed with the patient's son and her husband.  Risks include but not limited to bleeding, infection, malunion, nonunion, hardware failure, hardware irritation, nerve and blood vessel injury, DVT, even the possibility anesthetic complications.  I also felt that due to the displacement of her left proximal humerus and the need for mobilization and recovery that her open reduction internal fixation would be most appropriate.  I did discuss the high risk of cut out including the above noted surgical risks.  The son agreed to proceed with surgery of the proximal humerus as well.  Operative Findings: 1.  Cephalomedullary nailing of left intertrochanteric femur fracture using [employer] InterTAN 10 x 180 mm nail with 95/90 mm lag screw/compression screw combo 2.  Open reduction internal fixation of left 2 part proximal humerus fracture using [employer] EVOS proximal humeral locking plate  Procedure: The patient was identified in the preoperative holding area. Consent was confirmed with the patient and their family and all questions were answered. The operative extremity was marked after confirmation with the patient. she was then brought back to the operating room by our anesthesia colleagues.  She was placed under general anesthetic.  A Foley catheter was placed.  She was then carefully positioned on the Hana table.  All bony prominences were well-padded.  Her feet were positioned and secured.  Fluoroscopic imaging was then obtained to show the hip fracture.  Traction was applied and gentle internal rotation was applied as well.  Reduction was obtained.  The left lower extremity was then  prepped and draped in usual sterile fashion.  A timeout was performed to verify the patient, the procedure, and the extremity.  Preoperative antibiotics were dosed.  A percutaneous incision was made proximal to the greater trochanter.  The gluteus fascia  was split in line with the incision.  A threaded guidewire was then directed into the greater trochanter directed into the metaphysis both on the AP and lateral view.  I then used an entry reamer to enter the canal.  I then passed the 10 x 180 mm short nail down the center of the canal.  I aligned it appropriately on AP view.  A percutaneous incision was made along the lateral thigh.  A threaded guidewire was directed into the head neck region.  I confirmed adequate tip apex distance with fluoroscopic imaging.  I then measured and chose to use a 95 mm lag screw with a 90 mm compression screw.  The path for the compression screw with drilled and a antirotation bar was placed.  I then drilled the path for the lag screw.  The lag screw was placed and tip apex distance was adequate.  I then used the compression screw to provide about 5 mm of compression across the basicervical component of her fracture.  The nail lag screw interface was statically locked.  I then used the drop arm to place a distal interlocking screw through the femoral shaft.  Final fluoroscopic imaging was obtained after the jig was removed.  The incisions were copiously irrigated.  500 mg of vancomycin powder were placed into the incisions.  A layer closure of 2-0 Vicryl and 3-0 Monocryl with Dermabond was placed.  Sterile dressings were placed to the left lower extremity.  The patient was then carefully transferred off of the Hana table to a radiolucent flat top table to address her left proximal humerus.  Bony prominences were well-padded.  Fluoroscopic imaging showed the unstable nature of her injury.  Of the left upper extremity was prepped and draped in usual sterile fashion.  A timeout was performed to verify the patient, the procedure, and the extremity.  A standard deltopectoral approach was made carried down through skin and subcutaneous tissue.  Identified the cephalic vein and developed the interval between the deltoid and the  pectoralis major.  I encountered the fracture and cleaned out the hematoma.  I was able to manipulate the fragment using a Cobb elevator to get it out of varus and I reduced the lateral cortex.  I used a #2 FiberWire to grasp the subscapularis tendon as well as the supraspinatus tendon and the infraspinatus tendon in a modified Mason-Allen stitch.  These were tagged and used for manipulation of the head and for repair through the plate at the end of the case.  I held the reduction with a provisional K wire..  I obtained fluoroscopic imaging to show that the reduction was adequate.  I then chose a proximal humeral locking plate from the Vowinckel set.  I held it provisionally with a K wire and confirmed adequate positioning with fluoroscopic imaging.  I then placed a nonlocking screw into the humeral shaft.  Once I was pleased with the alignment I then placed a calcar screw using a variable angle locking screw.  This was placed under fluoroscopic guidance.  Once I had the calcar screw in place I then proceeded to place locking screws in the humeral head.  A total of 6 locking screws were placed in addition to the calcar  VA screw.  I confirmed that all the screws were extra-articular.  I then returned to the humeral shaft and I placed a VA locking screw into the humeral shaft and the most distal screw I placed as a nonlocking screw to try to prevent a stress riser.  Final fluoroscopic imaging was obtained.  The incision was copiously irrigated.  I then used a free needle to pass the tag sutures for the rotator cuff tendons through the plate and tied these down.  I then placed on 1 g of vancomycin powder into the surgical incision.  A layered closure consisting of 2-0 Vicryl and 3-0 Monocryl was used to close the skin.  Dermabond was used to seal the skin.  A sterile dressing was placed over the incision.  The patient was then awoken from anesthesia and taken to the PACU in stable condition.  Post Op  Plan/Instructions: Patient will be nonweightbearing to the left upper extremity.  I will allow unrestricted passive and active range of motion as tolerated.  She will be weightbearing as tolerated to the left lower extremity.  She will receive postoperative Ancef.  She will receive Lovenox for DVT prophylaxis.  We will have her mobilize with physical and Occupational Therapy.  I was present and performed the entire surgery.  Ulyses Southward, PA-C did assist me throughout the case. An assistant was necessary given the difficulty in approach, maintenance of reduction and ability to instrument the fracture.   Truitt Merle, MD [facility]

## 2020-03-12 NOTE — ED Notes (Signed)
CBG Results of 165 reported to [place], Charity fundraiser.

## 2020-03-12 NOTE — Anesthesia Postprocedure Evaluation (Signed)
Anesthesia Post Note  Patient: Anna Ewing  Procedure(s) Performed: INTRAMEDULLARY (IM) NAIL INTERTROCHANTRIC (Left ) OPEN REDUCTION INTERNAL FIXATION (ORIF) PROXIMAL HUMERUS FRACTURE (Left )     Patient location during evaluation: PACU Anesthesia Type: General Level of consciousness: awake (at baseline) Pain management: pain level controlled Vital Signs Assessment: post-procedure vital signs reviewed and stable Respiratory status: spontaneous breathing, nonlabored ventilation and respiratory function stable Cardiovascular status: blood pressure returned to baseline and stable Postop Assessment: no apparent nausea or vomiting Anesthetic complications: no    Last Vitals:  Vitals:   03/12/20 2007 03/12/20 2013  BP:  (!) 148/94  Pulse: 98 (!) 102  Resp: (!) 22 (!) 28  Temp:    SpO2: 100% 100%    Last Pain:  Vitals:   03/12/20 2013  TempSrc:   PainSc: 0-No pain    LLE Motor Response: Purposeful movement (03/12/20 2013) LLE Sensation: Full sensation (03/12/20 2013) RLE Motor Response: Purposeful movement (03/12/20 2013) RLE Sensation: Full sensation (03/12/20 2013)      Beryle Lathe

## 2020-03-12 NOTE — Consult Note (Signed)
Orthopaedic Trauma Service (OTS) Consult   Patient ID: Anna Ewing MRN: [id number] DOB/AGE: 1929-01-25 86 y.o.  Reason for Consult: Left intertrochanteric femur fracture and left proximal humerus fracture Referring Physician: Dr. Meridee Score, MD [facility]  HPI: Anna Ewing is an 86 y.o. female who is being seen in consultation at the request of Dr. Charm Barges for evaluation of left intertrochanteric femur fracture and left proximal humerus fracture.  Patient was at home when she turned and fell landed on her hip and arm.  She now presents emergency room where x-rays showed an intratrochanteric femur fracture and a left proximal humerus fracture.  She does have some questionable history of dementia.  She states that she does not ambulate with a walker.  She lives at home with her husband.  They are independent.  She has no major medical problems.  She is not on any anticoagulation.  Past Medical History:  Diagnosis Date  . Chronic rhinitis   . Hyperlipidemia   . Hypertension     Past Surgical History:  Procedure Laterality Date  . MANDIBLE SURGERY     cosmetic  . NASAL SINUS SURGERY    . VESICOVAGINAL FISTULA CLOSURE W/ TAH  2005    Family History  Problem Relation Age of Onset  . Cancer Mother   . Heart disease Mother   . Heart disease Father     Social History:  reports that she quit smoking about 45 years ago. Her smoking use included cigarettes. She has a 5.00 pack-year smoking history. She has never used smokeless tobacco. She reports that she does not drink alcohol or use drugs.  Allergies:  Allergies  Allergen Reactions  . Amoxicillin Rash    Medications:  No current facility-administered medications on file prior to encounter.   Current Outpatient Medications on File Prior to Encounter  Medication Sig Dispense Refill  . amLODipine (NORVASC) 5 MG tablet TAKE 1 TABLET BY MOUTH EVERY DAY (Patient taking differently: Take 5 mg by mouth daily. ) 90 tablet 3  .  loratadine-pseudoephedrine (CLARITIN-D 12-HOUR) 5-120 MG per tablet Take 1 tablet by mouth as needed.     . rosuvastatin (CRESTOR) 5 MG tablet Take 1 tablet (5 mg total) by mouth daily. 90 tablet 3  . ergocalciferol (DRISDOL) 50000 units capsule One po weekly for Vitamin D deficiency (Patient not taking: Reported on 03/12/2020) 12 capsule 1  . fluticasone (FLONASE) 50 MCG/ACT nasal spray 1 or 2 sprays each nostril, once daily at bedtime (Patient not taking: Reported on 03/12/2020) 16 g prn  . SUMAtriptan (IMITREX) 100 MG tablet One po at onset of migraine and may repeat once in 2 hours (Patient not taking: Reported on 03/12/2020) 30 tablet 1    ROS: Constitutional: No fever or chills Vision: No changes in vision ENT: No difficulty swallowing CV: No chest pain Pulm: No SOB or wheezing GI: No nausea or vomiting GU: No urgency or inability to hold urine Skin: No poor wound healing Neurologic: No numbness or tingling Psychiatric: No depression or anxiety Heme: No bruising Allergic: No reaction to medications or food   Exam: Blood pressure 121/72, pulse 97, temperature 98.3 F (36.8 C), temperature source Oral, resp. rate 13, height 5\' 2"  (1.575 m), weight 49.9 kg, SpO2 97 %. General: No acute distress Orientation:Awake alert and oriented Mood and Affect: Cooperate and pleasant Gait: Unable to assess due to fracture Coordination and balance: Within normal limits  Left lower extremity: Obvious deformity of left leg with shortening  and external rotation.  No significant skin lesions.  Pain with any attempted range of motion.  Compartments are soft compressible.  Warm well-perfused leg with intact motor and sensory function distally.  Left upper extremity: Obvious deformity to the shoulder region.  Bruising no skin tears.  Unable to tolerate any range of motion.  Compartments are soft compressible.  No instability about the elbow or wrist.  Warm well-perfused hand with median radial ulnar nerve  motor and sensory function intact.  Right upper and right lower extremity reveals skin without lesions.  No tenderness palpation.  Full painless range of motion with full strength in each muscle groups no evidence of instability.   Medical Decision Making: Data: Imaging: AP pelvis with AP and lateral of the left hip show a displaced intertrochanteric femur fracture with a significant varus and displacement.  2 views of the left shoulder show a displaced varus angulated proximal humerus fracture it appears to be a 2 part.  Labs:  Results for orders placed or performed during the hospital encounter of 03/12/20 (from the past 24 hour(s))  CBG monitoring, ED     Status: Abnormal   Collection Time: 03/12/20 11:37 AM  Result Value Ref Range   Glucose-Capillary 165 (H) 70 - 99 mg/dL  Basic metabolic panel     Status: Abnormal   Collection Time: 03/12/20 12:10 PM  Result Value Ref Range   Sodium 134 (L) 135 - 145 mmol/L   Potassium 3.9 3.5 - 5.1 mmol/L   Chloride 102 98 - 111 mmol/L   CO2 24 22 - 32 mmol/L   Glucose, Bld 185 (H) 70 - 99 mg/dL   BUN 14 8 - 23 mg/dL   Creatinine, Ser 9.32 0.44 - 1.00 mg/dL   Calcium 9.2 8.9 - 35.5 mg/dL   GFR calc non Af Amer >60 >60 mL/min   GFR calc Af Amer >60 >60 mL/min   Anion gap 8 5 - 15  CBC WITH DIFFERENTIAL     Status: Abnormal   Collection Time: 03/12/20 12:10 PM  Result Value Ref Range   WBC 9.9 4.0 - 10.5 K/uL   RBC 3.75 (L) 3.87 - 5.11 MIL/uL   Hemoglobin 11.4 (L) 12.0 - 15.0 g/dL   HCT 73.2 (L) 20.2 - 54.2 %   MCV 93.1 80.0 - 100.0 fL   MCH 30.4 26.0 - 34.0 pg   MCHC 32.7 30.0 - 36.0 g/dL   RDW 70.6 23.7 - 62.8 %   Platelets 258 150 - 400 K/uL   nRBC 0.0 0.0 - 0.2 %   Neutrophils Relative % 76 %   Neutro Abs 7.4 1.7 - 7.7 K/uL   Lymphocytes Relative 14 %   Lymphs Abs 1.4 0.7 - 4.0 K/uL   Monocytes Relative 7 %   Monocytes Absolute 0.7 0.1 - 1.0 K/uL   Eosinophils Relative 2 %   Eosinophils Absolute 0.2 0.0 - 0.5 K/uL    Basophils Relative 0 %   Basophils Absolute 0.0 0.0 - 0.1 K/uL   Immature Granulocytes 1 %   Abs Immature Granulocytes 0.05 0.00 - 0.07 K/uL  Type and screen [facility]     Status: None   Collection Time: 03/12/20 12:10 PM  Result Value Ref Range   ABO/RH(D) A POS    Antibody Screen NEG    Sample Expiration      03/15/2020,2359 Performed at [facility], [address], [place], [place] [address]   ABO/Rh  Status: None   Collection Time: 03/12/20 12:10 PM  Result Value Ref Range   ABO/RH(D)      A POS Performed at [facility] [address], [place], [place] [address]   Respiratory Panel by RT PCR (Flu A&B, Covid) - Nasopharyngeal Swab     Status: None   Collection Time: 03/12/20 12:16 PM   Specimen: Nasopharyngeal Swab  Result Value Ref Range   SARS Coronavirus 2 by RT PCR NEGATIVE NEGATIVE   Influenza A by PCR NEGATIVE NEGATIVE   Influenza B by PCR NEGATIVE NEGATIVE     Imaging or Labs ordered: None  Medical history and chart was reviewed and case discussed with medical provider.  Assessment/Plan: 86 year old female status post ground-level fall with left intertrochanteric femur fracture and left proximal humerus fracture.  Due to the displacement of her injuries I recommend proceeding with intramedullary nailing of left intertrochanteric femur fracture.  Risks and benefits were discussed with the patient's son.  He agrees to proceed with surgery.  Due to the displacement and unstable nature of her left proximal humerus I will also recommend proceeding with open reduction internal fixation to allow for better mobilization as well as increase range of motion and decrease pain.  Unfortunately there is a high risk of complications including cut out however I feel that this would benefit her to proceed with open reduction internal fixation to provide the best optimal outcome.  I discussed all these risks and benefits with the son and he agrees to  proceed with surgery we will plan to perform both of those today.  Shona Needles, MD [facility] [phone] (office) [email or web address]

## 2020-03-12 NOTE — ED Triage Notes (Addendum)
Pt arrives via [facility] with complaints of fall. Pt from home, lives with husband. Pt was walking in kitchen when she tripped and fall. Last food and drink at 0800 today. EMS report left hip deformity and left shoulder deformity. Pt is alert and oriented X4. Pain 10/10  Denies LOC  Pt not on blood thinners.   140/80 HR 90 98% RA RR 18

## 2020-03-12 NOTE — Anesthesia Procedure Notes (Signed)
Arterial Line Insertion Start/End4/25/2021 3:30 PM, 03/12/2020 4:00 PM Performed by: Beryle Lathe, MD, Claudina Lick, CRNA, CRNA  Patient location: OR. Preanesthetic checklist: patient identified, IV checked, site marked, risks and benefits discussed, surgical consent, monitors and equipment checked, pre-op evaluation, timeout performed and anesthesia consent Right, radial was placed Catheter size: 20 G Maximum sterile barriers used  Allen's test indicative of satisfactory collateral circulation Attempts: 1 Procedure performed without using ultrasound guided technique. Ultrasound Notes:anatomy identified, needle tip was noted to be adjacent to the nerve/plexus identified and no ultrasound evidence of intravascular and/or intraneural injection Following insertion, dressing applied and Biopatch. Patient tolerated the procedure well with no immediate complications.

## 2020-03-12 NOTE — ED Provider Notes (Signed)
[facility] EMERGENCY DEPARTMENT Provider Note   CSN: [id number] Arrival date & time: 03/12/20  1135     History No chief complaint on file. CC: fall  Anna Ewing is a 86 y.o. female.  She said she was at home in the kitchen when she turned and then fell to the ground.  She landed on her left hip and left shoulder.  She is complaining of 10 out of 10 pain in those areas.  She denies hitting her head and there is no loss of consciousness.  No headache no neck pain no back pain no chest pain no abdominal pain.  No numbness or weakness.  The history is provided by the patient.  Fall This is a new problem. The current episode started 1 to 2 hours ago. The problem occurs rarely. The problem has not changed since onset.Pertinent negatives include no chest pain, no abdominal pain, no headaches and no shortness of breath. The symptoms are aggravated by bending and twisting. Nothing relieves the symptoms. She has tried nothing for the symptoms. The treatment provided no relief.  Hip Pain This is a new problem. The current episode started 1 to 2 hours ago. The problem has not changed since onset.Pertinent negatives include no chest pain, no abdominal pain, no headaches and no shortness of breath. The symptoms are aggravated by twisting. Nothing relieves the symptoms. She has tried nothing for the symptoms. The treatment provided no relief.       Past Medical History:  Diagnosis Date  . Chronic rhinitis   . Hyperlipidemia     Patient Active Problem List   Diagnosis Date Noted  . Hyponatremia 08/25/2018  . Impaired glucose tolerance 02/11/2017  . Weight loss 02/11/2017  . History of migraine headaches 02/11/2017  . Hearing loss 01/30/2013  . Osteoporosis 09/22/2011  . Irritable bowel syndrome 09/22/2011  . Allergic rhinitis due to pollen 01/22/2011  . HYPERLIPIDEMIA 02/06/2010  . HYPERTENSION 02/06/2010  . RHINITIS, CHRONIC 02/06/2010    Past Surgical History:    Procedure Laterality Date  . MANDIBLE SURGERY     cosmetic  . VESICOVAGINAL FISTULA CLOSURE W/ TAH  2005     OB History   No obstetric history on file.     Family History  Problem Relation Age of Onset  . Cancer Mother   . Heart disease Mother   . Heart disease Father     Social History   Tobacco Use  . Smoking status: Former Smoker    Packs/day: 0.50    Years: 10.00    Pack years: 5.00    Types: Cigarettes    Quit date: 09/13/1974    Years since quitting: 45.5  . Smokeless tobacco: Never Used  Substance Use Topics  . Alcohol use: Not on file  . Drug use: Not on file    Home Medications Prior to Admission medications   Medication Sig Start Date End Date Taking? Authorizing Provider  amLODipine (NORVASC) 5 MG tablet TAKE 1 TABLET BY MOUTH EVERY DAY 12/11/19   Margaree Mackintosh, MD  ergocalciferol (DRISDOL) 50000 units capsule One po weekly for Vitamin D deficiency 08/25/18   Margaree Mackintosh, MD  fluticasone [facility]) 50 MCG/ACT nasal spray 1 or 2 sprays each nostril, once daily at bedtime 07/01/11   Jetty Duhamel D, MD  loratadine-pseudoephedrine (CLARITIN-D 12-HOUR) 5-120 MG per tablet Take 1 tablet by mouth as needed.     [provider]  NONFORMULARY OR COMPOUNDED ITEM Allergy Vaccine 1:10  Given at [facility]    [provider]  rosuvastatin (CRESTOR) 5 MG tablet Take 1 tablet (5 mg total) by mouth daily. 05/13/19   Margaree Mackintosh, MD  sodium chloride (OCEAN) 0.65 % nasal spray Place 1 spray into the nose as needed for congestion.    [provider]  SUMAtriptan (IMITREX) 100 MG tablet One po at onset of migraine and may repeat once in 2 hours 10/02/15   Baxley, [name], MD    Allergies    Amoxicillin  Review of Systems   Review of Systems  Constitutional: Negative for fever.  HENT: Negative for sore throat.   Eyes: Negative for visual disturbance.  Respiratory: Negative for shortness of breath.   Cardiovascular: Negative for  chest pain.  Gastrointestinal: Negative for abdominal pain.  Genitourinary: Negative for dysuria.  Musculoskeletal: Negative for neck pain.  Skin: Negative for rash.  Neurological: Negative for headaches.    Physical Exam Updated Vital Signs BP (!) 160/72 (BP Location: Right Arm)   Pulse 91   Temp 98.3 F (36.8 C) (Oral)   Resp 20   SpO2 100%   Physical Exam Vitals and nursing note reviewed.  Constitutional:      General: She is not in acute distress.    Appearance: Normal appearance. She is well-developed.  HENT:     Head: Normocephalic and atraumatic.  Eyes:     Conjunctiva/sclera: Conjunctivae normal.  Cardiovascular:     Rate and Rhythm: Normal rate and regular rhythm.     Pulses: Normal pulses.     Heart sounds: Murmur present.  Pulmonary:     Effort: Pulmonary effort is normal. No respiratory distress.     Breath sounds: Normal breath sounds.  Abdominal:     Palpations: Abdomen is soft.     Tenderness: There is no abdominal tenderness. There is no guarding or rebound.  Musculoskeletal:        General: Tenderness and deformity present.     Cervical back: Neck supple.     Right lower leg: No edema.     Left lower leg: No edema.     Comments: She has nontender neck and back.  Full range of motion nontender right upper and lower extremities.  She has tenderness of her left proximal humerus and shoulder.  Left elbow wrist and hand nontender.  Left hip is tender and there is some shortening and external rotation.  Nontender knee and ankle on the left.  Intact distal pulses motor and sensation.  Skin:    General: Skin is warm and dry.     Capillary Refill: Capillary refill takes less than 2 seconds.  Neurological:     General: No focal deficit present.     Mental Status: She is alert. Mental status is at baseline.     Sensory: No sensory deficit.     Motor: No weakness.     ED Results / Procedures / Treatments   Labs (all labs ordered are listed, but only  abnormal results are displayed) Labs Reviewed  BASIC METABOLIC PANEL - Abnormal; Notable for the following components:      Result Value   Sodium 134 (*)    Glucose, Bld 185 (*)    All other components within normal limits  CBC WITH DIFFERENTIAL/PLATELET - Abnormal; Notable for the following components:   RBC 3.75 (*)    Hemoglobin 11.4 (*)    HCT 34.9 (*)    All other components within normal limits  CBG  MONITORING, ED - Abnormal; Notable for the following components:   Glucose-Capillary 165 (*)    All other components within normal limits  RESPIRATORY PANEL BY RT PCR (FLU A&B, COVID)  VITAMIN D 25 HYDROXY (VIT D DEFICIENCY, FRACTURES)  CBC  TYPE AND SCREEN  ABO/RH    EKG EKG Interpretation  Date/Time:  Sunday March 12 2020 11:43:34 EDT Ventricular Rate:  83 PR Interval:    QRS Duration: 75 QT Interval:  483 QTC Calculation: 568 R Axis:   81 Text Interpretation: Sinus rhythm Borderline right axis deviation Probable LVH with secondary repol abnrm Prolonged QT interval No old tracing to compare Confirmed by Meridee Score [phone]) on 03/12/2020 11:54:30 AM   Radiology DG Chest 1 View  Result Date: 03/12/2020 CLINICAL DATA:  Fall, shoulder pain EXAM: CHEST  1 VIEW COMPARISON:  CT chest of 07/02/2012 FINDINGS: Cardiomediastinal contours and hilar structures are normal. No consolidation. No pleural effusion. No pneumothorax. LEFT-sided rib fracture at the eighth rib with more chronic appearance, LEFT posterior ninth rib is age indeterminate. Displaced and angulated humeral neck fracture partially visualized. IMPRESSION: 1. No acute cardiopulmonary disease. Impacted angulated humeral neck fracture on the LEFT partially visualized. 2. LEFT-sided rib fractures, age indeterminate but with suggestion of chronic features. Correlate with any point tenderness in these areas. Electronically Signed   By: Donzetta Kohut M.D.   On: 03/12/2020 13:21   CT HEAD WO CONTRAST  Result Date:  03/12/2020 CLINICAL DATA:  Larey Seat. Hit head. EXAM: CT HEAD WITHOUT CONTRAST CT CERVICAL SPINE WITHOUT CONTRAST TECHNIQUE: Multidetector CT imaging of the head and cervical spine was performed following the standard protocol without intravenous contrast. Multiplanar CT image reconstructions of the cervical spine were also generated. COMPARISON:  MRI brain 07/16/2017 FINDINGS: CT HEAD FINDINGS Brain: Stable age related cerebral atrophy, ventriculomegaly and periventricular white matter disease. No extra-axial fluid collections are identified. No CT findings for acute hemispheric infarction or intracranial hemorrhage. No mass lesions. The brainstem and cerebellum are normal. Vascular: Vascular calcifications but no definite aneurysm or hyperdense vessels. Skull: No skull fracture or bone lesion. Sinuses/Orbits: The paranasal sinuses and mastoid air cells are clear. The globes are intact. Other: No scalp lesions or hematoma. CT CERVICAL SPINE FINDINGS Alignment: Normal overall alignment of the cervical vertebral bodies. Mild multilevel degenerative subluxations are noted due to advanced degenerative disc disease and facet disease. Skull base and vertebrae: No acute fractures are identified. Advanced facet disease noted with fused facet joints on the right at C4-5. Soft tissues and spinal canal: No prevertebral fluid or swelling. No visible canal hematoma. Disc levels: The spinal canal is fairly generous. No large disc protrusions or significant spinal stenosis. Mild multilevel foraminal stenosis due to uncinate spurring and facet disease. Upper chest: The lung apices are grossly clear. Biapical pleural and parenchymal scarring changes with calcifications are noted. Other: Bilateral carotid artery calcifications are noted. IMPRESSION: 1. Stable age related cerebral atrophy, ventriculomegaly and periventricular white matter disease. 2. No acute intracranial findings or skull fracture. 3. Advanced degenerative cervical disc  disease and facet disease but no acute cervical spine fracture. Electronically Signed   By: Rudie Meyer M.D.   On: 03/12/2020 13:28   CT CERVICAL SPINE WO CONTRAST  Result Date: 03/12/2020 CLINICAL DATA:  Larey Seat. Hit head. EXAM: CT HEAD WITHOUT CONTRAST CT CERVICAL SPINE WITHOUT CONTRAST TECHNIQUE: Multidetector CT imaging of the head and cervical spine was performed following the standard protocol without intravenous contrast. Multiplanar CT image reconstructions of the cervical spine were  also generated. COMPARISON:  MRI brain 07/16/2017 FINDINGS: CT HEAD FINDINGS Brain: Stable age related cerebral atrophy, ventriculomegaly and periventricular white matter disease. No extra-axial fluid collections are identified. No CT findings for acute hemispheric infarction or intracranial hemorrhage. No mass lesions. The brainstem and cerebellum are normal. Vascular: Vascular calcifications but no definite aneurysm or hyperdense vessels. Skull: No skull fracture or bone lesion. Sinuses/Orbits: The paranasal sinuses and mastoid air cells are clear. The globes are intact. Other: No scalp lesions or hematoma. CT CERVICAL SPINE FINDINGS Alignment: Normal overall alignment of the cervical vertebral bodies. Mild multilevel degenerative subluxations are noted due to advanced degenerative disc disease and facet disease. Skull base and vertebrae: No acute fractures are identified. Advanced facet disease noted with fused facet joints on the right at C4-5. Soft tissues and spinal canal: No prevertebral fluid or swelling. No visible canal hematoma. Disc levels: The spinal canal is fairly generous. No large disc protrusions or significant spinal stenosis. Mild multilevel foraminal stenosis due to uncinate spurring and facet disease. Upper chest: The lung apices are grossly clear. Biapical pleural and parenchymal scarring changes with calcifications are noted. Other: Bilateral carotid artery calcifications are noted. IMPRESSION: 1.  Stable age related cerebral atrophy, ventriculomegaly and periventricular white matter disease. 2. No acute intracranial findings or skull fracture. 3. Advanced degenerative cervical disc disease and facet disease but no acute cervical spine fracture. Electronically Signed   By: Rudie MeyerP.  Gallerani M.D.   On: 03/12/2020 13:28   DG Shoulder Left  Result Date: 03/12/2020 CLINICAL DATA:  Larey SeatFell. Left shoulder pain. EXAM: LEFT SHOULDER - 2+ VIEW COMPARISON:  None. FINDINGS: There is a displaced fracture of the humeral head and neck. Transverse fracture through the humeral neck with moderate impaction and medial and posterior displacement of the humeral head. The visualized left lung is clear and the visualized left ribs are intact. Remote healed left rib fractures are noted. IMPRESSION: Displaced and impacted humeral head and neck fractures. Electronically Signed   By: Rudie MeyerP.  Gallerani M.D.   On: 03/12/2020 13:22   DG Shoulder Left Port  Result Date: 03/12/2020 CLINICAL DATA:  Left shoulder fracture repair EXAM: LEFT SHOULDER COMPARISON:  03/12/2020 FINDINGS: Frontal and transscapular views of the left shoulder demonstrate plate and screw fixation across the humeral neck fracture with anatomic alignment. No dislocation. Stable degenerative changes of the acromioclavicular joint. IMPRESSION: 1. ORIF proximal left humerus. Electronically Signed   By: Sharlet SalinaMichael  Brown M.D.   On: 03/12/2020 20:52   DG Humerus Left  Result Date: 03/12/2020 CLINICAL DATA:  Open reduction and internal fixation of the proximal left humerus EXAM: LEFT HUMERUS - 2+ VIEW COMPARISON:  None. FINDINGS: A radiopaque fixation plate and multiple fixation screws are seen overlying the left humeral head and proximal left humeral shaft. Acute fracture deformity is seen along the surgical neck of the proximal left humerus with anatomic alignment. There is no evidence of dislocation. Soft tissues are unremarkable. IMPRESSION: Open reduction and internal  fixation of the proximal left humerus with anatomic alignment. Electronically Signed   By: Aram Candelahaddeus  Houston M.D.   On: 03/12/2020 19:43   DG C-Arm 1-60 Min  Result Date: 03/12/2020 CLINICAL DATA:  Left femoral IM nail EXAM: LEFT FEMUR 2 VIEWS; DG C-ARM 1-60 MIN COMPARISON:  03/12/2020 FINDINGS: Eight fluoroscopic images are obtained during the performance of procedure and are provided for interpretation only. Images demonstrate intramedullary rod with proximal dynamic screw and distal interlocking screw traversing the intertrochanteric left hip fracture seen previously. Alignment is  near anatomic. FLUOROSCOPY TIME:  2 minutes 47 seconds IMPRESSION: 1. ORIF left hip as above. Electronically Signed   By: Sharlet Salina M.D.   On: 03/12/2020 19:50   DG HIP PORT UNILAT W OR W/O PELVIS 1V LEFT  Result Date: 03/12/2020 CLINICAL DATA:  Postop left hip repair, intertrochanteric fracture EXAM: DG HIP (WITH OR WITHOUT PELVIS) 1V PORT LEFT COMPARISON:  03/12/2020 FINDINGS: Frontal view of the pelvis and cross-table lateral view of the left hip are obtained. Intramedullary rod with proximal dynamic screws and distal interlocking screw traverse the inter trochanteric left hip fracture seen previously. Alignment is anatomic. Stable degenerative changes right hip. IMPRESSION: 1. ORIF left hip as above. Electronically Signed   By: Sharlet Salina M.D.   On: 03/12/2020 20:43   DG Hip Unilat With Pelvis 2-3 Views Left  Result Date: 03/12/2020 CLINICAL DATA:  86 year old female with acute LEFT hip pain following fall. Initial encounter. EXAM: DG HIP (WITH OR WITHOUT PELVIS) 2-3V LEFT COMPARISON:  None. FINDINGS: A comminuted intertrochanteric fracture of the LEFT proximal femur is noted with varus angulation. No subluxation or dislocation. Diffuse osteopenia is noted. IMPRESSION: Comminuted intertrochanteric LEFT femur fracture with varus angulation. Electronically Signed   By: Harmon Pier M.D.   On: 03/12/2020 13:19    DG FEMUR MIN 2 VIEWS LEFT  Result Date: 03/12/2020 CLINICAL DATA:  Left femoral IM nail EXAM: LEFT FEMUR 2 VIEWS; DG C-ARM 1-60 MIN COMPARISON:  03/12/2020 FINDINGS: Eight fluoroscopic images are obtained during the performance of procedure and are provided for interpretation only. Images demonstrate intramedullary rod with proximal dynamic screw and distal interlocking screw traversing the intertrochanteric left hip fracture seen previously. Alignment is near anatomic. FLUOROSCOPY TIME:  2 minutes 47 seconds IMPRESSION: 1. ORIF left hip as above. Electronically Signed   By: Sharlet Salina M.D.   On: 03/12/2020 19:50    Procedures Procedures (including critical care time)  Medications Ordered in ED Medications  SUMAtriptan (IMITREX) tablet 25 mg ( Oral MAR Unhold 03/12/20 2042)  amLODipine (NORVASC) tablet 5 mg ( Oral Canceled Entry 03/12/20 2100)  rosuvastatin (CRESTOR) tablet 5 mg ( Oral Canceled Entry 03/12/20 2058)  fluticasone (FLONASE) 50 MCG/ACT nasal spray 1 spray ( Each Nare MAR Unhold 03/12/20 2042)  sodium chloride (OCEAN) 0.65 % nasal spray 1 spray ( Nasal MAR Unhold 03/12/20 2042)  HYDROcodone-acetaminophen (NORCO/VICODIN) 5-325 MG per tablet 1-2 tablet ( Oral MAR Unhold 03/12/20 2042)  0.9 %  sodium chloride infusion ( Intravenous Continued from Pre-op 03/12/20 2056)  enoxaparin (LOVENOX) injection 40 mg (has no administration in time range)  HYDROmorphone (DILAUDID) injection 0.5 mg (0.5 mg Intravenous Given 03/12/20 2141)  ceFAZolin (ANCEF) IVPB 2g/100 mL premix (has no administration in time range)  methocarbamol (ROBAXIN) tablet 500 mg (has no administration in time range)  acetaminophen (TYLENOL) tablet 325-650 mg (has no administration in time range)  fentaNYL (SUBLIMAZE) injection 50 mcg (50 mcg Intravenous Given 03/12/20 1309)  HYDROmorphone (DILAUDID) injection 0.5 mg (0.5 mg Intravenous Given 03/12/20 1402)    ED Course  I have reviewed the triage vital signs and the  nursing notes.  Pertinent labs & imaging results that were available during my care of the patient were reviewed by me and considered in my medical decision making (see chart for details).  Clinical Course as of Mar 12 2145  Sun Mar 12, 2020  1253 X-rays interpreted by me as left proximal humerus fracture and left femoral neck fracture   [MB]  1254 EKG shows  some nonspecific flattening but does have some QTC prolongation.  No priors to compare with.   [MB]  1332 CT head and cervical spine interpreted by me and is no gross fractures or bleed.   [MB]  49 Discussed with Dr. Doreatha Martin from orthopedic surgery.  He asked for a medical admission and he anticipates taking the patient to the operating room tomorrow.   [MB]  5366 Discussed with [facility] hospitalist Dr. Roosevelt Locks who will evaluate the patient for admission.   [MB]  1420 Received a call back from Dr. Doreatha Martin.  He said he may end up doing surgery on her today.  He asked that we keep her n.p.o.  I updated Dr. Roosevelt Locks.   [MB]    Clinical Course User Index [MB] Hayden Rasmussen, MD   MDM Rules/Calculators/A&P                     This patient complains of left shoulder and left hip pain after a fall; this involves an extensive number of treatment Options and is a complaint that carries with it a high risk of complications and Morbidity. The differential includes dislocation, fracture, contusion.  Also concerned with cervical spine fracture and intracranial bleed due to age and mechanism.  I ordered, reviewed and interpreted labs, which included slightly lower hemoglobin 11.4 and elevated glucose of 185. I ordered medication fentanyl with minimal improvement and Dilaudid with good improvement in her pain. I ordered imaging studies which included CT head cervical spine along with x-rays of her left shoulder pelvis and left hip and I independently    visualized and interpreted imaging which showed no acute findings of head or C-spine, does show new  intertrochanteric fracture and proximal humerus fracture Additional history from EMS Previous records obtained and reviewed in epic I consulted orthopedics Dr. Doreatha Martin along with [facility] hospitalist Dr. Roosevelt Locks and discussed lab and imaging findings  Critical Interventions: None  After the interventions stated above, I reevaluated the patient and found patient's pain to be improved.  I updated her on the results of her testing and her fractures.  The nurse was able to reach out to her family and update them.  Orthopedics planning on operative intervention either this afternoon or tomorrow.  Patient was made NPO.  Final Clinical Impression(s) / ED Diagnoses Final diagnoses:  Closed left hip fracture, initial encounter [facility])  Closed fracture of proximal end of left humerus, unspecified fracture morphology, initial encounter  Fall, initial encounter    Rx / DC Orders ED Discharge Orders    None       Hayden Rasmussen, MD 03/12/20 2149

## 2020-03-12 NOTE — Anesthesia Procedure Notes (Signed)
Procedure Name: Intubation Date/Time: 03/12/2020 4:45 PM Performed by: Claudina Lick, CRNA Pre-anesthesia Checklist: Patient identified, Emergency Drugs available, Suction available, Patient being monitored and Timeout performed Patient Re-evaluated:Patient Re-evaluated prior to induction Oxygen Delivery Method: Circle system utilized Preoxygenation: Pre-oxygenation with 100% oxygen Induction Type: IV induction Ventilation: Mask ventilation without difficulty Laryngoscope Size: Miller and 2 Grade View: Grade I Tube type: Oral Tube size: 7.5 mm Number of attempts: 1 Airway Equipment and Method: Stylet Placement Confirmation: ETT inserted through vocal cords under direct vision,  positive ETCO2 and breath sounds checked- equal and bilateral Secured at: 21 cm Tube secured with: Tape Dental Injury: Teeth and Oropharynx as per pre-operative assessment

## 2020-03-12 NOTE — Anesthesia Preprocedure Evaluation (Addendum)
Anesthesia Evaluation  Patient identified by MRN, date of birth, ID band Patient confused    Reviewed: Allergy & Precautions, NPO status , Patient's Chart, lab work & pertinent test results  History of Anesthesia Complications Negative for: history of anesthetic complications  Airway Mallampati: II  TM Distance: >3 FB Neck ROM: Full    Dental  (+) Dental Advisory Given   Pulmonary former smoker,    Pulmonary exam normal        Cardiovascular hypertension, Pt. on medications + Valvular Problems/Murmurs AS  Rhythm:Regular Rate:Normal + Systolic murmurs  '19 TTE - EF 65%to 70%. Grade 1 diastolicdysfunction. Mild focal basal septal hypertrophy. Moderate-severe AS, trivial AI. Peak velocity (S): 343 cm/s. Mean gradient (S): 21 mm Hg. Valve area (Vmean): 0.88 cm^2. Mild MR and TR.    Neuro/Psych PSYCHIATRIC DISORDERS Dementia  Hearing loss     GI/Hepatic negative GI ROS, Neg liver ROS,   Endo/Other  negative endocrine ROS  Renal/GU negative Renal ROS     Musculoskeletal negative musculoskeletal ROS (+)   Abdominal   Peds  Hematology  (+) anemia ,   Anesthesia Other Findings   Reproductive/Obstetrics                            Anesthesia Physical Anesthesia Plan  ASA: IV  Anesthesia Plan: General   Post-op Pain Management:    Induction: Intravenous  PONV Risk Score and Plan: 4 or greater and Treatment may vary due to age or medical condition, Ondansetron and Propofol infusion  Airway Management Planned: Oral ETT  Additional Equipment: Arterial line  Intra-op Plan:   Post-operative Plan: Extubation in OR  Informed Consent: I have reviewed the patients History and Physical, chart, labs and discussed the procedure including the risks, benefits and alternatives for the proposed anesthesia with the patient or authorized representative who has indicated his/her understanding and  acceptance.     Dental advisory given and Consent reviewed with POA  Plan Discussed with: CRNA, Anesthesiologist and Surgeon  Anesthesia Plan Comments: (Discussed history with and obtained consent from spouse, Samary Shatz, via telephone number listed in chart. He was understanding of the risks presented and agreeable to proceeding with anesthesia for this procedure.)      Anesthesia Quick Evaluation

## 2020-03-13 ENCOUNTER — Encounter: Payer: Self-pay | Admitting: *Deleted

## 2020-03-13 DIAGNOSIS — S72002A Fracture of unspecified part of neck of left femur, initial encounter for closed fracture: Secondary | ICD-10-CM

## 2020-03-13 LAB — CBC
HCT: 24.4 % — ABNORMAL LOW (ref 36.0–46.0)
Hemoglobin: 8.1 g/dL — ABNORMAL LOW (ref 12.0–15.0)
MCH: 30.2 pg (ref 26.0–34.0)
MCHC: 33.2 g/dL (ref 30.0–36.0)
MCV: 91 fL (ref 80.0–100.0)
Platelets: 207 10*3/uL (ref 150–400)
RBC: 2.68 MIL/uL — ABNORMAL LOW (ref 3.87–5.11)
RDW: 13.3 % (ref 11.5–15.5)
WBC: 9 10*3/uL (ref 4.0–10.5)
nRBC: 0 % (ref 0.0–0.2)

## 2020-03-13 LAB — VITAMIN D 25 HYDROXY (VIT D DEFICIENCY, FRACTURES): Vit D, 25-Hydroxy: 23.69 ng/mL — ABNORMAL LOW (ref 30–100)

## 2020-03-13 MED ORDER — ENSURE ENLIVE PO LIQD
237.0000 mL | Freq: Two times a day (BID) | ORAL | Status: DC
  Administered 2020-03-13 – 2020-03-16 (×5): 237 mL via ORAL

## 2020-03-13 MED ORDER — ENOXAPARIN SODIUM 40 MG/0.4ML ~~LOC~~ SOLN
40.0000 mg | SUBCUTANEOUS | 0 refills | Status: DC
Start: 2020-03-13 — End: 2020-08-17

## 2020-03-13 MED ORDER — HYDROCODONE-ACETAMINOPHEN 5-325 MG PO TABS: 1.0000 | ORAL_TABLET | Freq: Four times a day (QID) | ORAL | 0 refills | Status: DC | PRN

## 2020-03-13 MED ORDER — CHLORHEXIDINE GLUCONATE CLOTH 2 % EX PADS: 6.0000 | MEDICATED_PAD | Freq: Every day | CUTANEOUS | Status: DC

## 2020-03-13 NOTE — Discharge Instructions (Signed)
[facility] Discharge Instructions   General Discharge Instructions  WEIGHT BEARING STATUS: Non-weightbearing left upper extremity, weightbearing as tolerated left lower extremity  RANGE OF MOTION/ACTIVITY: Okay for gentle shoulder motion. Wear sling for comfort. Unrestricted range of motion of left leg  Wound Care: Incisions can be left open to air if there is no drainage. If incision continues to have drainage, follow wound care instructions below. Okay to shower if no drainage from incisions.  DVT/PE prophylaxis: Lovenox x 30 days  Diet: as you were eating previously.  Can use over the counter stool softeners and bowel preparations, such as Miralax, to help with bowel movements.  Narcotics can be constipating.  Be sure to drink plenty of fluids  PAIN MEDICATION USE AND EXPECTATIONS  You have likely been given narcotic medications to help control your pain.  After a traumatic event that results in an fracture (broken bone) with or without surgery, it is ok to use narcotic pain medications to help control one's pain.  We understand that everyone responds to pain differently and each individual patient will be evaluated on a regular basis for the continued need for narcotic medications. Ideally, narcotic medication use should last no more than 6-8 weeks (coinciding with fracture healing).   As a patient it is your responsibility as well to monitor narcotic medication use and report the amount and frequency you use these medications when you come to your office visit.   We would also advise that if you are using narcotic medications, you should take a dose prior to therapy to maximize you participation.  IF YOU ARE ON NARCOTIC MEDICATIONS IT IS NOT PERMISSIBLE TO OPERATE A MOTOR VEHICLE (MOTORCYCLE/CAR/TRUCK/MOPED) OR HEAVY MACHINERY DO NOT MIX NARCOTICS WITH OTHER CNS (CENTRAL NERVOUS SYSTEM) DEPRESSANTS SUCH AS ALCOHOL   STOP SMOKING OR USING NICOTINE PRODUCTS!!!!  As  discussed nicotine severely impairs your body's ability to heal surgical and traumatic wounds but also impairs bone healing.  Wounds and bone heal by forming microscopic blood vessels (angiogenesis) and nicotine is a vasoconstrictor (essentially, shrinks blood vessels).  Therefore, if vasoconstriction occurs to these microscopic blood vessels they essentially disappear and are unable to deliver necessary nutrients to the healing tissue.  This is one modifiable factor that you can do to dramatically increase your chances of healing your injury.    (This means no smoking, no nicotine gum, patches, etc)  DO NOT USE NONSTEROIDAL ANTI-INFLAMMATORY DRUGS (NSAID'S)  Using products such as Advil (ibuprofen), Aleve (naproxen), Motrin (ibuprofen) for additional pain control during fracture healing can delay and/or prevent the healing response.  If you would like to take over the counter (OTC) medication, Tylenol (acetaminophen) is ok.  However, some narcotic medications that are given for pain control contain acetaminophen as well. Therefore, you should not exceed more than 4000 mg of tylenol in a day if you do not have liver disease.  Also note that there are may OTC medicines, such as cold medicines and allergy medicines that my contain tylenol as well.  If you have any questions about medications and/or interactions please ask your doctor/PA or your pharmacist.      ICE AND ELEVATE INJURED/OPERATIVE EXTREMITY  Using ice and elevating the injured extremity above your heart can help with swelling and pain control.  Icing in a pulsatile fashion, such as 20 minutes on and 20 minutes off, can be followed.    Do not place ice directly on skin. Make sure there is a barrier between to skin  and the ice pack.    Using frozen items such as frozen peas works well as the conform nicely to the are that needs to be iced.  USE AN ACE WRAP OR TED HOSE FOR SWELLING CONTROL  In addition to icing and elevation, Ace wraps or TED  hose are used to help limit and resolve swelling.  It is recommended to use Ace wraps or TED hose until you are informed to stop.    When using Ace Wraps start the wrapping distally (farthest away from the body) and wrap proximally (closer to the body)   Example: If you had surgery on your leg or thing and you do not have a splint on, start the ace wrap at the toes and work your way up to the thigh        If you had surgery on your upper extremity and do not have a splint on, start the ace wrap at your fingers and work your way up to the upper arm  IF YOU ARE IN A SPLINT OR CAST DO NOT REMOVE IT FOR ANY REASON   If your splint gets wet for any reason please contact the office immediately. You may shower in your splint or cast as long as you keep it dry.  This can be done by wrapping in a cast cover or garbage back (or similar)  Do Not stick any thing down your splint or cast such as pencils, money, or hangers to try and scratch yourself with.  If you feel itchy take benadryl as prescribed on the bottle for itching  IF YOU ARE IN A CAM BOOT (BLACK BOOT)  You may remove boot periodically. Perform daily dressing changes as noted below.  Wash the liner of the boot regularly and wear a sock when wearing the boot. It is recommended that you sleep in the boot until told otherwise   CALL THE OFFICE WITH ANY QUESTIONS OR CONCERNS: [phone]   VISIT OUR WEBSITE FOR ADDITIONAL INFORMATION: [email or web address]     Discharge Wound Care Instructions  Do NOT apply any ointments, solutions or lotions to pin sites or surgical wounds.  These prevent needed drainage and even though solutions like hydrogen peroxide kill bacteria, they also damage cells lining the pin sites that help fight infection.  Applying lotions or ointments can keep the wounds moist and can cause them to breakdown and open up as well. This can increase the risk for infection. When in doubt call the office.  Surgical incisions should be  dressed daily.  If any drainage is noted, use one layer of adaptic, then gauze, Kerlix, and an ace wrap.  Once the incision is completely dry and without drainage, it may be left open to air out.  Showering may begin 36-48 hours later.  Cleaning gently with soap and water.  Traumatic wounds should be dressed daily as well.    One layer of adaptic, gauze, Kerlix, then ace wrap.  The adaptic can be discontinued once the draining has ceased    If you have a wet to dry dressing: wet the gauze with saline the squeeze as much saline out so the gauze is moist (not soaking wet), place moistened gauze over wound, then place a dry gauze over the moist one, followed by Kerlix wrap, then ace wrap.

## 2020-03-13 NOTE — TOC Initial Note (Signed)
Transition of Care [facility]) - Initial/Assessment Note    Patient Details  Name: Anna Ewing MRN: [id number] Date of Birth: 1929-10-01  [facility]) CM/SW Contact:    Sharin Mons, RN Phone Number: 03/13/2020, 1:57 PM  Clinical Narrative:  Presented with mechanical fall.  Hx of HTN, HLD.       - s/p left femur IM nail and ORIF left proximal humerus, 03/12/2020   Pt from home with husband. Pt very HOH. NCM spoke with pt @ bedside regarding TOC needs, SNF placement . NCM shared PT's evaluation and recommendation. Pt stated she doesn't want to go to SNF, however, gave NCM the ok for  SNF workup just in case she changes her mind. Pt also gave NCM the ok to speak with son and husband about TOC needs. NCM called and spoke with son by phone and shared PT'S evaluation/recommendation. Son stated he will discuss with his dad and f/u with NCM. NCM made son aware mom gave NCM the ok to workup for SNF. Son stated their preference would be [facility] if needed.  NCM discussed insurance authorization process and shared  Medicare SNF ratings list left @ bedside with mom. No further questions reported at this time. NCM to continue to follow and assist with discharge planning needs.   Keitha Butte (Spouse) [name] [phone])    [phone]         Expected Discharge Plan: Skilled Nursing Facility Barriers to Discharge: Continued Medical Work up   Patient Goals and CMS Choice   CMS Medicare.gov Compare Post Acute Care list provided to:: Patient    Expected Discharge Plan and Services Expected Discharge Plan: [place]        Prior Living Arrangements/Services                       Activities of Daily Living Home Assistive Devices/Equipment: Hearing aid ADL Screening (condition at time of admission) Patient's cognitive ability adequate to safely complete daily activities?: Yes Is the patient deaf or have difficulty hearing?:  Yes Does the patient have difficulty seeing, even when wearing glasses/contacts?: No Does the patient have difficulty concentrating, remembering, or making decisions?: Yes(short term memory loss) Patient able to express need for assistance with ADLs?: Yes Does the patient have difficulty dressing or bathing?: No Independently performs ADLs?: Yes (appropriate for developmental age) Does the patient have difficulty walking or climbing stairs?: Yes Weakness of Legs: Left Weakness of Arms/Hands: Left  Permission Sought/Granted                  Emotional Assessment              Admission diagnosis:  Fall, initial encounter [W19.XXXA] Closed left hip fracture, initial encounter ([place]) [S72.002A] Hip fracture requiring operative repair, left, closed, initial encounter ([place]) [S72.002A] Closed fracture of proximal end of left humerus, unspecified fracture morphology, initial encounter [S42.202A] Patient Active Problem List   Diagnosis Date Noted  . Hip fracture, unspecified laterality, closed, initial encounter ([place]) 03/12/2020  . Humerus head fracture, left, closed, initial encounter 03/12/2020  . Hip fracture requiring operative repair, left, closed, initial encounter ([place]) 03/12/2020  . Hyponatremia 08/25/2018  . Impaired glucose tolerance 02/11/2017  . Weight loss 02/11/2017  . History of migraine headaches 02/11/2017  . Hearing loss 01/30/2013  . Osteoporosis 09/22/2011  . Irritable bowel syndrome 09/22/2011  . Allergic rhinitis due to pollen 01/22/2011  . HYPERLIPIDEMIA 02/06/2010  . HYPERTENSION  02/06/2010  . RHINITIS, CHRONIC 02/06/2010   PCP:  Margaree Mackintosh, MD Pharmacy:   [facility] [phone] - [place], [place] - [address] [place] [address] Phone: [phone] Fax: [phone]  [facility] - [place], [place] - [address] [facility] [address] [place] [address] [place] [address] Phone: [phone] Fax:  [phone]     Social Determinants of Health (SDOH) Interventions    Readmission Risk Interventions No flowsheet data found.

## 2020-03-13 NOTE — Plan of Care (Signed)
  Problem: Education: Goal: Verbalization of understanding the information provided (i.e., activity precautions, restrictions, etc) will improve Outcome: Progressing   Problem: Activity: Goal: Ability to ambulate and perform ADLs will improve Outcome: Progressing   Problem: Pain Management: Goal: Pain level will decrease Outcome: Progressing   

## 2020-03-13 NOTE — Progress Notes (Signed)
Initial Nutrition Assessment  DOCUMENTATION CODES:   Not applicable  INTERVENTION:   Ensure Enlive po BID, each supplement provides 350 kcal and 20 grams of protein  Encourage PO intake    NUTRITION DIAGNOSIS:   Increased nutrient needs related to post-op healing as evidenced by estimated needs.  GOAL:   Patient will meet greater than or equal to 90% of their needs  MONITOR:   PO intake, Supplement acceptance  REASON FOR ASSESSMENT:   Consult Assessment of nutrition requirement/status  ASSESSMENT:   Pt with PMH of HTN, HLD who is admitted after fall with L hip fx and L humerus fx s/p IM nailing of L hip and ORIF of humerus.   Per chart review pt with stable weight.  Pt from home but planning for d/c to SNF.  Medications and labs reviewed    NUTRITION - FOCUSED PHYSICAL EXAM:  Deferred  Diet Order:   Diet Order            Diet Heart Room service appropriate? Yes; Fluid consistency: Thin  Diet effective now              EDUCATION NEEDS:   No education needs have been identified at this time  Skin:  Skin Assessment: Reviewed RN Assessment  Last BM:  unknown  Height:   Ht Readings from Last 1 Encounters:  03/12/20 5\' 2"  (1.575 m)    Weight:   Wt Readings from Last 1 Encounters:  03/12/20 49.9 kg    Ideal Body Weight:  50 kg  BMI:  Body mass index is 20.12 kg/m.  Estimated Nutritional Needs:   Kcal:  1300-1500  Protein:  70-80 grams  Fluid:  > 1.5 L/day  03/14/20., RD, LDN, CNSC See AMiON for contact information

## 2020-03-13 NOTE — Evaluation (Addendum)
Occupational Therapy Evaluation Patient Details Name: Anna Ewing MRN: [id number] DOB: 1929/06/16 Today's Date: 03/13/2020    History of Present Illness Anna Ewing is a 86 y.o. female with medical history significant of HTN, HLD, presented with mechanical fall this morning. s/p left femur IM nail and ORIF left proximal humerus.   Clinical Impression   PTA, pt was living at home with her husband, pt reports she was independent with ADL/IADL and functional mobility. Pt currently requires modA+2 for bed mobility and modA+2 for stand pivot transfer. She was able to tolerate full AROM digits, wrist and elbow, tolerated about 15* shoulder flexion. Due to decline in current level of function, pt would benefit from acute OT to address established goals to facilitate safe D/C to venue listed below. At this time, recommend SNF follow-up. Will continue to follow acutely.     Follow Up Recommendations  SNF;Supervision/Assistance - 24 hour    Equipment Recommendations  3 in 1 bedside commode    Recommendations for Other Services       Precautions / Restrictions Precautions Precautions: Fall Required Braces or Orthoses: Sling Restrictions Weight Bearing Restrictions: Yes LUE Weight Bearing: Non weight bearing LLE Weight Bearing: Weight bearing as tolerated per ortho note:allow unrestricted PROM and AROM as tolerated     Mobility Bed Mobility Overal bed mobility: Needs Assistance Bed Mobility: Supine to Sit     Supine to sit: Mod assist;+2 for physical assistance     General bed mobility comments: limited by decreased functional use of LUE  Transfers Overall transfer level: Needs assistance Equipment used: 1 person hand held assist Transfers: Sit to/from [facility] Sit to Stand: Mod assist;+2 physical assistance Stand pivot transfers: Mod assist;+2 physical assistance       General transfer comment: 1 therapist supporting pt with gait belt other supporting  pt with hand held assist    Balance Overall balance assessment: Needs assistance Sitting-balance support: Feet supported Sitting balance-Leahy Scale: Poor Sitting balance - Comments: reliant on single UE support on bed rails, therapist providing intermittent minA for stability   Standing balance support: Single extremity supported;During functional activity Standing balance-Leahy Scale: Poor Standing balance comment: reliant on single UE support with +2 therapist for support at gait belt during transfer                           ADL either performed or assessed with clinical judgement   ADL Overall ADL's : Needs assistance/impaired Eating/Feeding: Set up;Sitting   Grooming: Set up;Sitting   Upper Body Bathing: Minimal assistance;Sitting   Lower Body Bathing: Moderate assistance;Sit to/from stand   Upper Body Dressing : Moderate assistance Upper Body Dressing Details (indicate cue type and reason): modA for donning/doffing sling Lower Body Dressing: Moderate assistance;Sit to/from stand   Toilet Transfer: Moderate assistance;+2 for physical assistance;+2 for safety/equipment;Stand-pivot Toilet Transfer Details (indicate cue type and reason): simulated to recliner Toileting- Clothing Manipulation and Hygiene: Moderate assistance;+2 for physical assistance;+2 for safety/equipment       Functional mobility during ADLs: Moderate assistance;+2 for physical assistance;+2 for safety/equipment General ADL Comments: pt limited by pain, instability, decreased activity tolerance     Vision         Perception     Praxis      Pertinent Vitals/Pain Pain Assessment: Faces Faces Pain Scale: Hurts even more Pain Location: L UE and L LE with movement, exercise Pain Descriptors / Indicators: Discomfort;Grimacing;Guarding;Sore Pain Intervention(s): Monitored during session;Limited activity  within patient's tolerance     Hand Dominance Right   Extremity/Trunk Assessment  Upper Extremity Assessment Upper Extremity Assessment: LUE deficits/detail LUE Deficits / Details: NWB;limited shoulder flexion about 15*;full digit, wrist, elbow AROM  and PROM  LUE: Unable to fully assess due to pain LUE Sensation: WNL   Lower Extremity Assessment Lower Extremity Assessment: Generalized weakness LLE Sensation: WNL LLE Coordination: decreased gross motor   Cervical / Trunk Assessment Cervical / Trunk Assessment: Kyphotic   Communication Communication Communication: HOH   Cognition Arousal/Alertness: Awake/alert Behavior During Therapy: WFL for tasks assessed/performed Overall Cognitive Status: Within Functional Limits for tasks assessed                                 General Comments: HOH   General Comments       Exercises Exercises: General Upper Extremity General Exercises - Upper Extremity Shoulder Flexion: PROM;Left;10 reps;Seated Shoulder Extension: PROM;Left;10 reps;Seated Elbow Flexion: AROM;Seated;10 reps Elbow Extension: AROM;Seated;10 reps Wrist Flexion: AROM;Seated;10 reps Wrist Extension: AROM;Seated;10 reps Digit Composite Flexion: AROM;Seated;10 reps Composite Extension: AROM;Seated;10 reps Other Exercises Other Exercises: seated LAQ, long sitting ap, heel slides, hip abd/add x 10 reps each with min assist.   Shoulder Instructions      Home Living Family/patient expects to be discharged to:: Skilled nursing facility Living Arrangements: Spouse/significant other Available Help at Discharge: Family;Available 24 hours/day Type of Home: House Home Access: Level entry     Home Layout: Multi-level Alternate Level Stairs-Number of Steps: 2 steps from one froom to another   Bathroom Shower/Tub: Tub/shower unit;Walk-in shower         Home Equipment: Emergency planning/management officer - 2 wheels          Prior Functioning/Environment Level of Independence: Independent        Comments: reports she did not use AD prior to fall         OT Problem List: Decreased strength;Decreased range of motion;Decreased activity tolerance;Impaired balance (sitting and/or standing);Decreased safety awareness;Decreased knowledge of use of DME or AE;Decreased knowledge of precautions;Impaired UE functional use;Pain      OT Treatment/Interventions: Self-care/ADL training;Therapeutic exercise;DME and/or AE instruction;Therapeutic activities;Patient/family education;Balance training    OT Goals(Current goals can be found in the care plan section) Acute Rehab OT Goals Patient Stated Goal: to improve mobility OT Goal Formulation: With patient Time For Goal Achievement: 03/27/20 Potential to Achieve Goals: Good ADL Goals Pt Will Perform Grooming: with min guard assist;standing Pt Will Perform Upper Body Dressing: with min guard assist;sitting Pt Will Perform Lower Body Dressing: with min assist;sit to/from stand Pt Will Transfer to Toilet: ambulating;with min assist Additional ADL Goal #1: Pt will demonstrate independence with PROM/AROM LUE HEP with provided handout.  OT Frequency: Min 2X/week   Barriers to D/C: Decreased caregiver support          Co-evaluation PT/OT/SLP Co-Evaluation/Treatment: Yes Reason for Co-Treatment: For patient/therapist safety;To address functional/ADL transfers PT goals addressed during session: Mobility/safety with mobility;Balance OT goals addressed during session: ADL's and self-care      AM-PAC OT "6 Clicks" Daily Activity     Outcome Measure Help from another person eating meals?: A Little Help from another person taking care of personal grooming?: A Little Help from another person toileting, which includes using toliet, bedpan, or urinal?: A Lot Help from another person bathing (including washing, rinsing, drying)?: A Lot Help from another person to put on and taking off regular upper body clothing?: A  Lot Help from another person to put on and taking off regular lower body clothing?: A  Lot 6 Click Score: 14   End of Session Equipment Utilized During Treatment: Gait belt;Other (comment)(sling) Nurse Communication: Mobility status  Activity Tolerance: Patient tolerated treatment well Patient left: in chair;with call bell/phone within reach;with chair alarm set  OT Visit Diagnosis: Unsteadiness on feet (R26.81);Other abnormalities of gait and mobility (R26.89);Muscle weakness (generalized) (M62.81);History of falling (Z91.81);Pain Pain - Right/Left: Left Pain - part of body: Shoulder;Arm                Time: 2831-5176 OT Time Calculation (min): 21 min Charges:  OT General Charges $OT Visit: 1 Visit OT Evaluation $OT Eval Moderate Complexity: 1 Mod  Phinehas Grounds OTR/L Acute Rehabilitation Services Office: [phone]   Rebeca Alert 03/13/2020, 1:40 PM

## 2020-03-13 NOTE — Progress Notes (Signed)
Orthopaedic Trauma Progress Note  S: Doing well this morning, pain controlled.  States that she slept well.  Denies any numbness or tingling to her left upper or lower extremity.  Tolerating diet and fluids.  Denies any nausea or vomiting.  No questions or concerns currently  O:  Vitals:   03/13/20 0000 03/13/20 0809  BP: 126/69 139/70  Pulse: 86 90  Resp: 15 14  Temp: 98.5 F (36.9 C) 98.9 F (37.2 C)  SpO2: 98% 98%    General: Lying in bed, no acute distress Respiratory: No increased work of breathing.  Left upper extremity: Sling in place.  Dressing is clean, dry, intact.  Mild tenderness with palpation over the shoulder.  Nontender in the elbow or forearm.  Full wrist motion.  Able to flex and extend the elbow.  Sensation and motor function intact through median, ulnar, radial nerve distributions.  Extremity warm and well-perfused.  2+ radial pulse Left lower extremity: Dressings are clean, dry, intact.  Tenderness with palpation over the hip but otherwise nontender throughout the extremity.  Compartments are soft and compressible.  Ankle dorsiflexion/plantarflexion is intact.  Sensation intact to light touch distally.  2+ DP pulse  Imaging: Stable post op imaging.   Labs:  Results for orders placed or performed during the hospital encounter of 03/12/20 (from the past 24 hour(s))  CBG monitoring, ED     Status: Abnormal   Collection Time: 03/12/20 11:37 AM  Result Value Ref Range   Glucose-Capillary 165 (H) 70 - 99 mg/dL  Basic metabolic panel     Status: Abnormal   Collection Time: 03/12/20 12:10 PM  Result Value Ref Range   Sodium 134 (L) 135 - 145 mmol/L   Potassium 3.9 3.5 - 5.1 mmol/L   Chloride 102 98 - 111 mmol/L   CO2 24 22 - 32 mmol/L   Glucose, Bld 185 (H) 70 - 99 mg/dL   BUN 14 8 - 23 mg/dL   Creatinine, Ser 0.64 0.44 - 1.00 mg/dL   Calcium 9.2 8.9 - 10.3 mg/dL   GFR calc non Af Amer >60 >60 mL/min   GFR calc Af Amer >60 >60 mL/min   Anion gap 8 5 - 15  CBC  WITH DIFFERENTIAL     Status: Abnormal   Collection Time: 03/12/20 12:10 PM  Result Value Ref Range   WBC 9.9 4.0 - 10.5 K/uL   RBC 3.75 (L) 3.87 - 5.11 MIL/uL   Hemoglobin 11.4 (L) 12.0 - 15.0 g/dL   HCT 34.9 (L) 36.0 - 46.0 %   MCV 93.1 80.0 - 100.0 fL   MCH 30.4 26.0 - 34.0 pg   MCHC 32.7 30.0 - 36.0 g/dL   RDW 13.2 11.5 - 15.5 %   Platelets 258 150 - 400 K/uL   nRBC 0.0 0.0 - 0.2 %   Neutrophils Relative % 76 %   Neutro Abs 7.4 1.7 - 7.7 K/uL   Lymphocytes Relative 14 %   Lymphs Abs 1.4 0.7 - 4.0 K/uL   Monocytes Relative 7 %   Monocytes Absolute 0.7 0.1 - 1.0 K/uL   Eosinophils Relative 2 %   Eosinophils Absolute 0.2 0.0 - 0.5 K/uL   Basophils Relative 0 %   Basophils Absolute 0.0 0.0 - 0.1 K/uL   Immature Granulocytes 1 %   Abs Immature Granulocytes 0.05 0.00 - 0.07 K/uL  Type and screen [place]     Status: None   Collection Time: 03/12/20 12:10 PM  Result  Value Ref Range   ABO/RH(D) A POS    Antibody Screen NEG    Sample Expiration      03/15/2020,2359 Performed at [facility], [address] [place], [place] [address]   ABO/Rh     Status: None   Collection Time: 03/12/20 12:10 PM  Result Value Ref Range   ABO/RH(D)      A POS Performed at [facility], [address], [place], [place] [address]   Respiratory Panel by RT PCR (Flu A&B, Covid) - Nasopharyngeal Swab     Status: None   Collection Time: 03/12/20 12:16 PM   Specimen: Nasopharyngeal Swab  Result Value Ref Range   SARS Coronavirus 2 by RT PCR NEGATIVE NEGATIVE   Influenza A by PCR NEGATIVE NEGATIVE   Influenza B by PCR NEGATIVE NEGATIVE  CBC     Status: Abnormal   Collection Time: 03/13/20  3:36 AM  Result Value Ref Range   WBC 9.0 4.0 - 10.5 K/uL   RBC 2.68 (L) 3.87 - 5.11 MIL/uL   Hemoglobin 8.1 (L) 12.0 - 15.0 g/dL   HCT 51.7 (L) 61.6 - 07.3 %   MCV 91.0 80.0 - 100.0 fL   MCH 30.2 26.0 - 34.0 pg   MCHC 33.2 30.0 - 36.0 g/dL   RDW 71.0 62.6 - 94.8 %    Platelets 207 150 - 400 K/uL   nRBC 0.0 0.0 - 0.2 %    Assessment: 86 year old female status post fall, 1 Day Post-Op   Injuries: 1.  Left proximal humerus fracture s/p ORIF 2.  Left intertrochanteric femur fracture s/p intramedullary nailing  Weightbearing: WBAT LLE, NWB LUE  Insicional and dressing care: We will plan to remove dressings tomorrow and leave incisions open to air if there is no drainage  Showering: Okay to begin showering on 03/15/2020  Orthopedic device(s): Sling for comfort LUE   Okay for unrestricted passive and active range of motion of the left shoulder.  CV/Blood loss: Acute blood loss anemia, Hgb 8.4 this morning. Hemodynamically stable  Pain management:  1. Tylenol 325-650 mg q 6 hours PRN 2. Robaxin 500 mg q 6 hours PRN 3. Norco 5-325 Q 6 hours PRN 4. Dilaudid 0.5 mg q 3 hours PRN  VTE prophylaxis: SCDs for now.  Hold Lovenox today, recheck CBC tomorrow   ID:  Ancef 2gm post op  Foley/Lines: No foley, KVO IVFs  Medical co-morbidities: Severe aortic stenosis, hypertension, hyperlipidemia  Impediments to Fracture Healing: Polytrauma.  Vitamin D level pending, start supplementation as indicated  Dispo: PT/OT evaluation today.  Will likely need SNF.  Hold Lovenox today.  Recheck CBC tomorrow  Have signed and placed discharge Rx for pain medication and DVT prophylaxis in patient's chart  Follow - up plan: 2 weeks for repeat x-rays of left shoulder and left hip  Contact information:  Truitt Merle MD, Ulyses Southward PA-C   Jalysa Swopes A. [facility] [phone] (office) [email or web address]

## 2020-03-13 NOTE — Evaluation (Signed)
Physical Therapy Evaluation Patient Details Name: JARELIS EHLERT MRN: [id number] DOB: [birth year] Today's Date: 03/13/2020   History of Present Illness  ELLI GROESBECK is a 86 y.o. female with medical history significant of HTN, HLD, presented with mechanical fall this morning. s/p left femur IM nail and ORIF left proximal humerus.  Clinical Impression  Patient received in bed, agrees to PT/OT evaluation. She is HOH. Reports no pain at rest, but pain with movement of L UE or LE. She requires mod assist +2 for supine to sit on side of the bed. Once positioned in sitting, she is able to maintain sitting independently. She was able to transfer sit to stand with mod +2 assist and take a few steps from the bed to recliner. Mod assist on her right side for support.  She will benefit from continued skilled PT while here to improve strength, functional mobility and safety. Patient will need education regarding appropriate ad use and safety.           Follow Up Recommendations SNF    Equipment Recommendations  Cane;Other (comment)(or hemiwalker)    Recommendations for Other Services       Precautions / Restrictions Precautions Precautions: Fall Required Braces or Orthoses: Sling Restrictions Weight Bearing Restrictions: Yes LUE Weight Bearing: Non weight bearing LLE Weight Bearing: Weight bearing as tolerated      Mobility  Bed Mobility Overal bed mobility: Needs Assistance Bed Mobility: Supine to Sit     Supine to sit: Mod assist;+2 for physical assistance        Transfers Overall transfer level: Needs assistance Equipment used: 1 person hand held assist Transfers: Sit to/from Stand Sit to Stand: Mod assist;+2 physical assistance            Ambulation/Gait Ambulation/Gait assistance: Mod assist;+2 physical assistance Gait Distance (Feet): 3 Feet Assistive device: 2 person hand held assist Gait Pattern/deviations: Step-to pattern;Decreased step length - right;Decreased  step length - left;Decreased stride length;Trunk flexed Gait velocity: decreased   General Gait Details: difficulty with ambulation due to Left LE pain with weight bearing  Stairs            Wheelchair Mobility    Modified Rankin (Stroke Patients Only)       Balance Overall balance assessment: Needs assistance Sitting-balance support: Feet supported Sitting balance-Leahy Scale: Fair     Standing balance support: Single extremity supported;During functional activity Standing balance-Leahy Scale: Fair                               Pertinent Vitals/Pain Pain Assessment: Faces Faces Pain Scale: Hurts even more Pain Location: L UE and L LE with movement, exercise Pain Descriptors / Indicators: Discomfort;Grimacing;Guarding;Sore Pain Intervention(s): Monitored during session;Repositioned    Home Living Family/patient expects to be discharged to:: Skilled nursing facility Living Arrangements: Spouse/significant other Available Help at Discharge: Family;Available 24 hours/day Type of Home: House Home Access: Level entry     Home Layout: Multi-level Home Equipment: Clinical cytogeneticist - 2 wheels      Prior Function Level of Independence: Independent         Comments: reports she did not use AD prior to fall     Hand Dominance        Extremity/Trunk Assessment   Upper Extremity Assessment Upper Extremity Assessment: Defer to OT evaluation    Lower Extremity Assessment Lower Extremity Assessment: Generalized weakness;LLE deficits/detail LLE Sensation: WNL LLE Coordination: decreased gross motor  Cervical / Trunk Assessment Cervical / Trunk Assessment: Kyphotic  Communication   Communication: HOH  Cognition Arousal/Alertness: Awake/alert Behavior During Therapy: WFL for tasks assessed/performed Overall Cognitive Status: Within Functional Limits for tasks assessed                                        General  Comments      Exercises Other Exercises Other Exercises: seated LAQ, long sitting ap, heel slides, hip abd/add x 10 reps each with min assist.   Assessment/Plan    PT Assessment Patient needs continued PT services  PT Problem List Decreased strength;Decreased mobility;Decreased activity tolerance;Decreased balance;Pain;Decreased knowledge of use of DME;Decreased knowledge of precautions       PT Treatment Interventions DME instruction;Therapeutic exercise;Gait training;Functional mobility training;Therapeutic activities;Patient/family education;Stair training    PT Goals (Current goals can be found in the Care Plan section)  Acute Rehab PT Goals Patient Stated Goal: to improve mobility PT Goal Formulation: With patient Time For Goal Achievement: 03/27/20 Potential to Achieve Goals: Good    Frequency Min 3X/week   Barriers to discharge        Co-evaluation PT/OT/SLP Co-Evaluation/Treatment: Yes Reason for Co-Treatment: For patient/therapist safety;To address functional/ADL transfers PT goals addressed during session: Mobility/safety with mobility;Balance         AM-PAC PT "6 Clicks" Mobility  Outcome Measure Help needed turning from your back to your side while in a flat bed without using bedrails?: A Lot Help needed moving from lying on your back to sitting on the side of a flat bed without using bedrails?: A Lot Help needed moving to and from a bed to a chair (including a wheelchair)?: A Lot Help needed standing up from a chair using your arms (e.g., wheelchair or bedside chair)?: A Lot Help needed to walk in hospital room?: A Lot Help needed climbing 3-5 steps with a railing? : A Lot 6 Click Score: 12    End of Session Equipment Utilized During Treatment: Gait belt;Other (comment)(sling on L UE) Activity Tolerance: Patient tolerated treatment well;Patient limited by pain Patient left: in chair;with call bell/phone within reach;with chair alarm set Nurse  Communication: Mobility status;Weight bearing status PT Visit Diagnosis: Unsteadiness on feet (R26.81);Other abnormalities of gait and mobility (R26.89);Muscle weakness (generalized) (M62.81);History of falling (Z91.81);Difficulty in walking, not elsewhere classified (R26.2);Pain Pain - Right/Left: Left Pain - part of body: Shoulder;Leg;Hip    Time: 0865-7846 PT Time Calculation (min) (ACUTE ONLY): 23 min   Charges:   PT Evaluation $PT Eval Moderate Complexity: 1 Mod PT Treatments $Gait Training: 8-22 mins        Sanaz Scarlett, PT, GCS 03/13/20,11:51 AM

## 2020-03-13 NOTE — NC FL2 (Signed)
[place] MEDICAID FL2 LEVEL OF CARE SCREENING TOOL     IDENTIFICATION  Patient Name: Anna Ewing Birthdate: [birth year] Sex: female Admission Date (Current Location): 03/12/2020  [facility] and [place] Number:  Producer, television/film/video and Address:  [facility] [place], [address], [place], [place] [address]      Provider Number: [id number]  Attending Physician Name and Address:  Darlin Drop, DO  Relative Name and Phone Number:  KRYSTEENA STALKER (Spouse)[phone]    Current Level of Care: Hospital Recommended Level of Care: Skilled Nursing Facility Prior Approval Number:    Date Approved/Denied:   PASRR Number: [id number]  Discharge Plan: SNF    Current Diagnoses: Patient Active Problem List   Diagnosis Date Noted  . Hip fracture, unspecified laterality, closed, initial encounter (HCC) 03/12/2020  . Humerus head fracture, left, closed, initial encounter 03/12/2020  . Hip fracture requiring operative repair, left, closed, initial encounter (HCC) 03/12/2020  . Hyponatremia 08/25/2018  . Impaired glucose tolerance 02/11/2017  . Weight loss 02/11/2017  . History of migraine headaches 02/11/2017  . Hearing loss 01/30/2013  . Osteoporosis 09/22/2011  . Irritable bowel syndrome 09/22/2011  . Allergic rhinitis due to pollen 01/22/2011  . HYPERLIPIDEMIA 02/06/2010  . HYPERTENSION 02/06/2010  . RHINITIS, CHRONIC 02/06/2010    Orientation RESPIRATION BLADDER Height & Weight     Self, Time, Situation, Place  Normal Continent Weight: 49.9 kg Height:  5\' 2"  (157.5 cm)  BEHAVIORAL SYMPTOMS/MOOD NEUROLOGICAL BOWEL NUTRITION STATUS      Continent Diet(Refer to d/c summary)  AMBULATORY STATUS COMMUNICATION OF NEEDS Skin   Extensive Assist Verbally Normal, Other (Comment)(s/p left femur IM nail and ORIF left proximal humerus., 03/12/2020)                       Personal Care Assistance Level of Assistance  Bathing, Feeding, Dressing Bathing Assistance:  Maximum assistance Feeding assistance: Limited assistance Dressing Assistance: Maximum assistance     Functional Limitations Info  Sight, Hearing, Speech Sight Info: Adequate(HOH) Hearing Info: Impaired Speech Info: Adequate    SPECIAL CARE FACTORS FREQUENCY  PT (By licensed PT), OT (By licensed OT)     PT Frequency: 5x/ week evaluate and treat OT Frequency: 5x/ week evaluate and treat            Contractures Contractures Info: Not present    Additional Factors Info  Code Status, Allergies Code Status Info: Full Code Allergies Info: Amoxicillin           Current Medications (03/13/2020):  This is the current hospital active medication list Current Facility-Administered Medications  Medication Dose Route Frequency Provider Last Rate Last Admin  . acetaminophen (TYLENOL) tablet 325-650 mg  325-650 mg Oral Q6H PRN 03/15/2020, PA-C      . amLODipine (NORVASC) tablet 5 mg  5 mg Oral Daily Despina Hidden A, PA-C   5 mg at 03/13/20 0916  . ceFAZolin (ANCEF) IVPB 2g/100 mL premix  2 g Intravenous Q8H 03/15/20 A, PA-C 200 mL/hr at 03/13/20 0915 2 g at 03/13/20 0915  . feeding supplement (ENSURE ENLIVE) (ENSURE ENLIVE) liquid 237 mL  237 mL Oral BID BM Hall, Carole N, DO   237 mL at 03/13/20 1413  . fluticasone (FLONASE) 50 MCG/ACT nasal spray 1 spray  1 spray Each Nare Daily PRN 03/15/20, PA-C      . HYDROcodone-acetaminophen (NORCO/VICODIN) 5-325 MG per tablet 1-2 tablet  1-2 tablet Oral  Q6H PRN Delray Alt, PA-C   2 tablet at 03/13/20 0123  . HYDROmorphone (DILAUDID) injection 0.5 mg  0.5 mg Intravenous Q3H PRN Delray Alt, PA-C   0.5 mg at 03/12/20 2141  . methocarbamol (ROBAXIN) tablet 500 mg  500 mg Oral Q6H PRN Delray Alt, PA-C      . rosuvastatin (CRESTOR) tablet 5 mg  5 mg Oral Daily Patrecia Pace A, PA-C   5 mg at 03/13/20 0916  . sodium chloride (OCEAN) 0.65 % nasal spray 1 spray  1 spray Nasal PRN Delray Alt, PA-C      . SUMAtriptan  (IMITREX) tablet 25 mg  25 mg Oral Q2H PRN Delray Alt, PA-C         Discharge Medications: Please see discharge summary for a list of discharge medications.  Relevant Imaging Results:  Relevant Lab Results:   Additional Information SS# [id number]  Sharin Mons, RN

## 2020-03-13 NOTE — Progress Notes (Signed)
PROGRESS NOTE  Anna Ewing FYB:[id number] DOB: Apr 07, 1929 DOA: 03/12/2020 PCP: Margaree Mackintosh, MD  HPI/Recap of past 24 hours:  HPI: Anna Ewing is a 86 y.o. female with medical history significant of HTN, HLD, presented with mechanical fall this morning. Patient was standing in the kitchen when she turned and then fell to the ground. She landed on her left hip and left shoulder. She is complaining of 10 out of 10 pain in those areas. She denies hitting her head and there is no loss of consciousness. She denied any light headedness, palpitation, blurry vision, any limb weakness or numbness before or after she fell.  ED Course: Xray shows Comminuted intertrochanteric LEFT femur fracture with varus Angulation; Displaced and impacted humeral head and neck fractures  03/13/20: Seen and examined.  POD #1 post left hip and left shoulder repair.  She is mainly concerned about pain control.  He has no new complaints.  Assessment/Plan: Active Problems:   Hip fracture, unspecified laterality, closed, initial encounter (HCC)   Humerus head fracture, left, closed, initial encounter   Hip fracture requiring operative repair, left, closed, initial encounter (HCC)   Left femoral neck fracture with varus angulation/Left Humeral neck head fracture post left hip and left shoulder repair on 03/12/2020 by Dr. Jena Gauss Continue pain control PT recommended SNF TOC assisting with placement DVT per orthopedic surgery recommendation PT OT per orthopedic surgery recommendation  HTN Blood pressure is at goal Continue home meds Continue Norvasc 5 mg daily  HLD Continue Crestor  Prolonged QTC Repeat EKG, avoid Tramadol Repeated EKG QTC 441 from greater than 500    DVT prophylaxis: Lovenox Code Status: Full code Family Communication: Son over the phone  disposition Plan: Probable need rehab Consults called: Orthopedic surgeon Admission status: MedSurg  admission      Objective: Vitals:   03/12/20 2025 03/12/20 2050 03/13/20 0000 03/13/20 0809  BP: (!) 155/76 (!) 146/77 126/69 139/70  Pulse: (!) 103 98 86 90  Resp: (!) 30 16 15 14   Temp: 97.8 F (36.6 C) 98.2 F (36.8 C) 98.5 F (36.9 C) 98.9 F (37.2 C)  TempSrc:  Axillary Oral Oral  SpO2: 100% 96% 98% 98%  Weight:      Height:        Intake/Output Summary (Last 24 hours) at 03/13/2020 1539 Last data filed at 03/13/2020 0920 Gross per 24 hour  Intake 1691.25 ml  Output 705 ml  Net 986.25 ml   Filed Weights   03/12/20 1306  Weight: 49.9 kg    Exam:  . General: 86 y.o. year-old female well developed well nourished in no acute distress.  Alert and very hard of hearing. . Cardiovascular: Regular rate and rhythm with no rubs or gallops.   92 Respiratory: Clear to auscultation with no wheezes or rales. Good inspiratory effort. . Abdomen: Soft nontender nondistended with normal bowel sounds x4 quadrants. . Musculoskeletal: No lower extremity edema bilaterally . Psychiatry: Mood is appropriate for condition and setting   Data Reviewed: CBC: Recent Labs  Lab 03/12/20 1210 03/13/20 0336  WBC 9.9 9.0  NEUTROABS 7.4  --   HGB 11.4* 8.1*  HCT 34.9* 24.4*  MCV 93.1 91.0  PLT 258 207   Basic Metabolic Panel: Recent Labs  Lab 03/12/20 1210  NA 134*  K 3.9  CL 102  CO2 24  GLUCOSE 185*  BUN 14  CREATININE 0.64  CALCIUM 9.2   GFR: Estimated Creatinine Clearance: 37.6 mL/min (by C-G formula based on  SCr of 0.64 mg/dL). Liver Function Tests: No results for input(s): AST, ALT, ALKPHOS, BILITOT, PROT, ALBUMIN in the last 168 hours. No results for input(s): LIPASE, AMYLASE in the last 168 hours. No results for input(s): AMMONIA in the last 168 hours. Coagulation Profile: No results for input(s): INR, PROTIME in the last 168 hours. Cardiac Enzymes: No results for input(s): CKTOTAL, CKMB, CKMBINDEX, TROPONINI in the last 168 hours. BNP (last 3 results) No  results for input(s): PROBNP in the last 8760 hours. HbA1C: No results for input(s): HGBA1C in the last 72 hours. CBG: Recent Labs  Lab 03/12/20 1137  GLUCAP 165*   Lipid Profile: No results for input(s): CHOL, HDL, LDLCALC, TRIG, CHOLHDL, LDLDIRECT in the last 72 hours. Thyroid Function Tests: No results for input(s): TSH, T4TOTAL, FREET4, T3FREE, THYROIDAB in the last 72 hours. Anemia Panel: No results for input(s): VITAMINB12, FOLATE, FERRITIN, TIBC, IRON, RETICCTPCT in the last 72 hours. Urine analysis:    Component Value Date/Time   BILIRUBINUR NEG 05/31/2019 1131   PROTEINUR Negative 05/31/2019 1131   UROBILINOGEN 0.2 05/31/2019 1131   NITRITE NEG 05/31/2019 1131   LEUKOCYTESUR Negative 05/31/2019 1131   Sepsis Labs: @LABRCNTIP (procalcitonin:4,lacticidven:4)  ) Recent Results (from the past 240 hour(s))  Respiratory Panel by RT PCR (Flu A&B, Covid) - Nasopharyngeal Swab     Status: None   Collection Time: 03/12/20 12:16 PM   Specimen: Nasopharyngeal Swab  Result Value Ref Range Status   SARS Coronavirus 2 by RT PCR NEGATIVE NEGATIVE Final    Comment: (NOTE) SARS-CoV-2 target nucleic acids are NOT DETECTED. The SARS-CoV-2 RNA is generally detectable in upper respiratoy specimens during the acute phase of infection. The lowest concentration of SARS-CoV-2 viral copies this assay can detect is 131 copies/mL. A negative result does not preclude SARS-Cov-2 infection and should not be used as the sole basis for treatment or other patient management decisions. A negative result may occur with  improper specimen collection/handling, submission of specimen other than nasopharyngeal swab, presence of viral mutation(s) within the areas targeted by this assay, and inadequate number of viral copies (<131 copies/mL). A negative result must be combined with clinical observations, patient history, and epidemiological information. The expected result is Negative. Fact Sheet for  Patients:  03/14/20 Fact Sheet for Healthcare Providers:  https://www.moore.com/ This test is not yet ap proved or cleared by the https://www.young.biz/ FDA and  has been authorized for detection and/or diagnosis of SARS-CoV-2 by FDA under an Emergency Use Authorization (EUA). This EUA will remain  in effect (meaning this test can be used) for the duration of the COVID-19 declaration under Section 564(b)(1) of the Act, 21 U.S.C. section 360bbb-3(b)(1), unless the authorization is terminated or revoked sooner.    Influenza A by PCR NEGATIVE NEGATIVE Final   Influenza B by PCR NEGATIVE NEGATIVE Final    Comment: (NOTE) The Xpert Xpress SARS-CoV-2/FLU/RSV assay is intended as an aid in  the diagnosis of influenza from Nasopharyngeal swab specimens and  should not be used as a sole basis for treatment. Nasal washings and  aspirates are unacceptable for Xpert Xpress SARS-CoV-2/FLU/RSV  testing. Fact Sheet for Patients: [place] Fact Sheet for Healthcare Providers: https://www.moore.com/ This test is not yet approved or cleared by the https://www.young.biz/ FDA and  has been authorized for detection and/or diagnosis of SARS-CoV-2 by  FDA under an Emergency Use Authorization (EUA). This EUA will remain  in effect (meaning this test can be used) for the duration of the  Covid-19 declaration under  Section 564(b)(1) of the Act, 21  U.S.C. section 360bbb-3(b)(1), unless the authorization is  terminated or revoked. Performed at [facility], [place] [address], [place], [place] [address]       Studies: DG Shoulder Left Port  Result Date: 03/12/2020 CLINICAL DATA:  Left shoulder fracture repair EXAM: LEFT SHOULDER COMPARISON:  03/12/2020 FINDINGS: Frontal and transscapular views of the left shoulder demonstrate plate and screw fixation across the humeral neck fracture with anatomic alignment. No  dislocation. Stable degenerative changes of the acromioclavicular joint. IMPRESSION: 1. ORIF proximal left humerus. Electronically Signed   By: Randa Ngo M.D.   On: 03/12/2020 20:52   DG Humerus Left  Result Date: 03/12/2020 CLINICAL DATA:  Open reduction and internal fixation of the proximal left humerus EXAM: LEFT HUMERUS - 2+ VIEW COMPARISON:  None. FINDINGS: A radiopaque fixation plate and multiple fixation screws are seen overlying the left humeral head and proximal left humeral shaft. Acute fracture deformity is seen along the surgical neck of the proximal left humerus with anatomic alignment. There is no evidence of dislocation. Soft tissues are unremarkable. IMPRESSION: Open reduction and internal fixation of the proximal left humerus with anatomic alignment. Electronically Signed   By: Virgina Norfolk M.D.   On: 03/12/2020 19:43   DG C-Arm 1-60 Min  Result Date: 03/12/2020 CLINICAL DATA:  Left femoral IM nail EXAM: LEFT FEMUR 2 VIEWS; DG C-ARM 1-60 MIN COMPARISON:  03/12/2020 FINDINGS: Eight fluoroscopic images are obtained during the performance of procedure and are provided for interpretation only. Images demonstrate intramedullary rod with proximal dynamic screw and distal interlocking screw traversing the intertrochanteric left hip fracture seen previously. Alignment is near anatomic. FLUOROSCOPY TIME:  2 minutes 47 seconds IMPRESSION: 1. ORIF left hip as above. Electronically Signed   By: Randa Ngo M.D.   On: 03/12/2020 19:50   DG HIP PORT UNILAT W OR W/O PELVIS 1V LEFT  Result Date: 03/12/2020 CLINICAL DATA:  Postop left hip repair, intertrochanteric fracture EXAM: DG HIP (WITH OR WITHOUT PELVIS) 1V PORT LEFT COMPARISON:  03/12/2020 FINDINGS: Frontal view of the pelvis and cross-table lateral view of the left hip are obtained. Intramedullary rod with proximal dynamic screws and distal interlocking screw traverse the inter trochanteric left hip fracture seen previously.  Alignment is anatomic. Stable degenerative changes right hip. IMPRESSION: 1. ORIF left hip as above. Electronically Signed   By: Randa Ngo M.D.   On: 03/12/2020 20:43   DG FEMUR MIN 2 VIEWS LEFT  Result Date: 03/12/2020 CLINICAL DATA:  Left femoral IM nail EXAM: LEFT FEMUR 2 VIEWS; DG C-ARM 1-60 MIN COMPARISON:  03/12/2020 FINDINGS: Eight fluoroscopic images are obtained during the performance of procedure and are provided for interpretation only. Images demonstrate intramedullary rod with proximal dynamic screw and distal interlocking screw traversing the intertrochanteric left hip fracture seen previously. Alignment is near anatomic. FLUOROSCOPY TIME:  2 minutes 47 seconds IMPRESSION: 1. ORIF left hip as above. Electronically Signed   By: Randa Ngo M.D.   On: 03/12/2020 19:50    Scheduled Meds: . amLODipine  5 mg Oral Daily  . feeding supplement (ENSURE ENLIVE)  237 mL Oral BID BM  . rosuvastatin  5 mg Oral Daily    Continuous Infusions: .  ceFAZolin (ANCEF) IV 2 g (03/13/20 0915)     LOS: 1 day     Kayleen Memos, MD [facility] Pager [phone]  If 7PM-7AM, please contact night-coverage [email or web address] Password [email or web address] 03/13/2020, 3:39 PM

## 2020-03-14 LAB — CBC
HCT: 20.9 % — ABNORMAL LOW (ref 36.0–46.0)
Hemoglobin: 6.9 g/dL — CL (ref 12.0–15.0)
MCH: 30.7 pg (ref 26.0–34.0)
MCHC: 33 g/dL (ref 30.0–36.0)
MCV: 92.9 fL (ref 80.0–100.0)
Platelets: 172 10*3/uL (ref 150–400)
RBC: 2.25 MIL/uL — ABNORMAL LOW (ref 3.87–5.11)
RDW: 13.3 % (ref 11.5–15.5)
WBC: 10.5 10*3/uL (ref 4.0–10.5)
nRBC: 0 % (ref 0.0–0.2)

## 2020-03-14 LAB — BASIC METABOLIC PANEL
Anion gap: 7 (ref 5–15)
BUN: 14 mg/dL (ref 8–23)
CO2: 23 mmol/L (ref 22–32)
Calcium: 8.5 mg/dL — ABNORMAL LOW (ref 8.9–10.3)
Chloride: 100 mmol/L (ref 98–111)
Creatinine, Ser: 0.53 mg/dL (ref 0.44–1.00)
GFR calc Af Amer: >60 mL/min (ref >60)
GFR calc non Af Amer: >60 mL/min (ref >60)
Glucose, Bld: 142 mg/dL — ABNORMAL HIGH (ref 70–99)
Potassium: 4.5 mmol/L (ref 3.5–5.1)
Sodium: 130 mmol/L — ABNORMAL LOW (ref 135–145)

## 2020-03-14 LAB — HEMOGLOBIN AND HEMATOCRIT, BLOOD
HCT: 20.2 % — ABNORMAL LOW (ref 36.0–46.0)
Hemoglobin: 6.6 g/dL — CL (ref 12.0–15.0)

## 2020-03-14 LAB — PREPARE RBC (CROSSMATCH)

## 2020-03-14 MED ORDER — VITAMIN D 125 MCG (5000 UT) PO CAPS: 5000.0000 [IU] | ORAL_CAPSULE | Freq: Every day | ORAL | 1 refills | Status: DC

## 2020-03-14 MED ORDER — VITAMIN D 25 MCG (1000 UNIT) PO TABS
2000.0000 [IU] | ORAL_TABLET | Freq: Two times a day (BID) | ORAL | Status: DC
  Administered 2020-03-14: 2000 [IU] via ORAL
  Filled 2020-03-14: qty 2

## 2020-03-14 MED ORDER — VITAMIN D 25 MCG (1000 UNIT) PO TABS
2000.0000 [IU] | ORAL_TABLET | Freq: Every day | ORAL | Status: DC
  Administered 2020-03-15 – 2020-03-16 (×2): 2000 [IU] via ORAL
  Filled 2020-03-14 (×2): qty 2

## 2020-03-14 MED ORDER — SODIUM CHLORIDE 0.9% IV SOLUTION: Freq: Once | INTRAVENOUS | Status: AC

## 2020-03-14 NOTE — Plan of Care (Signed)
  Problem: Education: Goal: Verbalization of understanding the information provided (i.e., activity precautions, restrictions, etc) will improve Outcome: Progressing   Problem: Activity: Goal: Ability to ambulate and perform ADLs will improve Outcome: Progressing   Problem: Self-Concept: Goal: Ability to maintain and perform role responsibilities to the fullest extent possible will improve Outcome: Progressing   Problem: Pain Management: Goal: Pain level will decrease Outcome: Progressing   Problem: Education: Goal: Knowledge of General Education information will improve Description: Including pain rating scale, medication(s)/side effects and non-pharmacologic comfort measures Outcome: Progressing   Problem: Health Behavior/Discharge Planning: Goal: Ability to manage health-related needs will improve Outcome: Progressing   Problem: Clinical Measurements: Goal: Ability to maintain clinical measurements within normal limits will improve Outcome: Progressing Goal: Will remain free from infection Outcome: Progressing Goal: Diagnostic test results will improve Outcome: Progressing Goal: Respiratory complications will improve Outcome: Progressing Goal: Cardiovascular complication will be avoided Outcome: Progressing   Problem: Activity: Goal: Risk for activity intolerance will decrease Outcome: Progressing   Problem: Nutrition: Goal: Adequate nutrition will be maintained Outcome: Progressing   Problem: Coping: Goal: Level of anxiety will decrease Outcome: Progressing   Problem: Elimination: Goal: Will not experience complications related to bowel motility Outcome: Progressing Goal: Will not experience complications related to urinary retention Outcome: Progressing   Problem: Pain Managment: Goal: General experience of comfort will improve Outcome: Progressing   Problem: Safety: Goal: Ability to remain free from injury will improve Outcome: Progressing    Problem: Skin Integrity: Goal: Risk for impaired skin integrity will decrease Outcome: Progressing   

## 2020-03-14 NOTE — Plan of Care (Signed)
  Problem: Education: Goal: Verbalization of understanding the information provided (i.e., activity precautions, restrictions, etc) will improve Outcome: Progressing   Problem: Activity: Goal: Ability to ambulate and perform ADLs will improve Outcome: Progressing   Problem: Self-Concept: Goal: Ability to maintain and perform role responsibilities to the fullest extent possible will improve Outcome: Progressing   Problem: Pain Management: Goal: Pain level will decrease Outcome: Progressing   Problem: Clinical Measurements: Goal: Will remain free from infection Outcome: Progressing   Problem: Activity: Goal: Risk for activity intolerance will decrease Outcome: Progressing   Problem: Pain Managment: Goal: General experience of comfort will improve Outcome: Progressing   Problem: Safety: Goal: Ability to remain free from injury will improve Outcome: Progressing   Problem: Skin Integrity: Goal: Risk for impaired skin integrity will decrease Outcome: Progressing

## 2020-03-14 NOTE — Progress Notes (Signed)
PROGRESS NOTE  Anna Ewing BOF:[id number] DOB: Apr 25, 1929 DOA: 03/12/2020 PCP: Margaree Mackintosh, MD  HPI/Recap of past 24 hours:  HPI: Anna Ewing is a 86 y.o. female with medical history significant of HTN, HLD, presented with mechanical fall this morning. Patient was standing in the kitchen when she turned and then fell to the ground. She landed on her left hip and left shoulder. She is complaining of 10 out of 10 pain in those areas. She denies hitting her head and there is no loss of consciousness. She denied any light headedness, palpitation, blurry vision, any limb weakness or numbness before or after she fell.  ED Course: Xray shows Comminuted intertrochanteric LEFT femur fracture with varus Angulation; Displaced and impacted humeral head and neck fractures   03/14/20:  POD#2 post left shoulder and left hip repair.  Seen and examined.   No acute events overnight.   No new complaints.  Reports pain in her left hip when she moves.  Drop in Hg this AM down to 6.6.  Ordered 2 U PRBCs to be transfused.    Assessment/Plan: Active Problems:   Hip fracture, unspecified laterality, closed, initial encounter (HCC)   Humerus head fracture, left, closed, initial encounter   Hip fracture requiring operative repair, left, closed, initial encounter (HCC)   Left femoral neck fracture with varus angulation/Left Humeral neck head fracture post left hip and left shoulder repair on 03/12/2020 by Dr. Jena Gauss Continue pain control PT recommended SNF TOC assisting with placement DVT per orthopedic surgery recommendation PT OT per orthopedic surgery recommendation  Acute blood loss anemia post surgery Hg drop from 11 to 6.6 Transfuse 2U PRBCs Monitor H&H  HTN Blood pressure is at goal Continue home meds Continue Norvasc 5 mg daily  HLD Continue Crestor  Prolonged QTC Repeat EKG, avoid Tramadol Repeated EKG QTC 441 from greater than 500    DVT prophylaxis: SCDs in the  setting of acute blood loss. Code Status: Full code Family Communication: will call family  Disposition Plan: patient is from home.  Anticipate dc to SNF once Hg is stable likely in the next 24-48 H. Consults called: Orthopedic surgeon       Objective: Vitals:   03/13/20 2008 03/13/20 2355 03/14/20 0300 03/14/20 0743  BP: 133/65 130/62 139/67 (!) 128/59  Pulse: 88 90 93 (!) 101  Resp: 17 15 18 14   Temp: 99.3 F (37.4 C) 99.1 F (37.3 C) 98.3 F (36.8 C) 99.8 F (37.7 C)  TempSrc: Oral Oral Oral Oral  SpO2: 96% 94% 93% 97%  Weight:      Height:        Intake/Output Summary (Last 24 hours) at 03/14/2020 1010 Last data filed at 03/14/2020 0700 Gross per 24 hour  Intake --  Output 850 ml  Net -850 ml   Filed Weights   03/12/20 1306  Weight: 49.9 kg    Exam:  . General: 86 y.o. year-old female Well developed well nourished in no acute distress . Cardiovascular: Regular rate and rhythm no rubs or gallops.   92 Respiratory: Clear to auscultation no wheezes or rales. . Abdomen: Soft nontender normal bowel sounds present  . musculoskeletal: No lower extremity edema bilaterally.  Left hip stitches appear clean with no drainage. Marland Kitchen Psychiatry: Mood is appropriate for condition and setting.   Data Reviewed: CBC: Recent Labs  Lab 03/12/20 1210 03/13/20 0336 03/14/20 0629 03/14/20 0904  WBC 9.9 9.0 10.5  --   NEUTROABS 7.4  --   --   --  HGB 11.4* 8.1* 6.9* 6.6*  HCT 34.9* 24.4* 20.9* 20.2*  MCV 93.1 91.0 92.9  --   PLT 258 207 172  --    Basic Metabolic Panel: Recent Labs  Lab 03/12/20 1210 03/14/20 0629  NA 134* 130*  K 3.9 4.5  CL 102 100  CO2 24 23  GLUCOSE 185* 142*  BUN 14 14  CREATININE 0.64 0.53  CALCIUM 9.2 8.5*   GFR: Estimated Creatinine Clearance: 37.6 mL/min (by C-G formula based on SCr of 0.53 mg/dL). Liver Function Tests: No results for input(s): AST, ALT, ALKPHOS, BILITOT, PROT, ALBUMIN in the last 168 hours. No results for input(s):  LIPASE, AMYLASE in the last 168 hours. No results for input(s): AMMONIA in the last 168 hours. Coagulation Profile: No results for input(s): INR, PROTIME in the last 168 hours. Cardiac Enzymes: No results for input(s): CKTOTAL, CKMB, CKMBINDEX, TROPONINI in the last 168 hours. BNP (last 3 results) No results for input(s): PROBNP in the last 8760 hours. HbA1C: No results for input(s): HGBA1C in the last 72 hours. CBG: Recent Labs  Lab 03/12/20 1137  GLUCAP 165*   Lipid Profile: No results for input(s): CHOL, HDL, LDLCALC, TRIG, CHOLHDL, LDLDIRECT in the last 72 hours. Thyroid Function Tests: No results for input(s): TSH, T4TOTAL, FREET4, T3FREE, THYROIDAB in the last 72 hours. Anemia Panel: No results for input(s): VITAMINB12, FOLATE, FERRITIN, TIBC, IRON, RETICCTPCT in the last 72 hours. Urine analysis:    Component Value Date/Time   BILIRUBINUR NEG 05/31/2019 1131   PROTEINUR Negative 05/31/2019 1131   UROBILINOGEN 0.2 05/31/2019 1131   NITRITE NEG 05/31/2019 1131   LEUKOCYTESUR Negative 05/31/2019 1131   Sepsis Labs: @LABRCNTIP (procalcitonin:4,lacticidven:4)  ) Recent Results (from the past 240 hour(s))  Respiratory Panel by RT PCR (Flu A&B, Covid) - Nasopharyngeal Swab     Status: None   Collection Time: 03/12/20 12:16 PM   Specimen: Nasopharyngeal Swab  Result Value Ref Range Status   SARS Coronavirus 2 by RT PCR NEGATIVE NEGATIVE Final    Comment: (NOTE) SARS-CoV-2 target nucleic acids are NOT DETECTED. The SARS-CoV-2 RNA is generally detectable in upper respiratoy specimens during the acute phase of infection. The lowest concentration of SARS-CoV-2 viral copies this assay can detect is 131 copies/mL. A negative result does not preclude SARS-Cov-2 infection and should not be used as the sole basis for treatment or other patient management decisions. A negative result may occur with  improper specimen collection/handling, submission of specimen other than  nasopharyngeal swab, presence of viral mutation(s) within the areas targeted by this assay, and inadequate number of viral copies (<131 copies/mL). A negative result must be combined with clinical observations, patient history, and epidemiological information. The expected result is Negative. Fact Sheet for Patients:  03/14/20 Fact Sheet for Healthcare Providers:  https://www.moore.com/ This test is not yet ap proved or cleared by the https://www.young.biz/ FDA and  has been authorized for detection and/or diagnosis of SARS-CoV-2 by FDA under an Emergency Use Authorization (EUA). This EUA will remain  in effect (meaning this test can be used) for the duration of the COVID-19 declaration under Section 564(b)(1) of the Act, 21 U.S.C. section 360bbb-3(b)(1), unless the authorization is terminated or revoked sooner.    Influenza A by PCR NEGATIVE NEGATIVE Final   Influenza B by PCR NEGATIVE NEGATIVE Final    Comment: (NOTE) The Xpert Xpress SARS-CoV-2/FLU/RSV assay is intended as an aid in  the diagnosis of influenza from Nasopharyngeal swab specimens and  should not be used  as a sole basis for treatment. Nasal washings and  aspirates are unacceptable for Xpert Xpress SARS-CoV-2/FLU/RSV  testing. Fact Sheet for Patients: PinkCheek.be Fact Sheet for Healthcare Providers: GravelBags.it This test is not yet approved or cleared by the [place] FDA and  has been authorized for detection and/or diagnosis of SARS-CoV-2 by  FDA under an Emergency Use Authorization (EUA). This EUA will remain  in effect (meaning this test can be used) for the duration of the  Covid-19 declaration under Section 564(b)(1) of the Act, 21  U.S.C. section 360bbb-3(b)(1), unless the authorization is  terminated or revoked. Performed at [facility], [place] [address] [place], [place] [address]        Studies: No results found.  Scheduled Meds: . sodium chloride   Intravenous Once  . amLODipine  5 mg Oral Daily  . cholecalciferol  2,000 Units Oral BID  . feeding supplement (ENSURE ENLIVE)  237 mL Oral BID BM  . rosuvastatin  5 mg Oral Daily    Continuous Infusions:    LOS: 2 days     Kayleen Memos, MD [facility] Pager [phone]  If 7PM-7AM, please contact night-coverage [email or web address] Password [email or web address] 03/14/2020, 10:10 AM

## 2020-03-14 NOTE — Progress Notes (Signed)
NCM received call from [place] [phone]) . SNF authorization received ID  # [id number], x 3 days. Next review 4/29. Fax # [phone]. CM Haydee Monica.  Gae Gallop RN,BSN,CM

## 2020-03-14 NOTE — Progress Notes (Signed)
Orthopaedic Trauma Progress Note  S: Doing okay this morning, having a difficult time eating her breakfast due to pain and limited motion in her left shoulder.  No issues with her left hip.  Does not remember working with physical or occupational therapy yesterday.  No questions or concerns currently.  O:  Vitals:   03/14/20 0300 03/14/20 0743  BP: 139/67 (!) 128/59  Pulse: 93 (!) 101  Resp: 18 14  Temp: 98.3 F (36.8 C) 99.8 F (37.7 C)  SpO2: 93% 97%    General: Sitting up in bed, no acute distress.  Eating breakfast Respiratory: No increased work of breathing.  Left upper extremity:  Dressing is clean, dry, intact.  Bruising noted throughout the upper arm.  Tenderness with palpation over the shoulder and through the upper arm.  Nontender in the elbow or forearm.  Has full elbow and wrist motion.  Tolerates very minimal active or passive forward elevation of the shoulder.  Sensation and motor function intact through median, ulnar, radial nerve distributions.  Extremity warm and well-perfused.  2+ radial pulse Left lower extremity: Dressings removed, incisions are clean, dry, intact with Dermabond in place.  Tenderness with palpation over the hip but otherwise nontender throughout the extremity.  Compartments are soft and compressible.  Ankle dorsiflexion/plantarflexion is intact.  Sensation intact to light touch distally.  2+ DP pulse  Imaging: Stable post op imaging.   Labs:  Results for orders placed or performed during the hospital encounter of 03/12/20 (from the past 24 hour(s))  CBC     Status: Abnormal   Collection Time: 03/14/20  6:29 AM  Result Value Ref Range   WBC 10.5 4.0 - 10.5 K/uL   RBC 2.25 (L) 3.87 - 5.11 MIL/uL   Hemoglobin 6.9 (LL) 12.0 - 15.0 g/dL   HCT 94.1 (L) 74.0 - 81.4 %   MCV 92.9 80.0 - 100.0 fL   MCH 30.7 26.0 - 34.0 pg   MCHC 33.0 30.0 - 36.0 g/dL   RDW 48.1 85.6 - 31.4 %   Platelets 172 150 - 400 K/uL   nRBC 0.0 0.0 - 0.2 %  Basic metabolic panel      Status: Abnormal   Collection Time: 03/14/20  6:29 AM  Result Value Ref Range   Sodium 130 (L) 135 - 145 mmol/L   Potassium 4.5 3.5 - 5.1 mmol/L   Chloride 100 98 - 111 mmol/L   CO2 23 22 - 32 mmol/L   Glucose, Bld 142 (H) 70 - 99 mg/dL   BUN 14 8 - 23 mg/dL   Creatinine, Ser 9.70 0.44 - 1.00 mg/dL   Calcium 8.5 (L) 8.9 - 10.3 mg/dL   GFR calc non Af Amer >60 >60 mL/min   GFR calc Af Amer >60 >60 mL/min   Anion gap 7 5 - 15    Assessment: 86 year old female status post fall, 2 Days Post-Op   Injuries: 1.  Left proximal humerus fracture s/p ORIF 2.  Left intertrochanteric femur fracture s/p intramedullary nailing  Weightbearing: WBAT LLE, NWB LUE  Insicional and dressing care: We will plan to remove dressings tomorrow and leave incisions open to air if there is no drainage  Showering: Okay to begin showering on 03/15/2020  Orthopedic device(s): Sling for comfort LUE   Okay for unrestricted passive and active range of motion of the left shoulder.  CV/Blood loss: Acute blood loss anemia, Hgb 6.9 this morning.  Hemoglobin and hematocrit being redrawn to verify values.  Will likely need  blood transfusion today.  Pain management:  1. Tylenol 325-650 mg q 6 hours PRN 2. Robaxin 500 mg q 6 hours PRN 3. Norco 5-325 q 6 hours PRN 4. Dilaudid 0.5 mg q 3 hours PRN  VTE prophylaxis: SCDs for now.  Hold Lovenox today, recheck CBC tomorrow   ID:  Ancef 2gm post op  Foley/Lines: No foley, KVO IVFs  Medical co-morbidities: Severe aortic stenosis, hypertension, hyperlipidemia  Impediments to Fracture Healing: Polytrauma.  Vitamin D level 23, start D3 supplementation.  Dispo: Up with therapies as tolerated.  PT/OT recommending SNF.  Okay for discharge from Ortho standpoint once hemoglobin stable and cleared from medicine team.  Hold Lovenox today.  Recheck CBC tomorrow  Have signed and placed discharge Rx for pain medication, Vit D supplementation, and DVT prophylaxis in patient's  chart  Follow - up plan: 2 weeks for repeat x-rays of left shoulder and left hip  Contact information:  Katha Hamming MD, Patrecia Pace PA-C   [facility]ess [facility] [phone] (office) [email or web address]

## 2020-03-15 LAB — CBC
HCT: 32 % — ABNORMAL LOW (ref 36.0–46.0)
Hemoglobin: 10.8 g/dL — ABNORMAL LOW (ref 12.0–15.0)
MCH: 30 pg (ref 26.0–34.0)
MCHC: 33.8 g/dL (ref 30.0–36.0)
MCV: 88.9 fL (ref 80.0–100.0)
Platelets: 168 10*3/uL (ref 150–400)
RBC: 3.6 MIL/uL — ABNORMAL LOW (ref 3.87–5.11)
RDW: 13.7 % (ref 11.5–15.5)
WBC: 10.5 10*3/uL (ref 4.0–10.5)
nRBC: 0 % (ref 0.0–0.2)

## 2020-03-15 LAB — BPAM RBC
Blood Product Expiration Date: 202105252359
Blood Product Expiration Date: 202105252359
ISSUE DATE / TIME: 202104271033
ISSUE DATE / TIME: 202104271356
Unit Type and Rh: 6200
Unit Type and Rh: 6200

## 2020-03-15 LAB — TYPE AND SCREEN
ABO/RH(D): A POS
Antibody Screen: NEGATIVE
Unit division: 0
Unit division: 0

## 2020-03-15 LAB — BASIC METABOLIC PANEL
Anion gap: 9 (ref 5–15)
BUN: 11 mg/dL (ref 8–23)
CO2: 24 mmol/L (ref 22–32)
Calcium: 8.6 mg/dL — ABNORMAL LOW (ref 8.9–10.3)
Chloride: 101 mmol/L (ref 98–111)
Creatinine, Ser: 0.46 mg/dL (ref 0.44–1.00)
GFR calc Af Amer: >60 mL/min (ref >60)
GFR calc non Af Amer: >60 mL/min (ref >60)
Glucose, Bld: 132 mg/dL — ABNORMAL HIGH (ref 70–99)
Potassium: 3.9 mmol/L (ref 3.5–5.1)
Sodium: 134 mmol/L — ABNORMAL LOW (ref 135–145)

## 2020-03-15 LAB — SARS CORONAVIRUS 2 (TAT 6-24 HRS): SARS Coronavirus 2: NEGATIVE

## 2020-03-15 MED ORDER — ENOXAPARIN SODIUM 40 MG/0.4ML ~~LOC~~ SOLN
40.0000 mg | SUBCUTANEOUS | Status: DC
  Administered 2020-03-16: 10:00:00 40 mg via SUBCUTANEOUS
  Filled 2020-03-15: qty 0.4

## 2020-03-15 NOTE — Plan of Care (Signed)
  Problem: Education: Goal: Verbalization of understanding the information provided (i.e., activity precautions, restrictions, etc) will improve Outcome: Progressing   Problem: Activity: Goal: Ability to ambulate and perform ADLs will improve Outcome: Progressing   Problem: Self-Concept: Goal: Ability to maintain and perform role responsibilities to the fullest extent possible will improve Outcome: Progressing   Problem: Pain Management: Goal: Pain level will decrease Outcome: Progressing   Problem: Health Behavior/Discharge Planning: Goal: Ability to manage health-related needs will improve Outcome: Progressing   Problem: Clinical Measurements: Goal: Ability to maintain clinical measurements within normal limits will improve Outcome: Progressing Goal: Will remain free from infection Outcome: Progressing   Problem: Skin Integrity: Goal: Risk for impaired skin integrity will decrease Outcome: Progressing

## 2020-03-15 NOTE — TOC Transition Note (Signed)
Transition of Care [facility]) - CM/SW Discharge Note   Patient Details  Name: Anna Ewing MRN: [id number] Date of Birth: 1929/09/12  Transition of Care [facility]) CM/SW Contact:  Epifanio Lesches, RN Phone Number: [phone] 03/15/2020, 12:19 PM   Clinical Narrative:     Patient will DC to: [facility] Anticipated DC date: 03/15/2020 Family notified: husband and son Transport by: Sharin Mons   Per MD patient ready for DC today [facility] . RN, patient, patient's family, and facility notified of DC. Discharge Summary and FL2 sent to facility. RN to call report prior to discharge [phone]. [phone]). Rm # [phone]. DC packet on chart. Ambulance transport requested for patient.   RNCM will sign off for now as intervention is no longer needed. Please consult [place] again if new needs arise.  Rodell Perna (Spouse) Vauda Salvucci (Son)    [phone]       Final next level of care: Skilled Nursing Facility([facility]) Barriers to Discharge: No Barriers Identified   Patient Goals and CMS Choice   CMS Medicare.gov Compare Post Acute Care list provided to:: Patient    Discharge Placement                       Discharge Plan and Services                                     Social Determinants of Health (SDOH) Interventions     Readmission Risk Interventions No flowsheet data found.

## 2020-03-15 NOTE — Progress Notes (Signed)
Physical Therapy Treatment Patient Details Name: Anna Ewing MRN: [id number] DOB: Dec 31, 1928 Today's Date: 03/15/2020    History of Present Illness Anna Ewing is a 86 y.o. female with medical history significant of HTN, HLD, presented with mechanical fall this morning. s/p left femur IM nail and ORIF left proximal humerus.    PT Comments    Pt progressing slowly towards her physical therapy goals. Attempted to progress ambulation today, but pt unable to tolerate more than taking pivotal steps to chair due to pain in LUE/LLE with movement. Requiring two person moderate assist for functional mobility. Worked on supine/seated exercises for strengthening. Will continue to benefit from SNF for further strengthening and transfer and gait training.     Follow Up Recommendations  SNF     Equipment Recommendations  Other (comment)(defer)    Recommendations for Other Services       Precautions / Restrictions Precautions Precautions: Fall Required Braces or Orthoses: Sling Restrictions Weight Bearing Restrictions: Yes LUE Weight Bearing: Non weight bearing LLE Weight Bearing: Weight bearing as tolerated Other Position/Activity Restrictions: per ortho note:allow unrestricted PROM and AROM as tolerated    Mobility  Bed Mobility Overal bed mobility: Needs Assistance Bed Mobility: Supine to Sit     Supine to sit: Mod assist;+2 for physical assistance     General bed mobility comments: ModA for trunk elevation and use of bed pad to scoot hips forward; limited by decreased functional use of LUE  Transfers Overall transfer level: Needs assistance Equipment used: Straight cane Transfers: Sit to/from [facility] Sit to Stand: Mod assist;+2 physical assistance Stand pivot transfers: Mod assist;+2 physical assistance       General transfer comment: ModA + 2 to stand and take pivotal steps from bed to recliner using SPC. Cues for sequencing, upright posture; pt  unable to maintain and quick descent into sitting in chair due to pain.  Ambulation/Gait                 Stairs             Wheelchair Mobility    Modified Rankin (Stroke Patients Only)       Balance Overall balance assessment: Needs assistance Sitting-balance support: Feet supported Sitting balance-Leahy Scale: Poor Sitting balance - Comments: reliant on single UE support on bed rails, therapist providing intermittent minA for stability   Standing balance support: Single extremity supported;During functional activity Standing balance-Leahy Scale: Poor Standing balance comment: reliant on single UE support with +2 therapist for support at gait belt during transfer                            Cognition Arousal/Alertness: Awake/alert Behavior During Therapy: WFL for tasks assessed/performed Overall Cognitive Status: Within Functional Limits for tasks assessed                                 General Comments: [facility]      Exercises General Exercises - Lower Extremity Quad Sets: Both;10 reps;Supine Long Arc Quad: Both;10 reps;Seated Heel Slides: AAROM;Left;10 reps;Supine Hip Flexion/Marching: Both;10 reps;Seated    General Comments        Pertinent Vitals/Pain Pain Assessment: Faces Faces Pain Scale: Hurts even more Pain Location: L UE and L LE with movement, exercise Pain Descriptors / Indicators: Discomfort;Grimacing;Guarding;Sore Pain Intervention(s): Limited activity within patient's tolerance;Monitored during session;Repositioned    Home Living  Prior Function            PT Goals (current goals can now be found in the care plan section) Acute Rehab PT Goals Patient Stated Goal: to improve mobility Potential to Achieve Goals: Good Progress towards PT goals: Progressing toward goals    Frequency    Min 3X/week      PT Plan Current plan remains appropriate    Co-evaluation               AM-PAC PT "6 Clicks" Mobility   Outcome Measure  Help needed turning from your back to your side while in a flat bed without using bedrails?: A Lot Help needed moving from lying on your back to sitting on the side of a flat bed without using bedrails?: A Lot Help needed moving to and from a bed to a chair (including a wheelchair)?: A Lot Help needed standing up from a chair using your arms (e.g., wheelchair or bedside chair)?: A Lot Help needed to walk in hospital room?: A Lot Help needed climbing 3-5 steps with a railing? : Total 6 Click Score: 11    End of Session Equipment Utilized During Treatment: Gait belt;Other (comment)(sling) Activity Tolerance: Patient tolerated treatment well Patient left: in chair;with call bell/phone within reach;with chair alarm set Nurse Communication: Mobility status PT Visit Diagnosis: Unsteadiness on feet (R26.81);Other abnormalities of gait and mobility (R26.89);Muscle weakness (generalized) (M62.81);History of falling (Z91.81);Difficulty in walking, not elsewhere classified (R26.2);Pain Pain - Right/Left: Left Pain - part of body: Shoulder;Leg;Hip     Time: 1010-1032 PT Time Calculation (min) (ACUTE ONLY): 22 min  Charges:  $Therapeutic Activity: 8-22 mins                       Lillia Pauls, PT, DPT Acute Rehabilitation Services Pager [phone] Office [phone]    Norval Morton 03/15/2020, 1:22 PM

## 2020-03-15 NOTE — Progress Notes (Signed)
Orthopaedic Trauma Progress Note  S: Doing okay this morning.  Continues to have a difficult time moving her shoulder but better than yesterday. No issues with her left hip. No questions or concerns currently.  O:  Vitals:   03/14/20 2300 03/15/20 0500  BP: 140/77 132/65  Pulse: 90 92  Resp: 16 17  Temp: 98.7 F (37.1 C) 98.2 F (36.8 C)  SpO2: 94% 95%    General: Sitting up in bed, no acute distress.  Respiratory: No increased work of breathing.  Left upper extremity:  Incision clean, dry, intact.  Bruising noted throughout the upper arm.  Tenderness with palpation over the shoulder and through the upper arm.  Nontender in the elbow or forearm.  Has full elbow and wrist motion.  Tolerates small amount of passive forward elevation of the shoulder.  Sensation and motor function intact through median, ulnar, radial nerve distributions.  Extremity warm and well-perfused.  2+ radial pulse Left lower extremity: Incisions are clean, dry, intact with Dermabond in place.  Tenderness with palpation over the hip but otherwise nontender throughout the extremity.  Compartments are soft and compressible.  Ankle dorsiflexion/plantarflexion is intact.  Sensation intact to light touch distally.  2+ DP pulse  Imaging: Stable post op imaging.   Labs:  Results for orders placed or performed during the hospital encounter of 03/12/20 (from the past 24 hour(s))  CBC     Status: Abnormal   Collection Time: 03/15/20  1:58 AM  Result Value Ref Range   WBC 10.5 4.0 - 10.5 K/uL   RBC 3.60 (L) 3.87 - 5.11 MIL/uL   Hemoglobin 10.8 (L) 12.0 - 15.0 g/dL   HCT 16.1 (L) 09.6 - 04.5 %   MCV 88.9 80.0 - 100.0 fL   MCH 30.0 26.0 - 34.0 pg   MCHC 33.8 30.0 - 36.0 g/dL   RDW 40.9 81.1 - 91.4 %   Platelets 168 150 - 400 K/uL   nRBC 0.0 0.0 - 0.2 %  Basic metabolic panel     Status: Abnormal   Collection Time: 03/15/20  1:58 AM  Result Value Ref Range   Sodium 134 (L) 135 - 145 mmol/L   Potassium 3.9 3.5 - 5.1  mmol/L   Chloride 101 98 - 111 mmol/L   CO2 24 22 - 32 mmol/L   Glucose, Bld 132 (H) 70 - 99 mg/dL   BUN 11 8 - 23 mg/dL   Creatinine, Ser 7.82 0.44 - 1.00 mg/dL   Calcium 8.6 (L) 8.9 - 10.3 mg/dL   GFR calc non Af Amer >60 >60 mL/min   GFR calc Af Amer >60 >60 mL/min   Anion gap 9 5 - 15    Assessment: 86 year old female status post fall, 3 Days Post-Op   Injuries: 1.  Left proximal humerus fracture s/p ORIF 2.  Left intertrochanteric femur fracture s/p intramedullary nailing  Weightbearing: WBAT LLE, NWB LUE  Insicional and dressing care: Okay to leave open to air, change PRN Showering: Okay to begin showering on 03/15/2020  Orthopedic device(s): Sling for comfort LUE   Okay for unrestricted passive and active range of motion of the left shoulder.  CV/Blood loss: Acute blood loss anemia, Hgb 10.8 this morning. Received 2 units PRBCs yesterday  Pain management:  1. Tylenol 325-650 mg q 6 hours PRN 2. Robaxin 500 mg q 6 hours PRN 3. Norco 5-325 q 6 hours PRN 4. Dilaudid 0.5 mg q 3 hours PRN  VTE prophylaxis: Lovenox and SCDs  ID:  Ancef 2gm post op  Foley/Lines: No foley, KVO IVFs  Medical co-morbidities: Severe aortic stenosis, hypertension, hyperlipidemia  Impediments to Fracture Healing: Polytrauma.  Vitamin D level 23, continue D3 supplementation.  Dispo: Up with therapies as tolerated.  PT/OT recommending SNF.  Okay for discharge from Ortho standpoint once cleared from medicine team.   Have signed and placed discharge Rx for pain medication, Vit D supplementation, and DVT prophylaxis in patient's chart  Follow - up plan: 2 weeks for repeat x-rays of left shoulder and left hip  Contact information:  Katha Hamming MD, Patrecia Pace PA-C   Saliyah Gillin [facility] [phone] (office) [email or web address]

## 2020-03-15 NOTE — Progress Notes (Signed)
PROGRESS NOTE  Anna Ewing TOI:[id number] DOB: 1929/02/05 DOA: 03/12/2020 PCP: Margaree Mackintosh, MD  HPI/Recap of past 24 hours:  HPI: Anna Ewing is a 86 y.o. female with medical history significant of HTN, HLD, presented with mechanical fall this morning. Patient was standing in the kitchen when she turned and then fell to the ground. She landed on her left hip and left shoulder. She is complaining of 10 out of 10 pain in those areas. She denies hitting her head and there is no loss of consciousness. She denied any light headedness, palpitation, blurry vision, any limb weakness or numbness before or after she fell.  ED Course: Xray shows Comminuted intertrochanteric LEFT femur fracture with varus Angulation; Displaced and impacted humeral head and neck fractures   03/15/20:  POD#3 post left shoulder and left hip repair.  Seen and examined.   No acute events overnight.   No new complaints.  Reports left hip pain prior to taking her pain medicine.  Denies any chest pain or shortness of breath in the bed.  Assessment/Plan: Active Problems:   Hip fracture, unspecified laterality, closed, initial encounter (HCC)   Humerus head fracture, left, closed, initial encounter   Hip fracture requiring operative repair, left, closed, initial encounter (HCC)   Left femoral neck fracture with varus angulation/Left Humeral neck head fracture post left hip and left shoulder repair on 03/12/2020 by Dr. Jena Gauss Continue pain control PT recommended SNF TOC assisting with placement DVT per orthopedic surgery recommendation PT OT per orthopedic surgery recommendation  Acute blood loss anemia post surgery Hg drop from 11 to 6.6 Hemoglobin is stable post 2 unit PRBC transfusion Hemoglobin 10.9. No overt bleeding. Defer to orthopedic surgery to restart chemical DVT prophylaxis.  HTN Blood pressure is at goal Continue home meds Continue Norvasc 5 mg daily  HLD Continue Crestor  Prolonged  QTC Repeat EKG, avoid Tramadol Repeated EKG QTC 441 from greater than 500  Prediabetes A1c 6.3 on 05/27/2019 Repeat A1c    DVT prophylaxis:  SCDs.  Defer to orthopedic surgery to restart chemical DVT prophylaxis. Code Status: Full code Family Communication: will call family  Disposition Plan: patient is from home.  Anticipate dc to SNF once Hg is stable likely in the next 24 H once bed is available. Consults called: Orthopedic surgeon       Objective: Vitals:   03/14/20 1658 03/14/20 2000 03/14/20 2300 03/15/20 0500  BP: (!) 144/63 (!) 141/76 140/77 132/65  Pulse: 100 96 90 92  Resp: 16 15 16 17   Temp: 100.3 F (37.9 C) 99.1 F (37.3 C) 98.7 F (37.1 C) 98.2 F (36.8 C)  TempSrc: Oral Oral Oral Oral  SpO2: 98% 96% 94% 95%  Weight:      Height:        Intake/Output Summary (Last 24 hours) at 03/15/2020 0931 Last data filed at 03/14/2020 2300 Gross per 24 hour  Intake 630 ml  Output 1000 ml  Net -370 ml   Filed Weights   03/12/20 1306  Weight: 49.9 kg    Exam: No significant changes from prior exam.  . General: 86 y.o. year-old female Well developed well nourished in no acute distress . Cardiovascular: Regular rate and rhythm no rubs or gallops.   92 Respiratory: Clear consultation no wheezes no rales. . Abdomen: Soft nontender normal bowel sounds present  . musculoskeletal: No lower extremity edema bilaterally.  Left hip surgical site appear clean with no drainage. Marland Kitchen Psychiatry: Mood is appropriate for  condition and setting.   Data Reviewed: CBC: Recent Labs  Lab 03/12/20 1210 03/13/20 0336 03/14/20 0629 03/14/20 0904 03/15/20 0158  WBC 9.9 9.0 10.5  --  10.5  NEUTROABS 7.4  --   --   --   --   HGB 11.4* 8.1* 6.9* 6.6* 10.8*  HCT 34.9* 24.4* 20.9* 20.2* 32.0*  MCV 93.1 91.0 92.9  --  88.9  PLT 258 207 172  --  168   Basic Metabolic Panel: Recent Labs  Lab 03/12/20 1210 03/14/20 0629 03/15/20 0158  NA 134* 130* 134*  K 3.9 4.5 3.9  CL  102 100 101  CO2 24 23 24   GLUCOSE 185* 142* 132*  BUN 14 14 11   CREATININE 0.64 0.53 0.46  CALCIUM 9.2 8.5* 8.6*   GFR: Estimated Creatinine Clearance: 37.6 mL/min (by C-G formula based on SCr of 0.46 mg/dL). Liver Function Tests: No results for input(s): AST, ALT, ALKPHOS, BILITOT, PROT, ALBUMIN in the last 168 hours. No results for input(s): LIPASE, AMYLASE in the last 168 hours. No results for input(s): AMMONIA in the last 168 hours. Coagulation Profile: No results for input(s): INR, PROTIME in the last 168 hours. Cardiac Enzymes: No results for input(s): CKTOTAL, CKMB, CKMBINDEX, TROPONINI in the last 168 hours. BNP (last 3 results) No results for input(s): PROBNP in the last 8760 hours. HbA1C: No results for input(s): HGBA1C in the last 72 hours. CBG: Recent Labs  Lab 03/12/20 1137  GLUCAP 165*   Lipid Profile: No results for input(s): CHOL, HDL, LDLCALC, TRIG, CHOLHDL, LDLDIRECT in the last 72 hours. Thyroid Function Tests: No results for input(s): TSH, T4TOTAL, FREET4, T3FREE, THYROIDAB in the last 72 hours. Anemia Panel: No results for input(s): VITAMINB12, FOLATE, FERRITIN, TIBC, IRON, RETICCTPCT in the last 72 hours. Urine analysis:    Component Value Date/Time   BILIRUBINUR NEG 05/31/2019 1131   PROTEINUR Negative 05/31/2019 1131   UROBILINOGEN 0.2 05/31/2019 1131   NITRITE NEG 05/31/2019 1131   LEUKOCYTESUR Negative 05/31/2019 1131   Sepsis Labs: @LABRCNTIP (procalcitonin:4,lacticidven:4)  ) Recent Results (from the past 240 hour(s))  Respiratory Panel by RT PCR (Flu A&B, Covid) - Nasopharyngeal Swab     Status: None   Collection Time: 03/12/20 12:16 PM   Specimen: Nasopharyngeal Swab  Result Value Ref Range Status   SARS Coronavirus 2 by RT PCR NEGATIVE NEGATIVE Final    Comment: (NOTE) SARS-CoV-2 target nucleic acids are NOT DETECTED. The SARS-CoV-2 RNA is generally detectable in upper respiratoy specimens during the acute phase of infection. The  lowest concentration of SARS-CoV-2 viral copies this assay can detect is 131 copies/mL. A negative result does not preclude SARS-Cov-2 infection and should not be used as the sole basis for treatment or other patient management decisions. A negative result may occur with  improper specimen collection/handling, submission of specimen other than nasopharyngeal swab, presence of viral mutation(s) within the areas targeted by this assay, and inadequate number of viral copies (<131 copies/mL). A negative result must be combined with clinical observations, patient history, and epidemiological information. The expected result is Negative. Fact Sheet for Patients:  06/02/2019 Fact Sheet for Healthcare Providers:  This test is not yet ap proved or cleared by the 03/14/20 FDA and  has been authorized for detection and/or diagnosis of SARS-CoV-2 by FDA under an Emergency Use Authorization (EUA). This EUA will remain  in effect (meaning this test can be used) for the duration of the COVID-19 declaration under Section 564(b)(1) of the Act, 21  U.S.C. section 360bbb-3(b)(1), unless the authorization is terminated or revoked sooner.    Influenza A by PCR NEGATIVE NEGATIVE Final   Influenza B by PCR NEGATIVE NEGATIVE Final    Comment: (NOTE) The Xpert Xpress SARS-CoV-2/FLU/RSV assay is intended as an aid in  the diagnosis of influenza from Nasopharyngeal swab specimens and  should not be used as a sole basis for treatment. Nasal washings and  aspirates are unacceptable for Xpert Xpress SARS-CoV-2/FLU/RSV  testing. Fact Sheet for Patients: PinkCheek.be Fact Sheet for Healthcare Providers: GravelBags.it This test is not yet approved or cleared by the [place] FDA and  has been authorized for detection and/or diagnosis of SARS-CoV-2 by  FDA under an Emergency  Use Authorization (EUA). This EUA will remain  in effect (meaning this test can be used) for the duration of the  Covid-19 declaration under Section 564(b)(1) of the Act, 21  U.S.C. section 360bbb-3(b)(1), unless the authorization is  terminated or revoked. Performed at [facility], [place] [address], [place], [place] [address]       Studies: No results found.  Scheduled Meds: . amLODipine  5 mg Oral Daily  . cholecalciferol  2,000 Units Oral Daily  . feeding supplement (ENSURE ENLIVE)  237 mL Oral BID BM  . rosuvastatin  5 mg Oral Daily    Continuous Infusions:    LOS: 3 days     Kayleen Memos, MD [facility] Pager [phone]  If 7PM-7AM, please contact night-coverage [email or web address] Password [facility] 03/15/2020, 9:31 AM

## 2020-03-15 NOTE — TOC Progression Note (Signed)
Transition of Care [facility]) - Progression Note    Patient Details  Name: Anna Ewing MRN: [id number] Date of Birth: 1929/05/31  Transition of Care [facility] CM/SW Contact  Epifanio Lesches, RN Phone Number: [phone] 03/15/2020, 2:38 PM  Clinical Narrative:    Pt will transition to [facility]. Awaiting updated COVID to result. Pt will probably d/c in am. Facility unable to plan  arrival after 3:30 pm. TOC team will continue to monitor ...   Expected Discharge Plan: Skilled Nursing Facility Barriers to Discharge: Other (comment)  Expected Discharge Plan and Services Expected Discharge Plan: Skilled Nursing Facility         Expected Discharge Date: 03/15/20                                     Social Determinants of Health (SDOH) Interventions    Readmission Risk Interventions No flowsheet data found.

## 2020-03-15 NOTE — Plan of Care (Signed)
  Problem: Education: Goal: Verbalization of understanding the information provided (i.e., activity precautions, restrictions, etc) will improve Outcome: Progressing   Problem: Activity: Goal: Ability to ambulate and perform ADLs will improve Outcome: Progressing   Problem: Self-Concept: Goal: Ability to maintain and perform role responsibilities to the fullest extent possible will improve Outcome: Progressing   Problem: Pain Management: Goal: Pain level will decrease Outcome: Progressing   Problem: Education: Goal: Knowledge of General Education information will improve Description: Including pain rating scale, medication(s)/side effects and non-pharmacologic comfort measures Outcome: Progressing   Problem: Health Behavior/Discharge Planning: Goal: Ability to manage health-related needs will improve Outcome: Progressing   Problem: Clinical Measurements: Goal: Ability to maintain clinical measurements within normal limits will improve Outcome: Progressing Goal: Will remain free from infection Outcome: Progressing Goal: Diagnostic test results will improve Outcome: Progressing Goal: Respiratory complications will improve Outcome: Progressing Goal: Cardiovascular complication will be avoided Outcome: Progressing   Problem: Activity: Goal: Risk for activity intolerance will decrease Outcome: Progressing   Problem: Nutrition: Goal: Adequate nutrition will be maintained Outcome: Progressing   Problem: Coping: Goal: Level of anxiety will decrease Outcome: Progressing   Problem: Elimination: Goal: Will not experience complications related to bowel motility Outcome: Progressing Goal: Will not experience complications related to urinary retention Outcome: Progressing   Problem: Pain Managment: Goal: General experience of comfort will improve Outcome: Progressing   Problem: Safety: Goal: Ability to remain free from injury will improve Outcome: Progressing    Problem: Skin Integrity: Goal: Risk for impaired skin integrity will decrease Outcome: Progressing   

## 2020-03-16 ENCOUNTER — Telehealth: Payer: Self-pay | Admitting: Internal Medicine

## 2020-03-16 DIAGNOSIS — S42202D Unspecified fracture of upper end of left humerus, subsequent encounter for fracture with routine healing: Secondary | ICD-10-CM | POA: Diagnosis not present

## 2020-03-16 DIAGNOSIS — E785 Hyperlipidemia, unspecified: Secondary | ICD-10-CM | POA: Diagnosis not present

## 2020-03-16 DIAGNOSIS — W19XXXD Unspecified fall, subsequent encounter: Secondary | ICD-10-CM | POA: Diagnosis not present

## 2020-03-16 DIAGNOSIS — S72002A Fracture of unspecified part of neck of left femur, initial encounter for closed fracture: Secondary | ICD-10-CM | POA: Diagnosis not present

## 2020-03-16 DIAGNOSIS — D62 Acute posthemorrhagic anemia: Secondary | ICD-10-CM | POA: Diagnosis not present

## 2020-03-16 DIAGNOSIS — Z743 Need for continuous supervision: Secondary | ICD-10-CM | POA: Diagnosis not present

## 2020-03-16 DIAGNOSIS — T148XXD Other injury of unspecified body region, subsequent encounter: Secondary | ICD-10-CM | POA: Diagnosis not present

## 2020-03-16 DIAGNOSIS — Z4789 Encounter for other orthopedic aftercare: Secondary | ICD-10-CM | POA: Diagnosis not present

## 2020-03-16 DIAGNOSIS — I10 Essential (primary) hypertension: Secondary | ICD-10-CM | POA: Diagnosis not present

## 2020-03-16 DIAGNOSIS — D649 Anemia, unspecified: Secondary | ICD-10-CM | POA: Diagnosis not present

## 2020-03-16 DIAGNOSIS — W19XXXA Unspecified fall, initial encounter: Secondary | ICD-10-CM | POA: Diagnosis not present

## 2020-03-16 DIAGNOSIS — S72142D Displaced intertrochanteric fracture of left femur, subsequent encounter for closed fracture with routine healing: Secondary | ICD-10-CM | POA: Diagnosis not present

## 2020-03-16 DIAGNOSIS — Z7401 Bed confinement status: Secondary | ICD-10-CM | POA: Diagnosis not present

## 2020-03-16 DIAGNOSIS — M2550 Pain in unspecified joint: Secondary | ICD-10-CM | POA: Diagnosis not present

## 2020-03-16 DIAGNOSIS — R7303 Prediabetes: Secondary | ICD-10-CM | POA: Diagnosis not present

## 2020-03-16 DIAGNOSIS — R5381 Other malaise: Secondary | ICD-10-CM | POA: Diagnosis not present

## 2020-03-16 DIAGNOSIS — S72002D Fracture of unspecified part of neck of left femur, subsequent encounter for closed fracture with routine healing: Secondary | ICD-10-CM | POA: Diagnosis not present

## 2020-03-16 DIAGNOSIS — R262 Difficulty in walking, not elsewhere classified: Secondary | ICD-10-CM | POA: Diagnosis not present

## 2020-03-16 LAB — BASIC METABOLIC PANEL
Anion gap: 11 (ref 5–15)
BUN: 15 mg/dL (ref 8–23)
CO2: 20 mmol/L — ABNORMAL LOW (ref 22–32)
Calcium: 8.2 mg/dL — ABNORMAL LOW (ref 8.9–10.3)
Chloride: 101 mmol/L (ref 98–111)
Creatinine, Ser: 0.48 mg/dL (ref 0.44–1.00)
GFR calc Af Amer: >60 mL/min (ref >60)
GFR calc non Af Amer: >60 mL/min (ref >60)
Glucose, Bld: 119 mg/dL — ABNORMAL HIGH (ref 70–99)
Potassium: 4 mmol/L (ref 3.5–5.1)
Sodium: 132 mmol/L — ABNORMAL LOW (ref 135–145)

## 2020-03-16 LAB — CBC
HCT: 29.1 % — ABNORMAL LOW (ref 36.0–46.0)
Hemoglobin: 9.8 g/dL — ABNORMAL LOW (ref 12.0–15.0)
MCH: 30.2 pg (ref 26.0–34.0)
MCHC: 33.7 g/dL (ref 30.0–36.0)
MCV: 89.8 fL (ref 80.0–100.0)
Platelets: 225 10*3/uL (ref 150–400)
RBC: 3.24 MIL/uL — ABNORMAL LOW (ref 3.87–5.11)
RDW: 13.7 % (ref 11.5–15.5)
WBC: 10 10*3/uL (ref 4.0–10.5)
nRBC: 0 % (ref 0.0–0.2)

## 2020-03-16 MED ORDER — ENSURE ENLIVE PO LIQD: 237.0000 mL | Freq: Two times a day (BID) | ORAL | 0 refills | Status: AC

## 2020-03-16 NOTE — Progress Notes (Signed)
Occupational Therapy Treatment Patient Details Name: Anna Ewing MRN: [id number] DOB: 05/02/1929 Today's Date: 03/16/2020    History of present illness Anna Ewing is a 86 y.o. female with medical history significant of HTN, HLD, presented with mechanical fall this morning. s/p left femur IM nail and ORIF left proximal humerus.   OT comments  Pt making slow but steady progress with all adls. Pt transferred to commode for toileting with +1 mod assist. Pt cleaned self with min assist to ensure pt was fully clean.  Pt completed standing tasks focusing on waiting before sitting to make sure chair is ready and pt is safe to sit.  AROM initiated to L shoulder. Pt tolerated well.  Will continue to focus on tasks in standing.   Follow Up Recommendations  SNF;Supervision/Assistance - 24 hour    Equipment Recommendations  3 in 1 bedside commode    Recommendations for Other Services      Precautions / Restrictions Precautions Precautions: Fall Required Braces or Orthoses: Sling Restrictions Weight Bearing Restrictions: Yes LUE Weight Bearing: Non weight bearing LLE Weight Bearing: Weight bearing as tolerated Other Position/Activity Restrictions: per ortho note:allow unrestricted PROM and AROM as tolerated       Mobility Bed Mobility               General bed mobility comments: Pt in chair on arrival.  Transfers Overall transfer level: Needs assistance Equipment used: 1 person hand held assist Transfers: Sit to/from [employer] Sit to Stand: Mod assist Stand pivot transfers: Mod assist       General transfer comment: Pt requires cues to push up with RUE and not left. pt did not have sling on on arrival.  Feel this is helpful to remind her not to use this arm to weightbear.  Once up, pt requires cues to stand straight and reach back to chair to sit.    Balance Overall balance assessment: Needs assistance Sitting-balance support: Feet supported Sitting  balance-Leahy Scale: Fair     Standing balance support: Single extremity supported;During functional activity Standing balance-Leahy Scale: Poor Standing balance comment: Pt very reliant on outside assist to stand.                           ADL either performed or assessed with clinical judgement   ADL Overall ADL's : Needs assistance/impaired Eating/Feeding: Set up;Sitting   Grooming: Set up;Sitting                   Toilet Transfer: Moderate assistance;Stand-pivot;BSC Toilet Transfer Details (indicate cue type and reason): Pt transferred to [facility] with +1 assist today.  Pt requires cues to not use LUE to push up. Toileting- Clothing Manipulation and Hygiene: Moderate assistance;Sit to/from stand;Cueing for compensatory techniques Toileting - Clothing Manipulation Details (indicate cue type and reason): Pt able to stand with +1 assist and therapist assiting pt to stand could clean pt for second time just to make sure pt cleaned self well in sitting.     Functional mobility during ADLs: Moderate assistance;+2 for physical assistance General ADL Comments: Pt required +1 assist today unless walking.  If pt was to ambulate, +2 would be necessary.  Pt more willing to work today with transfers, adls and LUE mobility.     Vision   Vision Assessment?: No apparent visual deficits   Perception     Praxis      Cognition Arousal/Alertness: Awake/alert Behavior During Therapy: WFL for  tasks assessed/performed Overall Cognitive Status: Within Functional Limits for tasks assessed                                 General Comments: [facility]        Exercises General Exercises - Upper Extremity Shoulder Flexion: PROM;Left;10 reps;Seated Shoulder Extension: PROM;Left;10 reps;Seated Elbow Flexion: AROM;Seated;10 reps Elbow Extension: AROM;Seated;10 reps Wrist Flexion: AROM;Seated;10 reps Wrist Extension: AROM;Seated;10 reps Digit Composite Flexion: AROM;Seated;10  reps Composite Extension: AROM;Seated;10 reps   Shoulder Instructions       General Comments Pt making slow improvements noted with more ROM of LUE and increased desire to stand and get onto the commode.      Pertinent Vitals/ Pain       Pain Assessment: Faces Faces Pain Scale: Hurts little more Pain Location: L UE and L LE with movement, exercise Pain Descriptors / Indicators: Discomfort;Grimacing;Guarding;Sore Pain Intervention(s): Limited activity within patient's tolerance;Monitored during session;Repositioned;Patient requesting pain meds-RN notified  Home Living                                          Prior Functioning/Environment              Frequency  Min 2X/week        Progress Toward Goals  OT Goals(current goals can now be found in the care plan section)  Progress towards OT goals: Progressing toward goals  Acute Rehab OT Goals Patient Stated Goal: to improve mobility OT Goal Formulation: With patient Time For Goal Achievement: 03/27/20 Potential to Achieve Goals: Good ADL Goals Pt Will Perform Grooming: with min guard assist;standing Pt Will Perform Upper Body Dressing: with min guard assist;sitting Pt Will Perform Lower Body Dressing: with min assist;sit to/from stand Pt Will Transfer to Toilet: ambulating;with min assist Additional ADL Goal #1: Pt will demonstrate independence with PROM/AROM LUE HEP with provided handout.  Plan Discharge plan remains appropriate    Co-evaluation                 AM-PAC OT "6 Clicks" Daily Activity     Outcome Measure   Help from another person eating meals?: A Little Help from another person taking care of personal grooming?: A Little Help from another person toileting, which includes using toliet, bedpan, or urinal?: A Lot Help from another person bathing (including washing, rinsing, drying)?: A Lot Help from another person to put on and taking off regular upper body clothing?: A  Lot Help from another person to put on and taking off regular lower body clothing?: A Lot 6 Click Score: 14    End of Session Equipment Utilized During Treatment: Other (comment)(sling)  OT Visit Diagnosis: Unsteadiness on feet (R26.81);Other abnormalities of gait and mobility (R26.89);Muscle weakness (generalized) (M62.81);History of falling (Z91.81);Pain Pain - Right/Left: Left Pain - part of body: Shoulder;Arm;Leg   Activity Tolerance Patient tolerated treatment well   Patient Left in chair;with call bell/phone within reach;with chair alarm set   Nurse Communication Mobility status        Time: 7341-9379 OT Time Calculation (min): 27 min  Charges: OT General Charges $OT Visit: 1 Visit OT Treatments $Self Care/Home Management : 23-37 mins   Glenford Peers 03/16/2020, 10:05 AM

## 2020-03-16 NOTE — Discharge Summary (Signed)
Discharge Summary  Anna Ewing MWN:[id number] DOB: 1929/09/07  PCP: Margaree Mackintosh, MD  Admit date: 03/12/2020 Discharge date: 03/16/2020  Time spent: 35 minutes  Recommendations for Outpatient Follow-up:  1. Follow-up with orthopedic surgery 2. Follow-up with your primary care provider 3. Take your medications as prescribed 4. Continue physical therapy with assistance and fall precautions  Discharge Diagnoses:  Active Hospital Problems   Diagnosis Date Noted  . Hip fracture, unspecified laterality, closed, initial encounter (HCC) 03/12/2020  . Humerus head fracture, left, closed, initial encounter 03/12/2020  . Hip fracture requiring operative repair, left, closed, initial encounter [facility]) 03/12/2020    Resolved Hospital Problems  No resolved problems to display.    Discharge Condition: Stable  Diet recommendation: Resume previous diet.  Vitals:   03/15/20 2355 03/16/20 0300  BP: (!) 145/69 (!) 141/73  Pulse: 90 97  Resp: 17 16  Temp: 99.6 F (37.6 C) 99.3 F (37.4 C)  SpO2: 94% 92%    History of present illness:  Alexandr L Steedis a 86 y.o.femalewith medical history significant ofHTN, HLD, presented with mechanical fall on the day of presentation. Patientwas standingin the kitchen when she turned and then fell to the ground. She landed on her left hip and left shoulder. She denies hitting her head and there is no loss of consciousness.She denied any light headedness, palpitation, blurry vision, any limb weakness or numbness prior to or after her fall.   ED Course:Xray showsComminuted intertrochanteric LEFT femur fracture with varus Angulation;Displaced and impacted left humeral head and neck fractures.   03/16/20:  POD#4 post left shoulder and left hip repair.  Seen and examined.  Sitting in the chair this morning.  No acute events overnight.  She has no new complaints this morning.    Hospital Course:  Active Problems:   Hip fracture, unspecified  laterality, closed, initial encounter (HCC)   Humerus head fracture, left, closed, initial encounter   Hip fracture requiring operative repair, left, closed, initial encounter (HCC)  Leftfemoral neck fracture with varus angulation/LeftHumeralneck head fracture post left hip and left shoulder surgical repair on 03/12/2020 by Dr. Jena Gauss Continue pain control Continue PT OT with assistance and fall precautions at SNF Continue subcu Lovenox for DVT prophylaxis as recommended by orthopedic surgery Continue vitamin D3 supplements Follow-up with orthopedic surgery in 2 weeks for repeat x-rays of left shoulder and left hip.  Acute blood loss anemia post surgery Hg drop from 11 to 6.6 Post 2 unit PRBC transfusion 03/14/20 Hemoglobin 9.8 from 10.8 with MCV of 89 No overt bleeding Asymptomatic  HTN Blood pressure is stable. She is on Norvasc 5 mg daily, continue Follow-up with your PCP  HLD Continue Crestor  Resolved prolonged QTC Repeated EKG QTC 441 (03/13/20) from 568 (03/12/20) Avoid QTC prolonging agents  Prediabetes A1c 6.3 on 05/27/2019 Not on hypoglycemics at home Follow-up with your PCP    Code Status:Full code  Consults called:Orthopedic surgeon     Discharge Exam: BP (!) 141/73 (BP Location: Right Arm)   Pulse 97   Temp 99.3 F (37.4 C) (Oral)   Resp 16   Ht  (1.575 m)   Wt 49.9 kg   SpO2 92%   BMI 20.12 kg/m  . General: 86 y.o. year-old female well developed well nourished in no acute distress.  Alert and interactive. . Cardiovascular: Regular rate and rhythm with no rubs or gallops. Marland Kitchen Respiratory: Clear to auscultation with no wheezes or rales.  . Abdomen: Soft nontender nondistended with normal  bowel sounds. . Musculoskeletal: No lower extremity edema bilaterally. Marland Kitchen Psychiatry: Mood is appropriate for condition and setting.  Discharge Instructions You were cared for by a hospitalist during your hospital stay. If you have any questions  about your discharge medications or the care you received while you were in the hospital after you are discharged, you can call the unit and asked to speak with the hospitalist on call if the hospitalist that took care of you is not available. Once you are discharged, your primary care physician will handle any further medical issues. Please note that NO REFILLS for any discharge medications will be authorized once you are discharged, as it is imperative that you return to your primary care physician (or establish a relationship with a primary care physician if you do not have one) for your aftercare needs so that they can reassess your need for medications and monitor your lab values.   Allergies as of 03/16/2020      Reactions   Amoxicillin Rash      Medication List    TAKE these medications   amLODipine 5 MG tablet Commonly known as: NORVASC TAKE 1 TABLET BY MOUTH EVERY DAY   enoxaparin 40 MG/0.4ML injection Commonly known as: LOVENOX Inject 0.4 mLs (40 mg total) into the skin daily for 21 days.   ergocalciferol 1.25 MG (50000 UT) capsule Commonly known as: Drisdol One po weekly for Vitamin D deficiency   feeding supplement (ENSURE ENLIVE) Liqd Take 237 mLs by mouth 2 (two) times daily between meals for 7 days.   fluticasone 50 MCG/ACT nasal spray Commonly known as: Flonase 1 or 2 sprays each nostril, once daily at bedtime   HYDROcodone-acetaminophen 5-325 MG tablet Commonly known as: NORCO/VICODIN Take 1 tablet by mouth every 6 (six) hours as needed for severe pain.   loratadine-pseudoephedrine 5-120 MG tablet Commonly known as: CLARITIN-D 12-hour Take 1 tablet by mouth as needed.   rosuvastatin 5 MG tablet Commonly known as: Crestor Take 1 tablet (5 mg total) by mouth daily.   SUMAtriptan 100 MG tablet Commonly known as: IMITREX One po at onset of migraine and may repeat once in 2 hours   Vitamin D 125 MCG (5000 UT) Caps Take 5,000 Units by mouth daily.              Durable Medical Equipment  (From admission, onward)         Start     Ordered   03/14/20 0524  For home use only DME Cane  Once     03/14/20 0523   03/14/20 0523  For home use only DME 3 n 1  Once     03/14/20 0522         Allergies  Allergen Reactions  . Amoxicillin Rash    Contact information for follow-up providers    Haddix, Gillie Manners, MD. Schedule an appointment as soon as possible for a visit in 2 week(s).   Specialty: Orthopedic Surgery Why: for repeat x-rays of left shoulder and left hip Contact information: [address] [place] [phone]            Contact information for after-discharge care    Destination    [facility] .   Service: Skilled Nursing Contact information: [address] [place] [address] [phone]                   The results of significant diagnostics from this hospitalization (including  imaging, microbiology, ancillary and laboratory) are listed below for reference.    Significant Diagnostic Studies: DG Chest 1 View  Result Date: 03/12/2020 CLINICAL DATA:  Fall, shoulder pain EXAM: CHEST  1 VIEW COMPARISON:  CT chest of 07/02/2012 FINDINGS: Cardiomediastinal contours and hilar structures are normal. No consolidation. No pleural effusion. No pneumothorax. LEFT-sided rib fracture at the eighth rib with more chronic appearance, LEFT posterior ninth rib is age indeterminate. Displaced and angulated humeral neck fracture partially visualized. IMPRESSION: 1. No acute cardiopulmonary disease. Impacted angulated humeral neck fracture on the LEFT partially visualized. 2. LEFT-sided rib fractures, age indeterminate but with suggestion of chronic features. Correlate with any point tenderness in these areas. Electronically Signed   By: Donzetta Kohut M.D.   On: 03/12/2020 13:21   CT HEAD WO CONTRAST  Result Date: 03/12/2020 CLINICAL DATA:  Larey Seat. Hit head. EXAM: CT HEAD  WITHOUT CONTRAST CT CERVICAL SPINE WITHOUT CONTRAST TECHNIQUE: Multidetector CT imaging of the head and cervical spine was performed following the standard protocol without intravenous contrast. Multiplanar CT image reconstructions of the cervical spine were also generated. COMPARISON:  MRI brain 07/16/2017 FINDINGS: CT HEAD FINDINGS Brain: Stable age related cerebral atrophy, ventriculomegaly and periventricular white matter disease. No extra-axial fluid collections are identified. No CT findings for acute hemispheric infarction or intracranial hemorrhage. No mass lesions. The brainstem and cerebellum are normal. Vascular: Vascular calcifications but no definite aneurysm or hyperdense vessels. Skull: No skull fracture or bone lesion. Sinuses/Orbits: The paranasal sinuses and mastoid air cells are clear. The globes are intact. Other: No scalp lesions or hematoma. CT CERVICAL SPINE FINDINGS Alignment: Normal overall alignment of the cervical vertebral bodies. Mild multilevel degenerative subluxations are noted due to advanced degenerative disc disease and facet disease. Skull base and vertebrae: No acute fractures are identified. Advanced facet disease noted with fused facet joints on the right at C4-5. Soft tissues and spinal canal: No prevertebral fluid or swelling. No visible canal hematoma. Disc levels: The spinal canal is fairly generous. No large disc protrusions or significant spinal stenosis. Mild multilevel foraminal stenosis due to uncinate spurring and facet disease. Upper chest: The lung apices are grossly clear. Biapical pleural and parenchymal scarring changes with calcifications are noted. Other: Bilateral carotid artery calcifications are noted. IMPRESSION: 1. Stable age related cerebral atrophy, ventriculomegaly and periventricular white matter disease. 2. No acute intracranial findings or skull fracture. 3. Advanced degenerative cervical disc disease and facet disease but no acute cervical spine  fracture. Electronically Signed   By: Rudie Meyer M.D.   On: 03/12/2020 13:28   CT CERVICAL SPINE WO CONTRAST  Result Date: 03/12/2020 CLINICAL DATA:  Larey Seat. Hit head. EXAM: CT HEAD WITHOUT CONTRAST CT CERVICAL SPINE WITHOUT CONTRAST TECHNIQUE: Multidetector CT imaging of the head and cervical spine was performed following the standard protocol without intravenous contrast. Multiplanar CT image reconstructions of the cervical spine were also generated. COMPARISON:  MRI brain 07/16/2017 FINDINGS: CT HEAD FINDINGS Brain: Stable age related cerebral atrophy, ventriculomegaly and periventricular white matter disease. No extra-axial fluid collections are identified. No CT findings for acute hemispheric infarction or intracranial hemorrhage. No mass lesions. The brainstem and cerebellum are normal. Vascular: Vascular calcifications but no definite aneurysm or hyperdense vessels. Skull: No skull fracture or bone lesion. Sinuses/Orbits: The paranasal sinuses and mastoid air cells are clear. The globes are intact. Other: No scalp lesions or hematoma. CT CERVICAL SPINE FINDINGS Alignment: Normal overall alignment of the cervical vertebral bodies. Mild multilevel degenerative subluxations are noted due to  advanced degenerative disc disease and facet disease. Skull base and vertebrae: No acute fractures are identified. Advanced facet disease noted with fused facet joints on the right at C4-5. Soft tissues and spinal canal: No prevertebral fluid or swelling. No visible canal hematoma. Disc levels: The spinal canal is fairly generous. No large disc protrusions or significant spinal stenosis. Mild multilevel foraminal stenosis due to uncinate spurring and facet disease. Upper chest: The lung apices are grossly clear. Biapical pleural and parenchymal scarring changes with calcifications are noted. Other: Bilateral carotid artery calcifications are noted. IMPRESSION: 1. Stable age related cerebral atrophy, ventriculomegaly and  periventricular white matter disease. 2. No acute intracranial findings or skull fracture. 3. Advanced degenerative cervical disc disease and facet disease but no acute cervical spine fracture. Electronically Signed   By: Rudie Meyer M.D.   On: 03/12/2020 13:28   DG Shoulder Left  Result Date: 03/12/2020 CLINICAL DATA:  Larey Seat. Left shoulder pain. EXAM: LEFT SHOULDER - 2+ VIEW COMPARISON:  None. FINDINGS: There is a displaced fracture of the humeral head and neck. Transverse fracture through the humeral neck with moderate impaction and medial and posterior displacement of the humeral head. The visualized left lung is clear and the visualized left ribs are intact. Remote healed left rib fractures are noted. IMPRESSION: Displaced and impacted humeral head and neck fractures. Electronically Signed   By: Rudie Meyer M.D.   On: 03/12/2020 13:22   DG Shoulder Left Port  Result Date: 03/12/2020 CLINICAL DATA:  Left shoulder fracture repair EXAM: LEFT SHOULDER COMPARISON:  03/12/2020 FINDINGS: Frontal and transscapular views of the left shoulder demonstrate plate and screw fixation across the humeral neck fracture with anatomic alignment. No dislocation. Stable degenerative changes of the acromioclavicular joint. IMPRESSION: 1. ORIF proximal left humerus. Electronically Signed   By: Sharlet Salina M.D.   On: 03/12/2020 20:52   DG Humerus Left  Result Date: 03/12/2020 CLINICAL DATA:  Open reduction and internal fixation of the proximal left humerus EXAM: LEFT HUMERUS - 2+ VIEW COMPARISON:  None. FINDINGS: A radiopaque fixation plate and multiple fixation screws are seen overlying the left humeral head and proximal left humeral shaft. Acute fracture deformity is seen along the surgical neck of the proximal left humerus with anatomic alignment. There is no evidence of dislocation. Soft tissues are unremarkable. IMPRESSION: Open reduction and internal fixation of the proximal left humerus with anatomic alignment.  Electronically Signed   By: Aram Candela M.D.   On: 03/12/2020 19:43   DG C-Arm 1-60 Min  Result Date: 03/12/2020 CLINICAL DATA:  Left femoral IM nail EXAM: LEFT FEMUR 2 VIEWS; DG C-ARM 1-60 MIN COMPARISON:  03/12/2020 FINDINGS: Eight fluoroscopic images are obtained during the performance of procedure and are provided for interpretation only. Images demonstrate intramedullary rod with proximal dynamic screw and distal interlocking screw traversing the intertrochanteric left hip fracture seen previously. Alignment is near anatomic. FLUOROSCOPY TIME:  2 minutes 47 seconds IMPRESSION: 1. ORIF left hip as above. Electronically Signed   By: Sharlet Salina M.D.   On: 03/12/2020 19:50   DG HIP PORT UNILAT W OR W/O PELVIS 1V LEFT  Result Date: 03/12/2020 CLINICAL DATA:  Postop left hip repair, intertrochanteric fracture EXAM: DG HIP (WITH OR WITHOUT PELVIS) 1V PORT LEFT COMPARISON:  03/12/2020 FINDINGS: Frontal view of the pelvis and cross-table lateral view of the left hip are obtained. Intramedullary rod with proximal dynamic screws and distal interlocking screw traverse the inter trochanteric left hip fracture seen previously. Alignment is anatomic.  Stable degenerative changes right hip. IMPRESSION: 1. ORIF left hip as above. Electronically Signed   By: Sharlet SalinaMichael  Brown M.D.   On: 03/12/2020 20:43   DG Hip Unilat With Pelvis 2-3 Views Left  Result Date: 03/12/2020 CLINICAL DATA:  86 year old female with acute LEFT hip pain following fall. Initial encounter. EXAM: DG HIP (WITH OR WITHOUT PELVIS) 2-3V LEFT COMPARISON:  None. FINDINGS: A comminuted intertrochanteric fracture of the LEFT proximal femur is noted with varus angulation. No subluxation or dislocation. Diffuse osteopenia is noted. IMPRESSION: Comminuted intertrochanteric LEFT femur fracture with varus angulation. Electronically Signed   By: Harmon PierJeffrey  Hu M.D.   On: 03/12/2020 13:19   DG FEMUR MIN 2 VIEWS LEFT  Result Date: 03/12/2020 CLINICAL  DATA:  Left femoral IM nail EXAM: LEFT FEMUR 2 VIEWS; DG C-ARM 1-60 MIN COMPARISON:  03/12/2020 FINDINGS: Eight fluoroscopic images are obtained during the performance of procedure and are provided for interpretation only. Images demonstrate intramedullary rod with proximal dynamic screw and distal interlocking screw traversing the intertrochanteric left hip fracture seen previously. Alignment is near anatomic. FLUOROSCOPY TIME:  2 minutes 47 seconds IMPRESSION: 1. ORIF left hip as above. Electronically Signed   By: Sharlet SalinaMichael  Brown M.D.   On: 03/12/2020 19:50    Microbiology: Recent Results (from the past 240 hour(s))  Respiratory Panel by RT PCR (Flu A&B, Covid) - Nasopharyngeal Swab     Status: None   Collection Time: 03/12/20 12:16 PM   Specimen: Nasopharyngeal Swab  Result Value Ref Range Status   SARS Coronavirus 2 by RT PCR NEGATIVE NEGATIVE Final    Comment: (NOTE) SARS-CoV-2 target nucleic acids are NOT DETECTED. The SARS-CoV-2 RNA is generally detectable in upper respiratoy specimens during the acute phase of infection. The lowest concentration of SARS-CoV-2 viral copies this assay can detect is 131 copies/mL. A negative result does not preclude SARS-Cov-2 infection and should not be used as the sole basis for treatment or other patient management decisions. A negative result may occur with  improper specimen collection/handling, submission of specimen other than nasopharyngeal swab, presence of viral mutation(s) within the areas targeted by this assay, and inadequate number of viral copies (<131 copies/mL). A negative result must be combined with clinical observations, patient history, and epidemiological information. The expected result is Negative. Fact Sheet for Patients:  https://www.moore.com/https://www.fda.gov/media/142436/download Fact Sheet for Healthcare Providers:  https://www.young.biz/https://www.fda.gov/media/142435/download This test is not yet ap proved or cleared by the [place] FDA and  has been  authorized for detection and/or diagnosis of SARS-CoV-2 by FDA under an Emergency Use Authorization (EUA). This EUA will remain  in effect (meaning this test can be used) for the duration of the COVID-19 declaration under Section 564(b)(1) of the Act, 21 U.S.C. section 360bbb-3(b)(1), unless the authorization is terminated or revoked sooner.    Influenza A by PCR NEGATIVE NEGATIVE Final   Influenza B by PCR NEGATIVE NEGATIVE Final    Comment: (NOTE) The Xpert Xpress SARS-CoV-2/FLU/RSV assay is intended as an aid in  the diagnosis of influenza from Nasopharyngeal swab specimens and  should not be used as a sole basis for treatment. Nasal washings and  aspirates are unacceptable for Xpert Xpress SARS-CoV-2/FLU/RSV  testing. Fact Sheet for Patients: https://www.moore.com/https://www.fda.gov/media/142436/download Fact Sheet for Healthcare Providers: https://www.young.biz/https://www.fda.gov/media/142435/download This test is not yet approved or cleared by the [place] FDA and  has been authorized for detection and/or diagnosis of SARS-CoV-2 by  FDA under an Emergency Use Authorization (EUA). This EUA will remain  in effect (meaning this test  can be used) for the duration of the  Covid-19 declaration under Section 564(b)(1) of the Act, 21  U.S.C. section 360bbb-3(b)(1), unless the authorization is  terminated or revoked. Performed at [facility], [place] [address], [place], [place] [address]   SARS CORONAVIRUS 2 (TAT 6-24 HRS)     Status: None   Collection Time: 03/15/20 10:00 AM  Result Value Ref Range Status   SARS Coronavirus 2 NEGATIVE NEGATIVE Final    Comment: (NOTE) SARS-CoV-2 target nucleic acids are NOT DETECTED. The SARS-CoV-2 RNA is generally detectable in upper and lower respiratory specimens during the acute phase of infection. Negative results do not preclude SARS-CoV-2 infection, do not rule out co-infections with other pathogens, and should not be used as the sole basis for treatment or other patient  management decisions. Negative results must be combined with clinical observations, patient history, and epidemiological information. The expected result is Negative. Fact Sheet for Patients: SugarRoll.be Fact Sheet for Healthcare Providers: https://www.woods-mathews.com/ This test is not yet approved or cleared by the [place] FDA and  has been authorized for detection and/or diagnosis of SARS-CoV-2 by FDA under an Emergency Use Authorization (EUA). This EUA will remain  in effect (meaning this test can be used) for the duration of the COVID-19 declaration under Section 56 4(b)(1) of the Act, 21 U.S.C. section 360bbb-3(b)(1), unless the authorization is terminated or revoked sooner. Performed at [facility], [place] [address], [place], [place] [address]      Labs: Basic Metabolic Panel: Recent Labs  Lab 03/12/20 1210 03/14/20 0629 03/15/20 0158 03/16/20 0129  NA 134* 130* 134* 132*  K 3.9 4.5 3.9 4.0  CL 102 100 101 101  CO2 24 23 24  20*  GLUCOSE 185* 142* 132* 119*  BUN 14 14 11 15   CREATININE 0.64 0.53 0.46 0.48  CALCIUM 9.2 8.5* 8.6* 8.2*   Liver Function Tests: No results for input(s): AST, ALT, ALKPHOS, BILITOT, PROT, ALBUMIN in the last 168 hours. No results for input(s): LIPASE, AMYLASE in the last 168 hours. No results for input(s): AMMONIA in the last 168 hours. CBC: Recent Labs  Lab 03/12/20 1210 03/12/20 1210 03/13/20 0336 03/14/20 0629 03/14/20 0904 03/15/20 0158 03/16/20 0129  WBC 9.9  --  9.0 10.5  --  10.5 10.0  NEUTROABS 7.4  --   --   --   --   --   --   HGB 11.4*   < > 8.1* 6.9* 6.6* 10.8* 9.8*  HCT 34.9*   < > 24.4* 20.9* 20.2* 32.0* 29.1*  MCV 93.1  --  91.0 92.9  --  88.9 89.8  PLT 258  --  207 172  --  168 225   < > = values in this interval not displayed.   Cardiac Enzymes: No results for input(s): CKTOTAL, CKMB, CKMBINDEX, TROPONINI in the last 168 hours. BNP: BNP (last 3  results) No results for input(s): BNP in the last 8760 hours.  ProBNP (last 3 results) No results for input(s): PROBNP in the last 8760 hours.  CBG: Recent Labs  Lab 03/12/20 1137  GLUCAP 165*       Signed:  Kayleen Memos, MD [facility] 03/16/2020, 10:26 AM

## 2020-03-16 NOTE — Progress Notes (Signed)
Discharge instructions/ pt report given to receiving RN at [facility] & Sh[facility] is here to transport pt.; Pt is in stable condition; vital signs within normal limits.

## 2020-03-16 NOTE — Progress Notes (Signed)
Nutrition Follow-up  DOCUMENTATION CODES:   Not applicable  INTERVENTION:  -Continue Ensure Enlive po BID, each supplement provides 350 kcal and 20 grams of protein  NUTRITION DIAGNOSIS:   Increased nutrient needs related to post-op healing as evidenced by estimated needs.  Ongoing  GOAL:   Patient will meet greater than or equal to 90% of their needs  Progressing   MONITOR:   PO intake, Supplement acceptance  REASON FOR ASSESSMENT:   Consult Assessment of nutrition requirement/status  ASSESSMENT:   Pt with PMH of HTN, HLD who is admitted after fall with L hip fx and L humerus fx s/p IM nailing of L hip and ORIF of humerus.  4/25- s/p Procedures: 1. CPT 27245-Cephalomedullary nailing of left intertrochanteric femur fracture 2. CPT 23615-Open reduction internal fixation of left proximal humerus  Reviewed I/O's: -520 ml x 24 hours and -634 ml since admission  UOP: 1 L x 24 hours  Spoke with pt, who was sitting in recliner chair at time of visit. She reports fair appetite, consumed about 50% of breakfast this morning. Pt estimates consuming about 50% of meals, however, documented meal completion reveals 75-100%. She is consuming Ensure supplements.   PTA, pt reports good appetite, consuming 3 meals per day (Breakfast: oatmeal and coffee; Lunch: soup or sandwich; Dinner: meat, starch, and vegetable).   Pt reports history of distant wt loss, however, unable to provide further details. Per pt, UBW is around 110#.   Plan to d/c to SNF once medically stable.   Labs reviewed: Na: 132.   NUTRITION - FOCUSED PHYSICAL EXAM:    Most Recent Value  Orbital Region  Severe depletion  Upper Arm Region  Severe depletion  Thoracic and Lumbar Region  Mild depletion  Buccal Region  Mild depletion  Temple Region  Severe depletion  Clavicle Bone Region  Moderate depletion  Clavicle and Acromion Bone Region  Moderate depletion  Scapular Bone Region  Moderate depletion  Dorsal  Hand  Severe depletion  Patellar Region  Mild depletion  Anterior Thigh Region  Mild depletion  Posterior Calf Region  Mild depletion  Edema (RD Assessment)  None  Hair  Reviewed  Eyes  Reviewed  Mouth  Reviewed  Skin  Reviewed  Nails  Reviewed       Diet Order:   Diet Order            Diet Heart Room service appropriate? Yes; Fluid consistency: Thin  Diet effective now              EDUCATION NEEDS:   No education needs have been identified at this time  Skin:  Skin Assessment: Skin Integrity Issues: Skin Integrity Issues:: Incisions Incisions: cloed lt leg, closed lt arm  Last BM:  03/15/20  Height:   Ht Readings from Last 1 Encounters:  03/12/20 5\' 2"  (1.575 m)    Weight:   Wt Readings from Last 1 Encounters:  03/12/20 49.9 kg    Ideal Body Weight:  50 kg  BMI:  Body mass index is 20.12 kg/m.  Estimated Nutritional Needs:   Kcal:  1300-1500  Protein:  70-80 grams  Fluid:  > 1.5 L/day    03/14/20, RD, LDN, CDCES Registered Dietitian II Certified Diabetes Care and Education Specialist Please refer to [facility] for RD and/or RD on-call/weekend/after hours pager

## 2020-03-16 NOTE — Telephone Encounter (Signed)
On 03/16/2020 someone from [facility] in [place] called to get dates of patients PNA and Flu vaccines. She let me know that patient would be discharge from hospital their facility.

## 2020-03-16 NOTE — Telephone Encounter (Signed)
Received notification today that patient was being discharged from hospital and going to [facility] in [place]. Will not need to do Hospital follow up until patient leaves facility.

## 2020-03-19 DIAGNOSIS — W19XXXA Unspecified fall, initial encounter: Secondary | ICD-10-CM

## 2020-03-22 DIAGNOSIS — S42202D Unspecified fracture of upper end of left humerus, subsequent encounter for fracture with routine healing: Secondary | ICD-10-CM | POA: Diagnosis not present

## 2020-03-22 DIAGNOSIS — R262 Difficulty in walking, not elsewhere classified: Secondary | ICD-10-CM | POA: Diagnosis not present

## 2020-03-22 DIAGNOSIS — D649 Anemia, unspecified: Secondary | ICD-10-CM | POA: Diagnosis not present

## 2020-03-22 DIAGNOSIS — S72002D Fracture of unspecified part of neck of left femur, subsequent encounter for closed fracture with routine healing: Secondary | ICD-10-CM | POA: Diagnosis not present

## 2020-03-30 DIAGNOSIS — S72002A Fracture of unspecified part of neck of left femur, initial encounter for closed fracture: Secondary | ICD-10-CM

## 2020-04-03 DIAGNOSIS — S42202D Unspecified fracture of upper end of left humerus, subsequent encounter for fracture with routine healing: Secondary | ICD-10-CM | POA: Diagnosis not present

## 2020-04-03 DIAGNOSIS — S72142D Displaced intertrochanteric fracture of left femur, subsequent encounter for closed fracture with routine healing: Secondary | ICD-10-CM | POA: Diagnosis not present

## 2020-04-06 ENCOUNTER — Inpatient Hospital Stay: Payer: Medicare Other | Admitting: Internal Medicine

## 2020-04-10 ENCOUNTER — Ambulatory Visit: Payer: Medicare Other | Admitting: Internal Medicine

## 2020-04-10 DIAGNOSIS — E785 Hyperlipidemia, unspecified: Secondary | ICD-10-CM | POA: Diagnosis not present

## 2020-04-10 DIAGNOSIS — M80022D Age-related osteoporosis with current pathological fracture, left humerus, subsequent encounter for fracture with routine healing: Secondary | ICD-10-CM | POA: Diagnosis not present

## 2020-04-10 DIAGNOSIS — M800AXD Age-related osteoporosis with current pathological fracture, other site, subsequent encounter for fracture with routine healing: Secondary | ICD-10-CM | POA: Diagnosis not present

## 2020-04-10 DIAGNOSIS — W1830XD Fall on same level, unspecified, subsequent encounter: Secondary | ICD-10-CM | POA: Diagnosis not present

## 2020-04-10 DIAGNOSIS — I739 Peripheral vascular disease, unspecified: Secondary | ICD-10-CM | POA: Diagnosis not present

## 2020-04-10 DIAGNOSIS — M80052D Age-related osteoporosis with current pathological fracture, left femur, subsequent encounter for fracture with routine healing: Secondary | ICD-10-CM | POA: Diagnosis not present

## 2020-04-10 DIAGNOSIS — I10 Essential (primary) hypertension: Secondary | ICD-10-CM | POA: Diagnosis not present

## 2020-04-10 DIAGNOSIS — R7303 Prediabetes: Secondary | ICD-10-CM | POA: Diagnosis not present

## 2020-04-12 ENCOUNTER — Other Ambulatory Visit: Payer: Self-pay

## 2020-04-12 ENCOUNTER — Ambulatory Visit (INDEPENDENT_AMBULATORY_CARE_PROVIDER_SITE_OTHER): Payer: Medicare Other | Admitting: Internal Medicine

## 2020-04-12 DIAGNOSIS — R7302 Impaired glucose tolerance (oral): Secondary | ICD-10-CM | POA: Diagnosis not present

## 2020-04-12 DIAGNOSIS — H903 Sensorineural hearing loss, bilateral: Secondary | ICD-10-CM

## 2020-04-12 DIAGNOSIS — S72142D Displaced intertrochanteric fracture of left femur, subsequent encounter for closed fracture with routine healing: Secondary | ICD-10-CM

## 2020-04-12 DIAGNOSIS — I10 Essential (primary) hypertension: Secondary | ICD-10-CM

## 2020-04-12 DIAGNOSIS — M8000XD Age-related osteoporosis with current pathological fracture, unspecified site, subsequent encounter for fracture with routine healing: Secondary | ICD-10-CM

## 2020-04-12 DIAGNOSIS — S42292D Other displaced fracture of upper end of left humerus, subsequent encounter for fracture with routine healing: Secondary | ICD-10-CM

## 2020-04-12 DIAGNOSIS — E7800 Pure hypercholesterolemia, unspecified: Secondary | ICD-10-CM

## 2020-04-13 ENCOUNTER — Telehealth: Payer: Self-pay | Admitting: Internal Medicine

## 2020-04-13 DIAGNOSIS — W1830XD Fall on same level, unspecified, subsequent encounter: Secondary | ICD-10-CM | POA: Diagnosis not present

## 2020-04-13 DIAGNOSIS — R7303 Prediabetes: Secondary | ICD-10-CM | POA: Diagnosis not present

## 2020-04-13 DIAGNOSIS — M80022D Age-related osteoporosis with current pathological fracture, left humerus, subsequent encounter for fracture with routine healing: Secondary | ICD-10-CM | POA: Diagnosis not present

## 2020-04-13 DIAGNOSIS — E785 Hyperlipidemia, unspecified: Secondary | ICD-10-CM | POA: Diagnosis not present

## 2020-04-13 DIAGNOSIS — I739 Peripheral vascular disease, unspecified: Secondary | ICD-10-CM | POA: Diagnosis not present

## 2020-04-13 DIAGNOSIS — I10 Essential (primary) hypertension: Secondary | ICD-10-CM | POA: Diagnosis not present

## 2020-04-13 DIAGNOSIS — M80052D Age-related osteoporosis with current pathological fracture, left femur, subsequent encounter for fracture with routine healing: Secondary | ICD-10-CM | POA: Diagnosis not present

## 2020-04-13 DIAGNOSIS — M800AXD Age-related osteoporosis with current pathological fracture, other site, subsequent encounter for fracture with routine healing: Secondary | ICD-10-CM | POA: Diagnosis not present

## 2020-04-13 NOTE — Telephone Encounter (Signed)
OK these orders please

## 2020-04-13 NOTE — Telephone Encounter (Signed)
Left detailed message.   

## 2020-04-13 NOTE — Telephone Encounter (Signed)
Physical Therapist [facility] calling to see if they send orders for twice a week for 3 weeks and then once a week for 5 weeks if you will sign off on it, He said they need verbal approval before they can send over order and just to call back at [phone], okay to leave message

## 2020-04-14 ENCOUNTER — Encounter: Payer: Self-pay | Admitting: Internal Medicine

## 2020-04-14 DIAGNOSIS — M8000XD Age-related osteoporosis with current pathological fracture, unspecified site, subsequent encounter for fracture with routine healing: Secondary | ICD-10-CM | POA: Diagnosis not present

## 2020-04-14 DIAGNOSIS — I10 Essential (primary) hypertension: Secondary | ICD-10-CM | POA: Diagnosis not present

## 2020-04-14 DIAGNOSIS — S72142D Displaced intertrochanteric fracture of left femur, subsequent encounter for closed fracture with routine healing: Secondary | ICD-10-CM | POA: Diagnosis not present

## 2020-04-14 NOTE — Patient Instructions (Signed)
It was a pleasure to speak with you and your husband by phone today.  Home health services will be arranged.  We would like to see you in the office in 4 to 6 weeks for follow-up on hypertension, anemia related to fall and operative procedures and any other needs that come to mind.

## 2020-04-14 NOTE — Progress Notes (Signed)
   Subjective:    Patient ID: Anna Ewing, female    DOB: 1928-12-16, 86 y.o.   MRN: [id number]  HPI  86 year old Female seen today for hospitalization and post rehab center stay.  Patient was in her home on 03/12/2020.  There is a sunken den in the home.  She told her husband that she stepped down into the sunken den from the kitchen and somehow lost her balance and fell.  She fractured her left humerus and left hip.  She was taken to the hospital for urgent treatment.  After evaluation in the emergency department, she underwent humeral neck fracture and left hip repair 03/12/2020 by Dr. Jena Gauss.  Hemoglobin dropped from 11 g to 6.6 g and required 2 units of packed red blood cells.  Hemoglobin improved to 10.8 with an MCV of 89.  She has a history of hypertension treated with Norvasc 5 mg daily.  She is on Crestor for hyperlipidemia.  Hemoglobin A1c noted to be 6.3%.  At her age, we have elected not to treat her at this point in time.  She was discharged from hospital 03/16/2020 and was admitted 03/12/2020.  Due to technical difficulties, we were not able to connect virtually today.  Patient and her husband are elderly and have difficulty with that mode of communication.  We were able to continue by telephone only.  After her hospital discharge on 4/29, she was admitted to [facility] for rehabilitation.  She was discharged on May 24.  Husband says since patient has returned home that she is doing well.  Not in any significant amount of pain.  We will be signing orders for home health services.  We would like to follow-up with her in 2 to 4 weeks here when she is feeling better and ambulating better.     Review of Systems no new complaints, pain controlled.     Objective:   Physical Exam  Husband reports her blood pressure is stable      Assessment & Plan:  Status post fall at home resulting in left hip and left humerus head fracture both surgically repaired by Dr. Jena Gauss on  April 25.  After discharge on April 29 [facility] through May 24  Likely has age related osteoporosis  No previous history of falling  Essential hypertension continue amlodipine daily  Anemia secondary to fall and surgery which will need follow-up.  Plan follow-up visit in 4 to 6 weeks.  At the office.  Will discuss with patient and husband at that time whether to pursue bone density study and treatment for bone loss.  [facility] will be providing home health services.  Orders will be forthcoming to sign.

## 2020-04-18 DIAGNOSIS — M80052D Age-related osteoporosis with current pathological fracture, left femur, subsequent encounter for fracture with routine healing: Secondary | ICD-10-CM | POA: Diagnosis not present

## 2020-04-18 DIAGNOSIS — W1830XD Fall on same level, unspecified, subsequent encounter: Secondary | ICD-10-CM | POA: Diagnosis not present

## 2020-04-18 DIAGNOSIS — M80022D Age-related osteoporosis with current pathological fracture, left humerus, subsequent encounter for fracture with routine healing: Secondary | ICD-10-CM | POA: Diagnosis not present

## 2020-04-18 DIAGNOSIS — M800AXD Age-related osteoporosis with current pathological fracture, other site, subsequent encounter for fracture with routine healing: Secondary | ICD-10-CM | POA: Diagnosis not present

## 2020-04-18 DIAGNOSIS — R7303 Prediabetes: Secondary | ICD-10-CM | POA: Diagnosis not present

## 2020-04-18 DIAGNOSIS — I739 Peripheral vascular disease, unspecified: Secondary | ICD-10-CM | POA: Diagnosis not present

## 2020-04-18 DIAGNOSIS — E785 Hyperlipidemia, unspecified: Secondary | ICD-10-CM | POA: Diagnosis not present

## 2020-04-18 DIAGNOSIS — I10 Essential (primary) hypertension: Secondary | ICD-10-CM | POA: Diagnosis not present

## 2020-04-20 DIAGNOSIS — M80052D Age-related osteoporosis with current pathological fracture, left femur, subsequent encounter for fracture with routine healing: Secondary | ICD-10-CM | POA: Diagnosis not present

## 2020-04-20 DIAGNOSIS — I10 Essential (primary) hypertension: Secondary | ICD-10-CM | POA: Diagnosis not present

## 2020-04-20 DIAGNOSIS — M80022D Age-related osteoporosis with current pathological fracture, left humerus, subsequent encounter for fracture with routine healing: Secondary | ICD-10-CM | POA: Diagnosis not present

## 2020-04-20 DIAGNOSIS — W1830XD Fall on same level, unspecified, subsequent encounter: Secondary | ICD-10-CM | POA: Diagnosis not present

## 2020-04-20 DIAGNOSIS — M800AXD Age-related osteoporosis with current pathological fracture, other site, subsequent encounter for fracture with routine healing: Secondary | ICD-10-CM | POA: Diagnosis not present

## 2020-04-20 DIAGNOSIS — R7303 Prediabetes: Secondary | ICD-10-CM | POA: Diagnosis not present

## 2020-04-20 DIAGNOSIS — E785 Hyperlipidemia, unspecified: Secondary | ICD-10-CM | POA: Diagnosis not present

## 2020-04-20 DIAGNOSIS — I739 Peripheral vascular disease, unspecified: Secondary | ICD-10-CM | POA: Diagnosis not present

## 2020-04-25 DIAGNOSIS — M80022D Age-related osteoporosis with current pathological fracture, left humerus, subsequent encounter for fracture with routine healing: Secondary | ICD-10-CM | POA: Diagnosis not present

## 2020-04-25 DIAGNOSIS — E785 Hyperlipidemia, unspecified: Secondary | ICD-10-CM | POA: Diagnosis not present

## 2020-04-25 DIAGNOSIS — M80052D Age-related osteoporosis with current pathological fracture, left femur, subsequent encounter for fracture with routine healing: Secondary | ICD-10-CM | POA: Diagnosis not present

## 2020-04-25 DIAGNOSIS — M800AXD Age-related osteoporosis with current pathological fracture, other site, subsequent encounter for fracture with routine healing: Secondary | ICD-10-CM | POA: Diagnosis not present

## 2020-04-25 DIAGNOSIS — R7303 Prediabetes: Secondary | ICD-10-CM | POA: Diagnosis not present

## 2020-04-25 DIAGNOSIS — I739 Peripheral vascular disease, unspecified: Secondary | ICD-10-CM | POA: Diagnosis not present

## 2020-04-25 DIAGNOSIS — W1830XD Fall on same level, unspecified, subsequent encounter: Secondary | ICD-10-CM | POA: Diagnosis not present

## 2020-04-25 DIAGNOSIS — I10 Essential (primary) hypertension: Secondary | ICD-10-CM | POA: Diagnosis not present

## 2020-04-26 DIAGNOSIS — E785 Hyperlipidemia, unspecified: Secondary | ICD-10-CM | POA: Diagnosis not present

## 2020-04-26 DIAGNOSIS — M800AXD Age-related osteoporosis with current pathological fracture, other site, subsequent encounter for fracture with routine healing: Secondary | ICD-10-CM | POA: Diagnosis not present

## 2020-04-26 DIAGNOSIS — M80022D Age-related osteoporosis with current pathological fracture, left humerus, subsequent encounter for fracture with routine healing: Secondary | ICD-10-CM | POA: Diagnosis not present

## 2020-04-26 DIAGNOSIS — I10 Essential (primary) hypertension: Secondary | ICD-10-CM | POA: Diagnosis not present

## 2020-04-26 DIAGNOSIS — R7303 Prediabetes: Secondary | ICD-10-CM | POA: Diagnosis not present

## 2020-04-26 DIAGNOSIS — M80052D Age-related osteoporosis with current pathological fracture, left femur, subsequent encounter for fracture with routine healing: Secondary | ICD-10-CM | POA: Diagnosis not present

## 2020-04-26 DIAGNOSIS — I739 Peripheral vascular disease, unspecified: Secondary | ICD-10-CM | POA: Diagnosis not present

## 2020-04-26 DIAGNOSIS — W1830XD Fall on same level, unspecified, subsequent encounter: Secondary | ICD-10-CM | POA: Diagnosis not present

## 2020-04-27 DIAGNOSIS — M800AXD Age-related osteoporosis with current pathological fracture, other site, subsequent encounter for fracture with routine healing: Secondary | ICD-10-CM | POA: Diagnosis not present

## 2020-04-27 DIAGNOSIS — E785 Hyperlipidemia, unspecified: Secondary | ICD-10-CM | POA: Diagnosis not present

## 2020-04-27 DIAGNOSIS — W1830XD Fall on same level, unspecified, subsequent encounter: Secondary | ICD-10-CM | POA: Diagnosis not present

## 2020-04-27 DIAGNOSIS — R7303 Prediabetes: Secondary | ICD-10-CM | POA: Diagnosis not present

## 2020-04-27 DIAGNOSIS — I10 Essential (primary) hypertension: Secondary | ICD-10-CM | POA: Diagnosis not present

## 2020-04-27 DIAGNOSIS — M80022D Age-related osteoporosis with current pathological fracture, left humerus, subsequent encounter for fracture with routine healing: Secondary | ICD-10-CM | POA: Diagnosis not present

## 2020-04-27 DIAGNOSIS — I739 Peripheral vascular disease, unspecified: Secondary | ICD-10-CM | POA: Diagnosis not present

## 2020-04-27 DIAGNOSIS — M80052D Age-related osteoporosis with current pathological fracture, left femur, subsequent encounter for fracture with routine healing: Secondary | ICD-10-CM | POA: Diagnosis not present

## 2020-05-01 DIAGNOSIS — M80022D Age-related osteoporosis with current pathological fracture, left humerus, subsequent encounter for fracture with routine healing: Secondary | ICD-10-CM | POA: Diagnosis not present

## 2020-05-01 DIAGNOSIS — I739 Peripheral vascular disease, unspecified: Secondary | ICD-10-CM | POA: Diagnosis not present

## 2020-05-01 DIAGNOSIS — W1830XD Fall on same level, unspecified, subsequent encounter: Secondary | ICD-10-CM | POA: Diagnosis not present

## 2020-05-01 DIAGNOSIS — M800AXD Age-related osteoporosis with current pathological fracture, other site, subsequent encounter for fracture with routine healing: Secondary | ICD-10-CM | POA: Diagnosis not present

## 2020-05-01 DIAGNOSIS — E785 Hyperlipidemia, unspecified: Secondary | ICD-10-CM | POA: Diagnosis not present

## 2020-05-01 DIAGNOSIS — M80052D Age-related osteoporosis with current pathological fracture, left femur, subsequent encounter for fracture with routine healing: Secondary | ICD-10-CM | POA: Diagnosis not present

## 2020-05-01 DIAGNOSIS — R7303 Prediabetes: Secondary | ICD-10-CM | POA: Diagnosis not present

## 2020-05-01 DIAGNOSIS — I10 Essential (primary) hypertension: Secondary | ICD-10-CM | POA: Diagnosis not present

## 2020-05-02 DIAGNOSIS — S42202D Unspecified fracture of upper end of left humerus, subsequent encounter for fracture with routine healing: Secondary | ICD-10-CM | POA: Diagnosis not present

## 2020-05-02 DIAGNOSIS — S72142D Displaced intertrochanteric fracture of left femur, subsequent encounter for closed fracture with routine healing: Secondary | ICD-10-CM | POA: Diagnosis not present

## 2020-05-03 DIAGNOSIS — M800AXD Age-related osteoporosis with current pathological fracture, other site, subsequent encounter for fracture with routine healing: Secondary | ICD-10-CM | POA: Diagnosis not present

## 2020-05-03 DIAGNOSIS — M80052D Age-related osteoporosis with current pathological fracture, left femur, subsequent encounter for fracture with routine healing: Secondary | ICD-10-CM | POA: Diagnosis not present

## 2020-05-03 DIAGNOSIS — R7303 Prediabetes: Secondary | ICD-10-CM | POA: Diagnosis not present

## 2020-05-03 DIAGNOSIS — W1830XD Fall on same level, unspecified, subsequent encounter: Secondary | ICD-10-CM | POA: Diagnosis not present

## 2020-05-03 DIAGNOSIS — E785 Hyperlipidemia, unspecified: Secondary | ICD-10-CM | POA: Diagnosis not present

## 2020-05-03 DIAGNOSIS — I10 Essential (primary) hypertension: Secondary | ICD-10-CM | POA: Diagnosis not present

## 2020-05-03 DIAGNOSIS — M80022D Age-related osteoporosis with current pathological fracture, left humerus, subsequent encounter for fracture with routine healing: Secondary | ICD-10-CM | POA: Diagnosis not present

## 2020-05-03 DIAGNOSIS — I739 Peripheral vascular disease, unspecified: Secondary | ICD-10-CM | POA: Diagnosis not present

## 2020-05-05 DIAGNOSIS — R7303 Prediabetes: Secondary | ICD-10-CM | POA: Diagnosis not present

## 2020-05-05 DIAGNOSIS — M80052D Age-related osteoporosis with current pathological fracture, left femur, subsequent encounter for fracture with routine healing: Secondary | ICD-10-CM | POA: Diagnosis not present

## 2020-05-05 DIAGNOSIS — I739 Peripheral vascular disease, unspecified: Secondary | ICD-10-CM | POA: Diagnosis not present

## 2020-05-05 DIAGNOSIS — M80022D Age-related osteoporosis with current pathological fracture, left humerus, subsequent encounter for fracture with routine healing: Secondary | ICD-10-CM | POA: Diagnosis not present

## 2020-05-05 DIAGNOSIS — E785 Hyperlipidemia, unspecified: Secondary | ICD-10-CM | POA: Diagnosis not present

## 2020-05-05 DIAGNOSIS — M800AXD Age-related osteoporosis with current pathological fracture, other site, subsequent encounter for fracture with routine healing: Secondary | ICD-10-CM | POA: Diagnosis not present

## 2020-05-05 DIAGNOSIS — I10 Essential (primary) hypertension: Secondary | ICD-10-CM | POA: Diagnosis not present

## 2020-05-05 DIAGNOSIS — W1830XD Fall on same level, unspecified, subsequent encounter: Secondary | ICD-10-CM | POA: Diagnosis not present

## 2020-05-08 DIAGNOSIS — M80052D Age-related osteoporosis with current pathological fracture, left femur, subsequent encounter for fracture with routine healing: Secondary | ICD-10-CM | POA: Diagnosis not present

## 2020-05-08 DIAGNOSIS — M80022D Age-related osteoporosis with current pathological fracture, left humerus, subsequent encounter for fracture with routine healing: Secondary | ICD-10-CM | POA: Diagnosis not present

## 2020-05-08 DIAGNOSIS — R7303 Prediabetes: Secondary | ICD-10-CM | POA: Diagnosis not present

## 2020-05-08 DIAGNOSIS — M800AXD Age-related osteoporosis with current pathological fracture, other site, subsequent encounter for fracture with routine healing: Secondary | ICD-10-CM | POA: Diagnosis not present

## 2020-05-08 DIAGNOSIS — W1830XD Fall on same level, unspecified, subsequent encounter: Secondary | ICD-10-CM | POA: Diagnosis not present

## 2020-05-08 DIAGNOSIS — I739 Peripheral vascular disease, unspecified: Secondary | ICD-10-CM | POA: Diagnosis not present

## 2020-05-08 DIAGNOSIS — E785 Hyperlipidemia, unspecified: Secondary | ICD-10-CM | POA: Diagnosis not present

## 2020-05-08 DIAGNOSIS — I10 Essential (primary) hypertension: Secondary | ICD-10-CM | POA: Diagnosis not present

## 2020-05-09 ENCOUNTER — Telehealth: Payer: Self-pay | Admitting: Internal Medicine

## 2020-05-09 NOTE — Telephone Encounter (Signed)
Faxed signed Cert Home Health Orders for 04-10-20 to 06/08/20

## 2020-05-10 DIAGNOSIS — R7303 Prediabetes: Secondary | ICD-10-CM | POA: Diagnosis not present

## 2020-05-10 DIAGNOSIS — I10 Essential (primary) hypertension: Secondary | ICD-10-CM | POA: Diagnosis not present

## 2020-05-10 DIAGNOSIS — M80022D Age-related osteoporosis with current pathological fracture, left humerus, subsequent encounter for fracture with routine healing: Secondary | ICD-10-CM | POA: Diagnosis not present

## 2020-05-10 DIAGNOSIS — W1830XD Fall on same level, unspecified, subsequent encounter: Secondary | ICD-10-CM | POA: Diagnosis not present

## 2020-05-10 DIAGNOSIS — M80052D Age-related osteoporosis with current pathological fracture, left femur, subsequent encounter for fracture with routine healing: Secondary | ICD-10-CM | POA: Diagnosis not present

## 2020-05-10 DIAGNOSIS — I739 Peripheral vascular disease, unspecified: Secondary | ICD-10-CM | POA: Diagnosis not present

## 2020-05-10 DIAGNOSIS — E785 Hyperlipidemia, unspecified: Secondary | ICD-10-CM | POA: Diagnosis not present

## 2020-05-10 DIAGNOSIS — M800AXD Age-related osteoporosis with current pathological fracture, other site, subsequent encounter for fracture with routine healing: Secondary | ICD-10-CM | POA: Diagnosis not present

## 2020-05-11 ENCOUNTER — Telehealth: Payer: Self-pay | Admitting: Internal Medicine

## 2020-05-11 DIAGNOSIS — S72142D Displaced intertrochanteric fracture of left femur, subsequent encounter for closed fracture with routine healing: Secondary | ICD-10-CM | POA: Diagnosis not present

## 2020-05-11 DIAGNOSIS — S42292D Other displaced fracture of upper end of left humerus, subsequent encounter for fracture with routine healing: Secondary | ICD-10-CM | POA: Diagnosis not present

## 2020-05-11 NOTE — Telephone Encounter (Signed)
Faxed [facility] signed Home Health Orders to [facility] [phone], office number [phone] for OT.

## 2020-05-12 DIAGNOSIS — M80022D Age-related osteoporosis with current pathological fracture, left humerus, subsequent encounter for fracture with routine healing: Secondary | ICD-10-CM | POA: Diagnosis not present

## 2020-05-12 DIAGNOSIS — R7303 Prediabetes: Secondary | ICD-10-CM | POA: Diagnosis not present

## 2020-05-12 DIAGNOSIS — M80052D Age-related osteoporosis with current pathological fracture, left femur, subsequent encounter for fracture with routine healing: Secondary | ICD-10-CM | POA: Diagnosis not present

## 2020-05-12 DIAGNOSIS — I10 Essential (primary) hypertension: Secondary | ICD-10-CM | POA: Diagnosis not present

## 2020-05-12 DIAGNOSIS — I739 Peripheral vascular disease, unspecified: Secondary | ICD-10-CM | POA: Diagnosis not present

## 2020-05-12 DIAGNOSIS — E785 Hyperlipidemia, unspecified: Secondary | ICD-10-CM | POA: Diagnosis not present

## 2020-05-12 DIAGNOSIS — W1830XD Fall on same level, unspecified, subsequent encounter: Secondary | ICD-10-CM | POA: Diagnosis not present

## 2020-05-12 DIAGNOSIS — M800AXD Age-related osteoporosis with current pathological fracture, other site, subsequent encounter for fracture with routine healing: Secondary | ICD-10-CM | POA: Diagnosis not present

## 2020-05-15 DIAGNOSIS — M80052D Age-related osteoporosis with current pathological fracture, left femur, subsequent encounter for fracture with routine healing: Secondary | ICD-10-CM | POA: Diagnosis not present

## 2020-05-15 DIAGNOSIS — I10 Essential (primary) hypertension: Secondary | ICD-10-CM | POA: Diagnosis not present

## 2020-05-15 DIAGNOSIS — M80022D Age-related osteoporosis with current pathological fracture, left humerus, subsequent encounter for fracture with routine healing: Secondary | ICD-10-CM | POA: Diagnosis not present

## 2020-05-15 DIAGNOSIS — I739 Peripheral vascular disease, unspecified: Secondary | ICD-10-CM | POA: Diagnosis not present

## 2020-05-15 DIAGNOSIS — R7303 Prediabetes: Secondary | ICD-10-CM | POA: Diagnosis not present

## 2020-05-15 DIAGNOSIS — M800AXD Age-related osteoporosis with current pathological fracture, other site, subsequent encounter for fracture with routine healing: Secondary | ICD-10-CM | POA: Diagnosis not present

## 2020-05-15 DIAGNOSIS — E785 Hyperlipidemia, unspecified: Secondary | ICD-10-CM | POA: Diagnosis not present

## 2020-05-15 DIAGNOSIS — W1830XD Fall on same level, unspecified, subsequent encounter: Secondary | ICD-10-CM | POA: Diagnosis not present

## 2020-05-17 DIAGNOSIS — M80022D Age-related osteoporosis with current pathological fracture, left humerus, subsequent encounter for fracture with routine healing: Secondary | ICD-10-CM | POA: Diagnosis not present

## 2020-05-17 DIAGNOSIS — M800AXD Age-related osteoporosis with current pathological fracture, other site, subsequent encounter for fracture with routine healing: Secondary | ICD-10-CM | POA: Diagnosis not present

## 2020-05-17 DIAGNOSIS — I10 Essential (primary) hypertension: Secondary | ICD-10-CM | POA: Diagnosis not present

## 2020-05-17 DIAGNOSIS — W1830XD Fall on same level, unspecified, subsequent encounter: Secondary | ICD-10-CM | POA: Diagnosis not present

## 2020-05-17 DIAGNOSIS — I739 Peripheral vascular disease, unspecified: Secondary | ICD-10-CM | POA: Diagnosis not present

## 2020-05-17 DIAGNOSIS — R7303 Prediabetes: Secondary | ICD-10-CM | POA: Diagnosis not present

## 2020-05-17 DIAGNOSIS — M80052D Age-related osteoporosis with current pathological fracture, left femur, subsequent encounter for fracture with routine healing: Secondary | ICD-10-CM | POA: Diagnosis not present

## 2020-05-17 DIAGNOSIS — E785 Hyperlipidemia, unspecified: Secondary | ICD-10-CM | POA: Diagnosis not present

## 2020-05-18 DIAGNOSIS — I739 Peripheral vascular disease, unspecified: Secondary | ICD-10-CM | POA: Diagnosis not present

## 2020-05-18 DIAGNOSIS — M80022D Age-related osteoporosis with current pathological fracture, left humerus, subsequent encounter for fracture with routine healing: Secondary | ICD-10-CM | POA: Diagnosis not present

## 2020-05-18 DIAGNOSIS — R7303 Prediabetes: Secondary | ICD-10-CM | POA: Diagnosis not present

## 2020-05-18 DIAGNOSIS — W1830XD Fall on same level, unspecified, subsequent encounter: Secondary | ICD-10-CM | POA: Diagnosis not present

## 2020-05-18 DIAGNOSIS — I10 Essential (primary) hypertension: Secondary | ICD-10-CM | POA: Diagnosis not present

## 2020-05-18 DIAGNOSIS — M800AXD Age-related osteoporosis with current pathological fracture, other site, subsequent encounter for fracture with routine healing: Secondary | ICD-10-CM | POA: Diagnosis not present

## 2020-05-18 DIAGNOSIS — E785 Hyperlipidemia, unspecified: Secondary | ICD-10-CM | POA: Diagnosis not present

## 2020-05-18 DIAGNOSIS — M80052D Age-related osteoporosis with current pathological fracture, left femur, subsequent encounter for fracture with routine healing: Secondary | ICD-10-CM | POA: Diagnosis not present

## 2020-05-23 DIAGNOSIS — R7303 Prediabetes: Secondary | ICD-10-CM | POA: Diagnosis not present

## 2020-05-23 DIAGNOSIS — M80052D Age-related osteoporosis with current pathological fracture, left femur, subsequent encounter for fracture with routine healing: Secondary | ICD-10-CM | POA: Diagnosis not present

## 2020-05-23 DIAGNOSIS — M80022D Age-related osteoporosis with current pathological fracture, left humerus, subsequent encounter for fracture with routine healing: Secondary | ICD-10-CM | POA: Diagnosis not present

## 2020-05-23 DIAGNOSIS — I739 Peripheral vascular disease, unspecified: Secondary | ICD-10-CM | POA: Diagnosis not present

## 2020-05-23 DIAGNOSIS — I10 Essential (primary) hypertension: Secondary | ICD-10-CM | POA: Diagnosis not present

## 2020-05-23 DIAGNOSIS — M800AXD Age-related osteoporosis with current pathological fracture, other site, subsequent encounter for fracture with routine healing: Secondary | ICD-10-CM | POA: Diagnosis not present

## 2020-05-23 DIAGNOSIS — W1830XD Fall on same level, unspecified, subsequent encounter: Secondary | ICD-10-CM | POA: Diagnosis not present

## 2020-05-23 DIAGNOSIS — E785 Hyperlipidemia, unspecified: Secondary | ICD-10-CM | POA: Diagnosis not present

## 2020-05-24 DIAGNOSIS — M80022D Age-related osteoporosis with current pathological fracture, left humerus, subsequent encounter for fracture with routine healing: Secondary | ICD-10-CM | POA: Diagnosis not present

## 2020-05-24 DIAGNOSIS — I739 Peripheral vascular disease, unspecified: Secondary | ICD-10-CM | POA: Diagnosis not present

## 2020-05-24 DIAGNOSIS — E785 Hyperlipidemia, unspecified: Secondary | ICD-10-CM | POA: Diagnosis not present

## 2020-05-24 DIAGNOSIS — R7303 Prediabetes: Secondary | ICD-10-CM | POA: Diagnosis not present

## 2020-05-24 DIAGNOSIS — M80052D Age-related osteoporosis with current pathological fracture, left femur, subsequent encounter for fracture with routine healing: Secondary | ICD-10-CM | POA: Diagnosis not present

## 2020-05-24 DIAGNOSIS — W1830XD Fall on same level, unspecified, subsequent encounter: Secondary | ICD-10-CM | POA: Diagnosis not present

## 2020-05-24 DIAGNOSIS — I10 Essential (primary) hypertension: Secondary | ICD-10-CM | POA: Diagnosis not present

## 2020-05-24 DIAGNOSIS — M800AXD Age-related osteoporosis with current pathological fracture, other site, subsequent encounter for fracture with routine healing: Secondary | ICD-10-CM | POA: Diagnosis not present

## 2020-05-30 ENCOUNTER — Other Ambulatory Visit: Payer: Medicare Other | Admitting: Internal Medicine

## 2020-05-30 DIAGNOSIS — E785 Hyperlipidemia, unspecified: Secondary | ICD-10-CM | POA: Diagnosis not present

## 2020-05-30 DIAGNOSIS — M800AXD Age-related osteoporosis with current pathological fracture, other site, subsequent encounter for fracture with routine healing: Secondary | ICD-10-CM | POA: Diagnosis not present

## 2020-05-30 DIAGNOSIS — I10 Essential (primary) hypertension: Secondary | ICD-10-CM | POA: Diagnosis not present

## 2020-05-30 DIAGNOSIS — M80052D Age-related osteoporosis with current pathological fracture, left femur, subsequent encounter for fracture with routine healing: Secondary | ICD-10-CM | POA: Diagnosis not present

## 2020-05-30 DIAGNOSIS — R7303 Prediabetes: Secondary | ICD-10-CM | POA: Diagnosis not present

## 2020-05-30 DIAGNOSIS — W1830XD Fall on same level, unspecified, subsequent encounter: Secondary | ICD-10-CM | POA: Diagnosis not present

## 2020-05-30 DIAGNOSIS — I739 Peripheral vascular disease, unspecified: Secondary | ICD-10-CM | POA: Diagnosis not present

## 2020-05-30 DIAGNOSIS — M80022D Age-related osteoporosis with current pathological fracture, left humerus, subsequent encounter for fracture with routine healing: Secondary | ICD-10-CM | POA: Diagnosis not present

## 2020-06-01 ENCOUNTER — Encounter: Payer: Medicare Other | Admitting: Internal Medicine

## 2020-06-01 DIAGNOSIS — I739 Peripheral vascular disease, unspecified: Secondary | ICD-10-CM | POA: Diagnosis not present

## 2020-06-01 DIAGNOSIS — W1830XD Fall on same level, unspecified, subsequent encounter: Secondary | ICD-10-CM | POA: Diagnosis not present

## 2020-06-01 DIAGNOSIS — I10 Essential (primary) hypertension: Secondary | ICD-10-CM | POA: Diagnosis not present

## 2020-06-01 DIAGNOSIS — M80052D Age-related osteoporosis with current pathological fracture, left femur, subsequent encounter for fracture with routine healing: Secondary | ICD-10-CM | POA: Diagnosis not present

## 2020-06-01 DIAGNOSIS — E785 Hyperlipidemia, unspecified: Secondary | ICD-10-CM | POA: Diagnosis not present

## 2020-06-01 DIAGNOSIS — R7303 Prediabetes: Secondary | ICD-10-CM | POA: Diagnosis not present

## 2020-06-01 DIAGNOSIS — M800AXD Age-related osteoporosis with current pathological fracture, other site, subsequent encounter for fracture with routine healing: Secondary | ICD-10-CM | POA: Diagnosis not present

## 2020-06-01 DIAGNOSIS — M80022D Age-related osteoporosis with current pathological fracture, left humerus, subsequent encounter for fracture with routine healing: Secondary | ICD-10-CM | POA: Diagnosis not present

## 2020-06-07 DIAGNOSIS — R7303 Prediabetes: Secondary | ICD-10-CM | POA: Diagnosis not present

## 2020-06-07 DIAGNOSIS — W1830XD Fall on same level, unspecified, subsequent encounter: Secondary | ICD-10-CM | POA: Diagnosis not present

## 2020-06-07 DIAGNOSIS — I739 Peripheral vascular disease, unspecified: Secondary | ICD-10-CM | POA: Diagnosis not present

## 2020-06-07 DIAGNOSIS — M80022D Age-related osteoporosis with current pathological fracture, left humerus, subsequent encounter for fracture with routine healing: Secondary | ICD-10-CM | POA: Diagnosis not present

## 2020-06-07 DIAGNOSIS — E785 Hyperlipidemia, unspecified: Secondary | ICD-10-CM | POA: Diagnosis not present

## 2020-06-07 DIAGNOSIS — M80052D Age-related osteoporosis with current pathological fracture, left femur, subsequent encounter for fracture with routine healing: Secondary | ICD-10-CM | POA: Diagnosis not present

## 2020-06-07 DIAGNOSIS — M800AXD Age-related osteoporosis with current pathological fracture, other site, subsequent encounter for fracture with routine healing: Secondary | ICD-10-CM | POA: Diagnosis not present

## 2020-06-07 DIAGNOSIS — I10 Essential (primary) hypertension: Secondary | ICD-10-CM | POA: Diagnosis not present

## 2020-06-08 DIAGNOSIS — E785 Hyperlipidemia, unspecified: Secondary | ICD-10-CM | POA: Diagnosis not present

## 2020-06-08 DIAGNOSIS — W1830XD Fall on same level, unspecified, subsequent encounter: Secondary | ICD-10-CM | POA: Diagnosis not present

## 2020-06-08 DIAGNOSIS — M80022D Age-related osteoporosis with current pathological fracture, left humerus, subsequent encounter for fracture with routine healing: Secondary | ICD-10-CM | POA: Diagnosis not present

## 2020-06-08 DIAGNOSIS — M800AXD Age-related osteoporosis with current pathological fracture, other site, subsequent encounter for fracture with routine healing: Secondary | ICD-10-CM | POA: Diagnosis not present

## 2020-06-08 DIAGNOSIS — I739 Peripheral vascular disease, unspecified: Secondary | ICD-10-CM | POA: Diagnosis not present

## 2020-06-08 DIAGNOSIS — M80052D Age-related osteoporosis with current pathological fracture, left femur, subsequent encounter for fracture with routine healing: Secondary | ICD-10-CM | POA: Diagnosis not present

## 2020-06-08 DIAGNOSIS — R7303 Prediabetes: Secondary | ICD-10-CM | POA: Diagnosis not present

## 2020-06-08 DIAGNOSIS — I10 Essential (primary) hypertension: Secondary | ICD-10-CM | POA: Diagnosis not present

## 2020-06-28 ENCOUNTER — Other Ambulatory Visit: Payer: Self-pay | Admitting: Internal Medicine

## 2020-06-28 NOTE — Telephone Encounter (Signed)
Refill x 90 days only 

## 2020-08-01 ENCOUNTER — Telehealth: Payer: Self-pay | Admitting: Internal Medicine

## 2020-08-01 NOTE — Telephone Encounter (Signed)
Called Natalia Leatherwood back and gave her Dr Jena Gauss name, she had already found name and number and left a message at office.

## 2020-08-01 NOTE — Telephone Encounter (Signed)
Natalia Leatherwood (Daughter) [phone]  Natalia Leatherwood called to say that her mother fell over the weekend on her left arm, it is bruised and she is keeping ice on it. This is the same arm that she injured back in May. I did ask about ortho, she was not sure who the ortho doctor was, she is going to check with her brother.

## 2020-08-01 NOTE — Telephone Encounter (Signed)
The doctor that took care of her in the hospital when she fell was Dr. Caryn Bee Haddix  [phone]

## 2020-08-04 DIAGNOSIS — S40022A Contusion of left upper arm, initial encounter: Secondary | ICD-10-CM | POA: Diagnosis not present

## 2020-08-04 DIAGNOSIS — S52602A Unspecified fracture of lower end of left ulna, initial encounter for closed fracture: Secondary | ICD-10-CM | POA: Diagnosis not present

## 2020-08-04 DIAGNOSIS — W19XXXA Unspecified fall, initial encounter: Secondary | ICD-10-CM | POA: Diagnosis not present

## 2020-08-14 DIAGNOSIS — S52022A Displaced fracture of olecranon process without intraarticular extension of left ulna, initial encounter for closed fracture: Secondary | ICD-10-CM | POA: Diagnosis not present

## 2020-08-17 ENCOUNTER — Ambulatory Visit (INDEPENDENT_AMBULATORY_CARE_PROVIDER_SITE_OTHER): Payer: Medicare Other | Admitting: Internal Medicine

## 2020-08-17 ENCOUNTER — Encounter: Payer: Self-pay | Admitting: Internal Medicine

## 2020-08-17 ENCOUNTER — Other Ambulatory Visit: Payer: Self-pay

## 2020-08-17 VITALS — BP 130/80 | HR 104 | Temp 98.0°F | Ht 59.5 in | Wt 106.0 lb

## 2020-08-17 DIAGNOSIS — Z23 Encounter for immunization: Secondary | ICD-10-CM | POA: Diagnosis not present

## 2020-08-17 DIAGNOSIS — R7302 Impaired glucose tolerance (oral): Secondary | ICD-10-CM

## 2020-08-17 DIAGNOSIS — Z0000 Encounter for general adult medical examination without abnormal findings: Secondary | ICD-10-CM

## 2020-08-17 DIAGNOSIS — H903 Sensorineural hearing loss, bilateral: Secondary | ICD-10-CM

## 2020-08-17 DIAGNOSIS — S72142D Displaced intertrochanteric fracture of left femur, subsequent encounter for closed fracture with routine healing: Secondary | ICD-10-CM

## 2020-08-17 DIAGNOSIS — I10 Essential (primary) hypertension: Secondary | ICD-10-CM | POA: Diagnosis not present

## 2020-08-17 DIAGNOSIS — E7800 Pure hypercholesterolemia, unspecified: Secondary | ICD-10-CM

## 2020-08-17 DIAGNOSIS — M8000XD Age-related osteoporosis with current pathological fracture, unspecified site, subsequent encounter for fracture with routine healing: Secondary | ICD-10-CM | POA: Diagnosis not present

## 2020-08-17 DIAGNOSIS — E559 Vitamin D deficiency, unspecified: Secondary | ICD-10-CM

## 2020-08-17 DIAGNOSIS — Z8669 Personal history of other diseases of the nervous system and sense organs: Secondary | ICD-10-CM

## 2020-08-17 DIAGNOSIS — I350 Nonrheumatic aortic (valve) stenosis: Secondary | ICD-10-CM

## 2020-08-17 MED ORDER — IBANDRONATE SODIUM 150 MG PO TABS
150.0000 mg | ORAL_TABLET | ORAL | 1 refills | Status: DC
Start: 2020-08-17 — End: 2021-05-01

## 2020-08-17 MED ORDER — ERGOCALCIFEROL 1.25 MG (50000 UT) PO CAPS: ORAL_CAPSULE | ORAL | 3 refills | Status: DC

## 2020-08-17 NOTE — Patient Instructions (Addendum)
Flu vaccine today I have placed her on Boniva monthly and high dose Vitamin D weekly. Labs drawn and pending.  Return in 1 year or as needed.

## 2020-08-17 NOTE — Progress Notes (Signed)
Subjective:    Patient ID: Anna Ewing, female    DOB: [birth year], 86 y.o.   MRN: [id number]  HPI 86 year old Female  Had closed left hip fracture in April requiring surgery by Dr. Jena Gauss.  She has recovered well.    Currently taking Crestor 5 mg daily for hyperlipidemia.  History of anxiety.  History of hearing loss with bilateral hearing aids.  She stopped taking allergy injections in 2017.  History of hypertension, hyperlipidemia, allergic rhinitis, migraine headaches, glucose intolerance, bilateral hearing loss, irritable bowel syndrome and osteoporosis.  Glucose intolerance is diet controlled.  Recent hemoglobin A1c 6.2%.  History of urticaria which seems to be aggravated by stress.  Had recurrence in 2013.  Had positive ANA in 2002 with negative anti-DNA.  She was seen by rheumatologist who did not think she had connective tissue disease.  Has had mild hyponatremia as low as 131.  This is asymptomatic and have not seen medications that would contribute to this.  History of fractured right wrist 1994, lymphangitis of left upper extremity 1997, TAH/BSO for uterine prolapse.  Cystocele and rectocele repair at [facility] in 2002.  Sinus surgery May 2012.  She saw Dr. Lenoria Farrier at  [facility] regarding intermittent right hearing loss several years ago.  Subsequently was seen by neurologist and had MRI of the brain which showed dural enhancement.  It was not clear what was causing that condition.  She had a negative spinal tap and a negative SPEP.  Chest x-ray raised the possibility of sarcoidosis but limited CT did not show active sarcoidosis.  History of colonoscopy 2009 by Dr. Matthias Hughs showing diverticulosis.  He recommended she should not need any further colonoscopies at her age.  History of cataract extraction right eye November 2014 by Dr. Vonna Kotyk.  Dr. Precious Bard is her optometrist in [place] and she sees him regularly.  Social history: She is a retired Haematologist.   Husband is retired from [employer].  She has a high school education.  She has a son and a daughter.  She is a former smoker.  Does not consume alcohol.  Family history: Father died at age 92 of heart failure.  Mother died at age 64 with history of breast cancer that metastasized to the bones as well as congestive heart failure.    Review of Systems Hx of cardiac murmur. Echo done 2019 showed moderate to severe aortic stenosis with gradient 53mm Hg.  History of bilateral hearing loss     Objective:   Physical Exam Hard of hearing and wears hearing aids.  Skin warm and dry.  Nodes none.  TMs are clear.  Neck is supple without JVD thyromegaly or carotid bruits.  Chest is clear to auscultation.  Cardiac exam: 2/6 systolic ejection murmur.  Abdomen soft nondistended without hepatosplenomegaly masses or tenderness.  Extremities without pitting edema.  Neuro no gross focal deficits on brief neurological exam.  Systolic murmur evaluated by echocardiogram and thought to be aortic stenosis.  She is not symptomatic.  We will continue to follow.  Essential hypertension stable just on amlodipine  History of impaired glucose tolerance  Bilateral hearing loss treated with hearing aids  Hyperlipidemia stable on low-dose Crestor  History of irritable bowel syndrome  History of infrequent migraine headaches  Mild memory loss I think due to age  History of hip fracture-have placed her on Boniva monthly.  Plan: Continue with current medications.  Return in 1 year or as needed.  Family is supportive.  Subjective:   Patient presents for Medicare Annual/Subsequent preventive examination.  Review Past Medical/Family/Social: See above   Risk Factors  Current exercise habits: Not a lot of exercise recovering from hip fracture Dietary issues discussed: Low-fat low carbohydrate  Cardiac risk factors:  Depression Screen  (Note: if answer to either of the following is "Yes", a more complete depression  screening is indicated)   Over the past two weeks, have you felt down, depressed or hopeless? No  Over the past two weeks, have you felt little interest or pleasure in doing things? No Have you lost interest or pleasure in daily life? No Do you often feel hopeless? No Do you cry easily over simple problems? No   Activities of Daily Living  In your present state of health, do you have any difficulty performing the following activities?:   Driving?  Yes-not driving  Managing money? No  Feeding yourself? No  Getting from bed to chair? No  Climbing a flight of stairs? No  Preparing food and eating?: No  Bathing or showering? No  Getting dressed: No  Getting to the toilet? No  Using the toilet:No  Moving around from place to place: No  In the past year have you fallen or had a near fall?:  Yes Are you sexually active? No  Do you have more than one partner? No   Hearing Difficulties: No  Do you often ask people to speak up or repeat themselves? No  Do you experience ringing or noises in your ears?  Sometimes Do you have difficulty understanding soft or whispered voices? No  Do you feel that you have a problem with memory?  Sometimes do you often misplace items?  Sometimes   Home Safety:  Do you have a smoke alarm at your residence? Yes Do you have grab bars in the bathroom?  Yes Do you have throw rugs in your house?  Noted   Cognitive Testing  Alert? Yes Normal Appearance?Yes  Oriented to person? Yes Place? Yes  Time? Yes  Recall of three objects? Yes  Can perform simple calculations? Yes  Displays appropriate judgment?Yes  Can read the correct time from a watch face?Yes   List the Names of Other Physician/Practitioners you currently use:  See referral list for the physicians patient is currently seeing.     Review of Systems: See above   Objective:     General appearance: Appears stated age and frail Head: Normocephalic, without obvious abnormality, atraumatic   Eyes: conj clear, EOMi PEERLA  Ears: normal TM's and external ear canals both ears  Nose: Nares normal. Septum midline. Mucosa normal. No drainage or sinus tenderness.  Throat: lips, mucosa, and tongue normal; teeth and gums normal  Neck: no adenopathy, no carotid bruit, no JVD, supple, symmetrical, trachea midline and thyroid not enlarged, symmetric, no tenderness/mass/nodules  No CVA tenderness.  Lungs: clear to auscultation bilaterally  Breasts: normal appearance, no masses or tenderness  Heart: regular rate and rhythm murmur consistent with aortic stenosis noted Abdomen: soft, non-tender; bowel sounds normal; no masses, no organomegaly  Musculoskeletal: ROM normal in all joints, no crepitus, no deformity, Normal muscle strengthen. Back  is symmetric, no curvature. Skin: Skin color, texture, turgor normal. No rashes or lesions  Lymph nodes: Cervical, supraclavicular, and axillary nodes normal.  Neurologic: CN 2 -12 Normal, Normal symmetric reflexes. Normal coordination and gait  Psych: Alert & Oriented x 3, Mood appear stable.    Assessment:    Annual wellness medicare exam  Plan:    During the course of the visit the patient was educated and counseled about appropriate screening and preventive services including:   See above     Patient Instructions (the written plan) was given to the patient.  Medicare Attestation  I have personally reviewed:  The patient's medical and social history  Their use of alcohol, tobacco or illicit drugs  Their current medications and supplements  The patient's functional ability including ADLs,fall risks, home safety risks, cognitive, and hearing and visual impairment  Diet and physical activities  Evidence for depression or mood disorders  The patient's weight, height, BMI, and visual acuity have been recorded in the chart. I have made referrals, counseling, and provided education to the patient based on review of the above and I have  provided the patient with a written personalized care plan for preventive services.            Assessment & Plan:  Hip fracture-have placed her on monthly Boniva and weekly vitamin D  Aortic stenosis-continue to follow  Hearing loss-wears bilateral hearing aids  Hyperlipidemia treated with Crestor  History of migraine headaches-infrequent  Essential hypertension treated with amlodipine  Plan: Return in 1 year or as needed

## 2020-08-18 LAB — COMPLETE METABOLIC PANEL WITH GFR
AG Ratio: 1.4 (calc) (ref 1.0–2.5)
ALT: 18 U/L (ref 6–29)
AST: 30 U/L (ref 10–35)
Albumin: 4.4 g/dL (ref 3.6–5.1)
Alkaline phosphatase (APISO): 61 U/L (ref 37–153)
BUN/Creatinine Ratio: 24 (calc) — ABNORMAL HIGH (ref 6–22)
BUN: 12 mg/dL (ref 7–25)
CO2: 24 mmol/L (ref 20–32)
Calcium: 10.1 mg/dL (ref 8.6–10.4)
Chloride: 101 mmol/L (ref 98–110)
Creat: 0.51 mg/dL — ABNORMAL LOW (ref 0.60–0.88)
GFR, Est African American: 99 mL/min/{1.73_m2} (ref >=60)
GFR, Est Non African American: 85 mL/min/{1.73_m2} (ref >=60)
Globulin: 3.2 g/dL (calc) (ref 1.9–3.7)
Glucose, Bld: 105 mg/dL — ABNORMAL HIGH (ref 65–99)
Potassium: 4.3 mmol/L (ref 3.5–5.3)
Sodium: 135 mmol/L (ref 135–146)
Total Bilirubin: 0.6 mg/dL (ref 0.2–1.2)
Total Protein: 7.6 g/dL (ref 6.1–8.1)

## 2020-08-18 LAB — CBC WITH DIFFERENTIAL/PLATELET
Absolute Monocytes: 784 cells/uL (ref 200–950)
Basophils Absolute: 52 cells/uL (ref 0–200)
Basophils Relative: 0.7 %
Eosinophils Absolute: 259 cells/uL (ref 15–500)
Eosinophils Relative: 3.5 %
HCT: 35 % (ref 35.0–45.0)
Hemoglobin: 11.8 g/dL (ref 11.7–15.5)
Lymphs Abs: 2176 cells/uL (ref 850–3900)
MCH: 31.1 pg (ref 27.0–33.0)
MCHC: 33.7 g/dL (ref 32.0–36.0)
MCV: 92.1 fL (ref 80.0–100.0)
MPV: 9 fL (ref 7.5–12.5)
Monocytes Relative: 10.6 %
Neutro Abs: 4129 cells/uL (ref 1500–7800)
Neutrophils Relative %: 55.8 %
Platelets: 342 10*3/uL (ref 140–400)
RBC: 3.8 10*6/uL (ref 3.80–5.10)
RDW: 13 % (ref 11.0–15.0)
Total Lymphocyte: 29.4 %
WBC: 7.4 10*3/uL (ref 3.8–10.8)

## 2020-08-18 LAB — MICROALBUMIN / CREATININE URINE RATIO
Creatinine, Urine: 21 mg/dL (ref 20–275)
Microalb Creat Ratio: 43 mcg/mg creat — ABNORMAL HIGH (ref <30)
Microalb, Ur: 0.9 mg/dL

## 2020-08-18 LAB — TSH: TSH: 1.97 mIU/L (ref 0.40–4.50)

## 2020-08-18 LAB — LIPID PANEL
Cholesterol: 184 mg/dL (ref <200)
HDL: 71 mg/dL (ref >=50)
LDL Cholesterol (Calc): 95 mg/dL (calc)
Non-HDL Cholesterol (Calc): 113 mg/dL (calc) (ref <130)
Total CHOL/HDL Ratio: 2.6 (calc) (ref <5.0)
Triglycerides: 89 mg/dL (ref <150)

## 2020-08-18 LAB — HEMOGLOBIN A1C
Hgb A1c MFr Bld: 6.2 % of total Hgb — ABNORMAL HIGH (ref <5.7)
Mean Plasma Glucose: 131 (calc)
eAG (mmol/L): 7.3 (calc)

## 2020-09-05 DIAGNOSIS — S42202D Unspecified fracture of upper end of left humerus, subsequent encounter for fracture with routine healing: Secondary | ICD-10-CM | POA: Diagnosis not present

## 2020-09-05 DIAGNOSIS — S72142D Displaced intertrochanteric fracture of left femur, subsequent encounter for closed fracture with routine healing: Secondary | ICD-10-CM | POA: Diagnosis not present

## 2020-09-05 DIAGNOSIS — S52022D Displaced fracture of olecranon process without intraarticular extension of left ulna, subsequent encounter for closed fracture with routine healing: Secondary | ICD-10-CM | POA: Diagnosis not present

## 2020-09-21 ENCOUNTER — Other Ambulatory Visit: Payer: Self-pay | Admitting: Internal Medicine

## 2021-01-25 ENCOUNTER — Other Ambulatory Visit: Payer: Self-pay

## 2021-01-25 ENCOUNTER — Telehealth (INDEPENDENT_AMBULATORY_CARE_PROVIDER_SITE_OTHER): Payer: Medicare Other | Admitting: Internal Medicine

## 2021-01-25 ENCOUNTER — Telehealth: Payer: Self-pay | Admitting: Internal Medicine

## 2021-01-25 ENCOUNTER — Encounter: Payer: Self-pay | Admitting: Internal Medicine

## 2021-01-25 DIAGNOSIS — J22 Unspecified acute lower respiratory infection: Secondary | ICD-10-CM | POA: Diagnosis not present

## 2021-01-25 DIAGNOSIS — I10 Essential (primary) hypertension: Secondary | ICD-10-CM | POA: Diagnosis not present

## 2021-01-25 DIAGNOSIS — H903 Sensorineural hearing loss, bilateral: Secondary | ICD-10-CM | POA: Diagnosis not present

## 2021-01-25 DIAGNOSIS — R7302 Impaired glucose tolerance (oral): Secondary | ICD-10-CM | POA: Diagnosis not present

## 2021-01-25 DIAGNOSIS — M8000XD Age-related osteoporosis with current pathological fracture, unspecified site, subsequent encounter for fracture with routine healing: Secondary | ICD-10-CM

## 2021-01-25 DIAGNOSIS — R413 Other amnesia: Secondary | ICD-10-CM | POA: Diagnosis not present

## 2021-01-25 DIAGNOSIS — E7800 Pure hypercholesterolemia, unspecified: Secondary | ICD-10-CM

## 2021-01-25 MED ORDER — BENZONATATE 100 MG PO CAPS
100.0000 mg | ORAL_CAPSULE | Freq: Three times a day (TID) | ORAL | 0 refills | Status: DC | PRN
Start: 2021-01-25 — End: 2021-04-20

## 2021-01-25 MED ORDER — AZITHROMYCIN 250 MG PO TABS: ORAL_TABLET | ORAL | 0 refills | Status: DC

## 2021-01-25 NOTE — Telephone Encounter (Signed)
Called and spoke with [name], his sister is going to be the one going to his moms and doing the COVID test and virtual visit. Someone will give [place] a call back to let us know about trying to get set up for today or tomorrow.

## 2021-01-25 NOTE — Patient Instructions (Signed)
Zithromax Z-PAK will be prescribed 2 tablets day 1 followed by 1 tab days 2 through 5.  Tessalon Perles 100 mg up to 3 times daily if needed for cough.  Rest and stay well-hydrated.  Call if not better in 1 week or sooner if worse.

## 2021-01-25 NOTE — Telephone Encounter (Signed)
Home COVID test negative, scheduled video visit

## 2021-01-25 NOTE — Progress Notes (Signed)
   Subjective:    Patient ID: Anna Ewing, female    DOB: 02-14-1929, 87 y.o.   MRN: [id number]  HPI 87 year old Female resides alone with son close by. Her husband passed away recently on 01-18-23.  She had a COVID-19 home test today which was negative.  Son reports his mother has had coughing postnasal drip and chest congestion worse at night.  Illness started out as laryngitis.  She has had at least one J&J vaccine in May 2021 by our records.  She has a history of hypertension and hyperlipidemia.  Has been prescribed Crestor 5 mg daily, Boniva 150 mg daily for osteoporosis, history of vitamin D deficiency and has been prescribed Drisdol 50,000 units weekly.  Takes generic Norvasc 5 mg daily for hypertension.  Patient has longstanding history of hypertension and she also has senile dementia.  Son says that patient developed respiratory symptoms just after graveside service.  The weather was cold on Wednesday.  They preferred virtual visit today rather than driving here from [place].  She is identified using 2 identifiers as Scientist, research (life sciences).L. Hartlage, a longstanding patient in this practice.  She is in her home and I am in my office.  She is agreeable to visit in this format today.  Patient complaining of cough with light yellow  sputum production.  Seems to be worse at night.  Has had no shortness of breath.  No leg swelling.  No documented fever.  Is eating.  Not heard to be coughing on this interview.  No sore throat.  No ear pain.  Last seen in person September 2021.  At that time she had glucose intolerance with hemoglobin A1c 6.2%.  Longstanding history of impaired glucose tolerance just treated with diet.  Fasting glucose at that time was 105.  TSH was normal at that time as well as CBC.  Liver functions were normal as well as BUN and creatinine.    Review of Systems see above denies vomiting diarrhea nausea or headache.  No shaking chills.  History of allergic rhinitis and used to receive  immunotherapy at [facility].  History of memory loss but currently not on medication for that.     Objective:   Physical Exam VS not taken. Her son does not have thermometer nor BP cuff available. She looks slightly pale.  Is able to speak and answer questions.  Not heard to be coughing during this brief interview.  Patient is hard of hearing.  This is not a new problem.  Does not appear to be tachypneic. They do not have pulse oximetry at home to measure her oxygen level.     Assessment & Plan:  Acute lower respiratory infection see below for disposition  Essential hypertension-treated with Norvasc  Impaired glucose tolerance-treated with diet  Situational stress with recent passing of her husband  Memory loss-longstanding  Hearing loss-longstanding  Plan: Patient will be treated with Zithromax Z-PAK 2 tablets day 1 followed by 1 tablet days 2 through 5.  Tessalon Perles 100 mg( #30) take 1 by mouth up to 3 times daily as needed for cough

## 2021-01-25 NOTE — Telephone Encounter (Signed)
This afternoon or tomorrow

## 2021-01-25 NOTE — Telephone Encounter (Signed)
Anna Ewing (Son) [phone]  [name] called to say his mom is coughing, drainage, has congestion gets a lot worse at night, it started out with laryngitis. She is Vaccinated. He is going to do Home COVID test on her. She lost her husband on 01/04/2021, he stated she been a little bit this way since then, they had a graveside service and it was cold and windy that day. He does not feel like she would come here, would like to do virtual visit if possible.

## 2021-01-30 ENCOUNTER — Other Ambulatory Visit: Payer: Self-pay | Admitting: Internal Medicine

## 2021-04-09 ENCOUNTER — Telehealth: Payer: Self-pay

## 2021-04-09 NOTE — Telephone Encounter (Signed)
Left message can see patient tomorrow at 11:45am. Lorain Childes

## 2021-04-09 NOTE — Telephone Encounter (Signed)
She needs an office visit today or tomorrow.

## 2021-04-09 NOTE — Telephone Encounter (Signed)
Patient's daughter called patient has a bedsore on her back middle back, her feet are swollen she noticed these symptoms yesterday and she would like advise on what to do.

## 2021-04-10 ENCOUNTER — Ambulatory Visit: Payer: Medicare Other | Admitting: Internal Medicine

## 2021-04-10 DIAGNOSIS — D649 Anemia, unspecified: Secondary | ICD-10-CM | POA: Diagnosis not present

## 2021-04-10 DIAGNOSIS — E785 Hyperlipidemia, unspecified: Secondary | ICD-10-CM | POA: Diagnosis not present

## 2021-04-10 DIAGNOSIS — L03312 Cellulitis of back [any part except buttock]: Secondary | ICD-10-CM | POA: Diagnosis not present

## 2021-04-10 DIAGNOSIS — R269 Unspecified abnormalities of gait and mobility: Secondary | ICD-10-CM | POA: Diagnosis not present

## 2021-04-10 DIAGNOSIS — Z79899 Other long term (current) drug therapy: Secondary | ICD-10-CM | POA: Diagnosis not present

## 2021-04-10 DIAGNOSIS — A419 Sepsis, unspecified organism: Secondary | ICD-10-CM | POA: Diagnosis not present

## 2021-04-10 DIAGNOSIS — L89109 Pressure ulcer of unspecified part of back, unspecified stage: Secondary | ICD-10-CM | POA: Diagnosis not present

## 2021-04-10 DIAGNOSIS — W19XXXA Unspecified fall, initial encounter: Secondary | ICD-10-CM | POA: Diagnosis not present

## 2021-04-10 DIAGNOSIS — I4891 Unspecified atrial fibrillation: Secondary | ICD-10-CM | POA: Diagnosis not present

## 2021-04-10 DIAGNOSIS — J90 Pleural effusion, not elsewhere classified: Secondary | ICD-10-CM | POA: Diagnosis not present

## 2021-04-10 DIAGNOSIS — R296 Repeated falls: Secondary | ICD-10-CM | POA: Diagnosis not present

## 2021-04-10 DIAGNOSIS — I517 Cardiomegaly: Secondary | ICD-10-CM | POA: Diagnosis not present

## 2021-04-10 DIAGNOSIS — M4854XA Collapsed vertebra, not elsewhere classified, thoracic region, initial encounter for fracture: Secondary | ICD-10-CM | POA: Diagnosis not present

## 2021-04-10 DIAGNOSIS — S40812A Abrasion of left upper arm, initial encounter: Secondary | ICD-10-CM | POA: Diagnosis not present

## 2021-04-10 DIAGNOSIS — I6782 Cerebral ischemia: Secondary | ICD-10-CM | POA: Diagnosis not present

## 2021-04-10 DIAGNOSIS — I10 Essential (primary) hypertension: Secondary | ICD-10-CM | POA: Diagnosis not present

## 2021-04-10 DIAGNOSIS — E871 Hypo-osmolality and hyponatremia: Secondary | ICD-10-CM | POA: Diagnosis not present

## 2021-04-10 DIAGNOSIS — R609 Edema, unspecified: Secondary | ICD-10-CM | POA: Diagnosis not present

## 2021-04-10 DIAGNOSIS — R531 Weakness: Secondary | ICD-10-CM | POA: Diagnosis not present

## 2021-04-10 DIAGNOSIS — Z2821 Immunization not carried out because of patient refusal: Secondary | ICD-10-CM | POA: Diagnosis not present

## 2021-04-10 DIAGNOSIS — Z9181 History of falling: Secondary | ICD-10-CM | POA: Diagnosis not present

## 2021-04-10 DIAGNOSIS — S2242XA Multiple fractures of ribs, left side, initial encounter for closed fracture: Secondary | ICD-10-CM | POA: Diagnosis not present

## 2021-04-10 DIAGNOSIS — R0902 Hypoxemia: Secondary | ICD-10-CM | POA: Diagnosis not present

## 2021-04-10 NOTE — Telephone Encounter (Signed)
Anna Ewing, patients son called to say he thinks he is going to take his mother to the emergency room, she is coughing up a lot of phlegm, feet are swolling, she is brushed all over and has bed sores, not cooperating with them, seems confused. I cancelled appointment for today and asked him to keeps [place] informed. Let him know that Dr Lenord Fellers was standing right here and agrees with this plan.

## 2021-04-18 ENCOUNTER — Telehealth: Payer: Self-pay

## 2021-04-18 NOTE — Telephone Encounter (Signed)
[name] said he can not bring mom on Friday she has had a rough week and she is resting a home, he said he is willing to do a video visit on Friday 04/20/2021 at 12:00pm.

## 2021-04-18 NOTE — Telephone Encounter (Signed)
OK 

## 2021-04-18 NOTE — Telephone Encounter (Signed)
Transition Care Management Follow-up Telephone Call  Date of discharge and from where: 04/17/2021 [facility]   How have you been since you were released from the hospital? Good sitting   Any questions or concerns? Yes  Items Reviewed:  Did the pt receive and understand the discharge instructions provided? Yes   Medications obtained and verified? Yes , she was put on a betablocker METOPROLOL TARTRATE 25MG  TAKE 1/2 TWICE A DAY.   Other? Yes   Any new allergies since your discharge? Yes   Dietary orders reviewed? Yes  Do you have support at home? Yes   Home Care and Equipment/Supplies: Were home health services ordered? yES If so, what is the name of the agency? Does not know Has the agency set up a time to come to the patient's home? no Were any new equipment or medical supplies ordered?  No  What is the name of the medical supply agency? No  Were you able to get the supplies/equipment? N/A Do you have any questions related to the use of the equipment or supplies? N/A  Functional Questionnaire: (I = Independent and D = Dependent) ADLs: I  Bathing/Dressing- I  Meal Prep- I  Eating- I  Maintaining continence- I  Transferring/Ambulation- I  Managing Meds- I  Follow up appointments reviewed:   PCP Hospital f/u appt confirmed? Yes  Scheduled to see virtually Dr. on 04/20/21 @ 12:00pm.  Specialist Hospital f/u appt confirmed? No  .  Are transportation arrangements needed? No   If their condition worsens, is the pt aware to call PCP or go to the Emergency Dept.? Yes  Was the patient provided with contact information for the PCP's office or ED? Yes  Was to pt encouraged to call back with questions or concerns? Yes

## 2021-04-20 ENCOUNTER — Telehealth: Payer: Self-pay | Admitting: Internal Medicine

## 2021-04-20 ENCOUNTER — Encounter: Payer: Self-pay | Admitting: Internal Medicine

## 2021-04-20 ENCOUNTER — Telehealth (INDEPENDENT_AMBULATORY_CARE_PROVIDER_SITE_OTHER): Payer: Medicare Other | Admitting: Internal Medicine

## 2021-04-20 ENCOUNTER — Inpatient Hospital Stay (HOSPITAL_COMMUNITY)
Admission: EM | Admit: 2021-04-20 | Discharge: 2021-05-01 | DRG: 291 | Disposition: A | Discharge status: Skilled Nursing Facility | Payer: Medicare Other | Attending: Family Medicine | Admitting: Family Medicine

## 2021-04-20 ENCOUNTER — Encounter (HOSPITAL_COMMUNITY): Payer: Self-pay

## 2021-04-20 ENCOUNTER — Other Ambulatory Visit: Payer: Self-pay

## 2021-04-20 ENCOUNTER — Emergency Department (HOSPITAL_COMMUNITY): Payer: Medicare Other

## 2021-04-20 VITALS — BP 130/70 | HR 80 | Temp 98.3°F | Resp 18

## 2021-04-20 DIAGNOSIS — E785 Hyperlipidemia, unspecified: Secondary | ICD-10-CM | POA: Diagnosis not present

## 2021-04-20 DIAGNOSIS — E222 Syndrome of inappropriate secretion of antidiuretic hormone: Secondary | ICD-10-CM | POA: Diagnosis present

## 2021-04-20 DIAGNOSIS — D649 Anemia, unspecified: Secondary | ICD-10-CM | POA: Diagnosis present

## 2021-04-20 DIAGNOSIS — I5033 Acute on chronic diastolic (congestive) heart failure: Secondary | ICD-10-CM | POA: Diagnosis not present

## 2021-04-20 DIAGNOSIS — F0390 Unspecified dementia without behavioral disturbance: Secondary | ICD-10-CM | POA: Diagnosis present

## 2021-04-20 DIAGNOSIS — E46 Unspecified protein-calorie malnutrition: Secondary | ICD-10-CM | POA: Diagnosis not present

## 2021-04-20 DIAGNOSIS — I272 Pulmonary hypertension, unspecified: Secondary | ICD-10-CM | POA: Diagnosis not present

## 2021-04-20 DIAGNOSIS — Z20822 Contact with and (suspected) exposure to covid-19: Secondary | ICD-10-CM | POA: Diagnosis not present

## 2021-04-20 DIAGNOSIS — I509 Heart failure, unspecified: Secondary | ICD-10-CM

## 2021-04-20 DIAGNOSIS — Z8249 Family history of ischemic heart disease and other diseases of the circulatory system: Secondary | ICD-10-CM

## 2021-04-20 DIAGNOSIS — Z87891 Personal history of nicotine dependence: Secondary | ICD-10-CM

## 2021-04-20 DIAGNOSIS — J90 Pleural effusion, not elsewhere classified: Secondary | ICD-10-CM | POA: Diagnosis not present

## 2021-04-20 DIAGNOSIS — E861 Hypovolemia: Secondary | ICD-10-CM | POA: Diagnosis present

## 2021-04-20 DIAGNOSIS — H9190 Unspecified hearing loss, unspecified ear: Secondary | ICD-10-CM | POA: Diagnosis present

## 2021-04-20 DIAGNOSIS — R1312 Dysphagia, oropharyngeal phase: Secondary | ICD-10-CM | POA: Diagnosis present

## 2021-04-20 DIAGNOSIS — I5030 Unspecified diastolic (congestive) heart failure: Secondary | ICD-10-CM | POA: Diagnosis not present

## 2021-04-20 DIAGNOSIS — W19XXXD Unspecified fall, subsequent encounter: Secondary | ICD-10-CM | POA: Diagnosis not present

## 2021-04-20 DIAGNOSIS — J690 Pneumonitis due to inhalation of food and vomit: Secondary | ICD-10-CM | POA: Diagnosis present

## 2021-04-20 DIAGNOSIS — E871 Hypo-osmolality and hyponatremia: Secondary | ICD-10-CM | POA: Diagnosis not present

## 2021-04-20 DIAGNOSIS — N179 Acute kidney failure, unspecified: Secondary | ICD-10-CM | POA: Diagnosis present

## 2021-04-20 DIAGNOSIS — Z8679 Personal history of other diseases of the circulatory system: Secondary | ICD-10-CM | POA: Diagnosis not present

## 2021-04-20 DIAGNOSIS — R600 Localized edema: Secondary | ICD-10-CM

## 2021-04-20 DIAGNOSIS — I480 Paroxysmal atrial fibrillation: Secondary | ICD-10-CM | POA: Diagnosis present

## 2021-04-20 DIAGNOSIS — R2689 Other abnormalities of gait and mobility: Secondary | ICD-10-CM | POA: Diagnosis not present

## 2021-04-20 DIAGNOSIS — I10 Essential (primary) hypertension: Secondary | ICD-10-CM | POA: Diagnosis present

## 2021-04-20 DIAGNOSIS — I130 Hypertensive heart and chronic kidney disease with heart failure and stage 1 through stage 4 chronic kidney disease, or unspecified chronic kidney disease: Secondary | ICD-10-CM | POA: Diagnosis present

## 2021-04-20 DIAGNOSIS — Z681 Body mass index (BMI) 19 or less, adult: Secondary | ICD-10-CM

## 2021-04-20 DIAGNOSIS — E876 Hypokalemia: Secondary | ICD-10-CM | POA: Diagnosis not present

## 2021-04-20 DIAGNOSIS — I110 Hypertensive heart disease with heart failure: Secondary | ICD-10-CM | POA: Diagnosis not present

## 2021-04-20 DIAGNOSIS — M6281 Muscle weakness (generalized): Secondary | ICD-10-CM | POA: Diagnosis not present

## 2021-04-20 DIAGNOSIS — J310 Chronic rhinitis: Secondary | ICD-10-CM | POA: Diagnosis not present

## 2021-04-20 DIAGNOSIS — T148XXD Other injury of unspecified body region, subsequent encounter: Secondary | ICD-10-CM | POA: Diagnosis not present

## 2021-04-20 DIAGNOSIS — S42202D Unspecified fracture of upper end of left humerus, subsequent encounter for fracture with routine healing: Secondary | ICD-10-CM | POA: Diagnosis not present

## 2021-04-20 DIAGNOSIS — M7989 Other specified soft tissue disorders: Secondary | ICD-10-CM | POA: Diagnosis not present

## 2021-04-20 DIAGNOSIS — R531 Weakness: Secondary | ICD-10-CM | POA: Diagnosis not present

## 2021-04-20 DIAGNOSIS — E440 Moderate protein-calorie malnutrition: Secondary | ICD-10-CM | POA: Diagnosis present

## 2021-04-20 DIAGNOSIS — N182 Chronic kidney disease, stage 2 (mild): Secondary | ICD-10-CM | POA: Diagnosis not present

## 2021-04-20 DIAGNOSIS — J9811 Atelectasis: Secondary | ICD-10-CM | POA: Diagnosis present

## 2021-04-20 DIAGNOSIS — R7303 Prediabetes: Secondary | ICD-10-CM | POA: Diagnosis present

## 2021-04-20 DIAGNOSIS — M810 Age-related osteoporosis without current pathological fracture: Secondary | ICD-10-CM | POA: Diagnosis not present

## 2021-04-20 DIAGNOSIS — Z743 Need for continuous supervision: Secondary | ICD-10-CM | POA: Diagnosis not present

## 2021-04-20 DIAGNOSIS — R1313 Dysphagia, pharyngeal phase: Secondary | ICD-10-CM | POA: Diagnosis not present

## 2021-04-20 DIAGNOSIS — I080 Rheumatic disorders of both mitral and aortic valves: Secondary | ICD-10-CM | POA: Diagnosis present

## 2021-04-20 DIAGNOSIS — Z79899 Other long term (current) drug therapy: Secondary | ICD-10-CM

## 2021-04-20 DIAGNOSIS — Z7401 Bed confinement status: Secondary | ICD-10-CM | POA: Diagnosis not present

## 2021-04-20 DIAGNOSIS — L8990 Pressure ulcer of unspecified site, unspecified stage: Secondary | ICD-10-CM | POA: Insufficient documentation

## 2021-04-20 DIAGNOSIS — R609 Edema, unspecified: Secondary | ICD-10-CM | POA: Diagnosis not present

## 2021-04-20 DIAGNOSIS — Z880 Allergy status to penicillin: Secondary | ICD-10-CM | POA: Diagnosis not present

## 2021-04-20 DIAGNOSIS — I499 Cardiac arrhythmia, unspecified: Secondary | ICD-10-CM | POA: Diagnosis not present

## 2021-04-20 DIAGNOSIS — D638 Anemia in other chronic diseases classified elsewhere: Secondary | ICD-10-CM | POA: Diagnosis not present

## 2021-04-20 DIAGNOSIS — R413 Other amnesia: Secondary | ICD-10-CM | POA: Diagnosis not present

## 2021-04-20 DIAGNOSIS — I2510 Atherosclerotic heart disease of native coronary artery without angina pectoris: Secondary | ICD-10-CM | POA: Diagnosis not present

## 2021-04-20 DIAGNOSIS — I5031 Acute diastolic (congestive) heart failure: Secondary | ICD-10-CM | POA: Diagnosis not present

## 2021-04-20 DIAGNOSIS — N183 Chronic kidney disease, stage 3 unspecified: Secondary | ICD-10-CM | POA: Diagnosis not present

## 2021-04-20 DIAGNOSIS — J189 Pneumonia, unspecified organism: Secondary | ICD-10-CM

## 2021-04-20 DIAGNOSIS — I248 Other forms of acute ischemic heart disease: Secondary | ICD-10-CM | POA: Diagnosis not present

## 2021-04-20 DIAGNOSIS — S72002D Fracture of unspecified part of neck of left femur, subsequent encounter for closed fracture with routine healing: Secondary | ICD-10-CM | POA: Diagnosis not present

## 2021-04-20 LAB — CBC WITH DIFFERENTIAL/PLATELET
Abs Immature Granulocytes: 0.07 10*3/uL (ref 0.00–0.07)
Basophils Absolute: 0 10*3/uL (ref 0.0–0.1)
Basophils Relative: 0 %
Eosinophils Absolute: 0.2 10*3/uL (ref 0.0–0.5)
Eosinophils Relative: 2 %
HCT: 25.6 % — ABNORMAL LOW (ref 36.0–46.0)
Hemoglobin: 8.4 g/dL — ABNORMAL LOW (ref 12.0–15.0)
Immature Granulocytes: 1 %
Lymphocytes Relative: 12 %
Lymphs Abs: 1.5 10*3/uL (ref 0.7–4.0)
MCH: 28.7 pg (ref 26.0–34.0)
MCHC: 32.8 g/dL (ref 30.0–36.0)
MCV: 87.4 fL (ref 80.0–100.0)
Monocytes Absolute: 0.9 10*3/uL (ref 0.1–1.0)
Monocytes Relative: 7 %
Neutro Abs: 9.4 10*3/uL — ABNORMAL HIGH (ref 1.7–7.7)
Neutrophils Relative %: 78 %
Platelets: 410 10*3/uL — ABNORMAL HIGH (ref 150–400)
RBC: 2.93 MIL/uL — ABNORMAL LOW (ref 3.87–5.11)
RDW: 14.3 % (ref 11.5–15.5)
WBC: 12.1 10*3/uL — ABNORMAL HIGH (ref 4.0–10.5)
nRBC: 0 % (ref 0.0–0.2)

## 2021-04-20 LAB — COMPREHENSIVE METABOLIC PANEL
ALT: 22 U/L (ref 0–44)
AST: 32 U/L (ref 15–41)
Albumin: 2.1 g/dL — ABNORMAL LOW (ref 3.5–5.0)
Alkaline Phosphatase: 51 U/L (ref 38–126)
Anion gap: 8 (ref 5–15)
BUN: 14 mg/dL (ref 8–23)
CO2: 23 mmol/L (ref 22–32)
Calcium: 8 mg/dL — ABNORMAL LOW (ref 8.9–10.3)
Chloride: 99 mmol/L (ref 98–111)
Creatinine, Ser: 0.83 mg/dL (ref 0.44–1.00)
GFR, Estimated: >60 mL/min (ref >60)
Glucose, Bld: 119 mg/dL — ABNORMAL HIGH (ref 70–99)
Potassium: 3.2 mmol/L — ABNORMAL LOW (ref 3.5–5.1)
Sodium: 130 mmol/L — ABNORMAL LOW (ref 135–145)
Total Bilirubin: 0.6 mg/dL (ref 0.3–1.2)
Total Protein: 5.9 g/dL — ABNORMAL LOW (ref 6.5–8.1)

## 2021-04-20 LAB — URINALYSIS, ROUTINE W REFLEX MICROSCOPIC
Bilirubin Urine: NEGATIVE
Glucose, UA: NEGATIVE mg/dL
Ketones, ur: NEGATIVE mg/dL
Leukocytes,Ua: NEGATIVE
Nitrite: NEGATIVE
Protein, ur: 30 mg/dL — AB
RBC / HPF: >50 RBC/hpf — ABNORMAL HIGH (ref 0–5)
Specific Gravity, Urine: 1.008 (ref 1.005–1.030)
pH: 6 (ref 5.0–8.0)

## 2021-04-20 LAB — RESP PANEL BY RT-PCR (FLU A&B, COVID) ARPGX2
Influenza A by PCR: NEGATIVE
Influenza B by PCR: NEGATIVE
SARS Coronavirus 2 by RT PCR: NEGATIVE

## 2021-04-20 LAB — CBC
HCT: 27.7 % — ABNORMAL LOW (ref 36.0–46.0)
Hemoglobin: 9.5 g/dL — ABNORMAL LOW (ref 12.0–15.0)
MCH: 29.3 pg (ref 26.0–34.0)
MCHC: 34.3 g/dL (ref 30.0–36.0)
MCV: 85.5 fL (ref 80.0–100.0)
Platelets: 394 10*3/uL (ref 150–400)
RBC: 3.24 MIL/uL — ABNORMAL LOW (ref 3.87–5.11)
RDW: 14.3 % (ref 11.5–15.5)
WBC: 12.5 10*3/uL — ABNORMAL HIGH (ref 4.0–10.5)
nRBC: 0 % (ref 0.0–0.2)

## 2021-04-20 LAB — CREATININE, SERUM
Creatinine, Ser: 0.88 mg/dL (ref 0.44–1.00)
GFR, Estimated: >60 mL/min (ref >60)

## 2021-04-20 LAB — TROPONIN I (HIGH SENSITIVITY)
Troponin I (High Sensitivity): 622 ng/L (ref <18)
Troponin I (High Sensitivity): 596 ng/L (ref <18)

## 2021-04-20 LAB — BRAIN NATRIURETIC PEPTIDE: B Natriuretic Peptide: 936.1 pg/mL — ABNORMAL HIGH (ref 0.0–100.0)

## 2021-04-20 MED ORDER — SODIUM CHLORIDE 0.9 % IV SOLN: 250.0000 mL | INTRAVENOUS | Status: DC | PRN

## 2021-04-20 MED ORDER — POTASSIUM CHLORIDE CRYS ER 20 MEQ PO TBCR
40.0000 meq | EXTENDED_RELEASE_TABLET | Freq: Every day | ORAL | Status: AC
  Administered 2021-04-20 – 2021-04-21 (×2): 40 meq via ORAL
  Filled 2021-04-20 (×2): qty 2

## 2021-04-20 MED ORDER — ENOXAPARIN SODIUM 40 MG/0.4ML IJ SOSY
40.0000 mg | PREFILLED_SYRINGE | INTRAMUSCULAR | Status: DC
  Administered 2021-04-20 – 2021-04-22 (×3): 40 mg via SUBCUTANEOUS
  Filled 2021-04-20 (×3): qty 0.4

## 2021-04-20 MED ORDER — ACETAMINOPHEN 325 MG PO TABS
650.0000 mg | ORAL_TABLET | ORAL | Status: DC | PRN
  Administered 2021-04-23 – 2021-04-25 (×2): 650 mg via ORAL
  Filled 2021-04-20 (×2): qty 2

## 2021-04-20 MED ORDER — FUROSEMIDE 10 MG/ML IJ SOLN
40.0000 mg | Freq: Two times a day (BID) | INTRAMUSCULAR | Status: DC
  Administered 2021-04-21 – 2021-04-23 (×5): 40 mg via INTRAVENOUS
  Filled 2021-04-20 (×5): qty 4

## 2021-04-20 MED ORDER — ASPIRIN 81 MG PO CHEW
324.0000 mg | CHEWABLE_TABLET | Freq: Once | ORAL | Status: AC
  Administered 2021-04-20: 324 mg via ORAL
  Filled 2021-04-20: qty 4

## 2021-04-20 MED ORDER — FUROSEMIDE 10 MG/ML IJ SOLN
20.0000 mg | Freq: Once | INTRAMUSCULAR | Status: AC
  Administered 2021-04-20: 20 mg via INTRAVENOUS
  Filled 2021-04-20: qty 2

## 2021-04-20 MED ORDER — POTASSIUM CHLORIDE 20 MEQ PO PACK
40.0000 meq | PACK | Freq: Once | ORAL | Status: DC
  Filled 2021-04-20: qty 2

## 2021-04-20 MED ORDER — SODIUM CHLORIDE 0.9% FLUSH: 3.0000 mL | INTRAVENOUS | Status: DC | PRN

## 2021-04-20 MED ORDER — SODIUM CHLORIDE 0.9% FLUSH
3.0000 mL | Freq: Two times a day (BID) | INTRAVENOUS | Status: DC
  Administered 2021-04-20 – 2021-04-28 (×17): 3 mL via INTRAVENOUS

## 2021-04-20 MED ORDER — ONDANSETRON HCL 4 MG/2ML IJ SOLN: 4.0000 mg | Freq: Four times a day (QID) | INTRAMUSCULAR | Status: DC | PRN

## 2021-04-20 NOTE — H&P (Signed)
History and Physical   Anna Ewing ZOX:[id number] DOB: 06/09/1929 DOA: 04/20/2021  Referring MD/NP/PA: Dr. Lawrence MarseillesGoldstone  PCP: Margaree MackintoshBaxley, Mary J, MD   Outpatient Specialists: None  Patient coming from: Home  Chief Complaint: Worsening leg swelling  HPI: Anna Ewing is a 87 y.o. female with medical history significant of hypertension, hyperlipidemia, chronic rhinitis hyponatremia, chronic diastolic CHF who was recently hospitalized at [facility] and discharged on May 31.  At that point she was being treated for hyponatremia CHF.  Patient was weak and skilled nursing facility was recommended but family decided to take patient home.  She went home and had worsening lower extremity edema.  Also some shortness of breath.  Was seen and evaluated by her PCP today who sent her over to the ER.  In the ER patient was noted to have 3+ pedal edema.  Also some crackles and evidence of fluid overload.  She still has some mild hyponatremia with sodium of 130.  Per patient the legs were not as swollen prior to yesterday.  Son concurred.  The swelling started mostly within the last 24 to 48 hours.  At this point patient is being admitted with acute CHF.  Patient has been taking sodium chloride tablets for hyponatremia..  ED Course: Temperature 99 blood pressure 160/68, pulse 101, respiratory of 26 and oxygen sat 87% on room air and 98% on 2 L.  Sodium is 130, potassium 3.2 chloride 99 CO2 23 glucose 119.  BUN is 14 creatinine 0.83 calcium 8.0.  White count 12.1 hemoglobin 8.4 and platelet count of 410.  BNP of 936 and troponin 622 first set second set 596.  COVID-19 screen is negative.  Chest x-ray showed interval increase in bibasilar atelectasis and mild fluid overload.  Patient being admitted with CHF.  Review of Systems: As per HPI otherwise 10 point review of systems negative.    Past Medical History:  Diagnosis Date  . Chronic rhinitis   . Hyperlipidemia   . Hypertension     Past Surgical  History:  Procedure Laterality Date  . INTRAMEDULLARY (IM) NAIL INTERTROCHANTERIC Left 03/12/2020   Procedure: INTRAMEDULLARY (IM) NAIL INTERTROCHANTRIC;  Surgeon: Roby LoftsHaddix, Kevin P, MD;  Location: [facility] OR;  Service: Orthopedics;  Laterality: Left;  Marland Kitchen. MANDIBLE SURGERY     cosmetic  . NASAL SINUS SURGERY    . ORIF HUMERUS FRACTURE Left 03/12/2020   Procedure: OPEN REDUCTION INTERNAL FIXATION (ORIF) PROXIMAL HUMERUS FRACTURE;  Surgeon: Roby LoftsHaddix, Kevin P, MD;  Location: [facility] OR;  Service: Orthopedics;  Laterality: Left;  Marland Kitchen. VESICOVAGINAL FISTULA CLOSURE W/ TAH  2005     reports that she quit smoking about 46 years ago. Her smoking use included cigarettes. She has a 5.00 pack-year smoking history. She has never used smokeless tobacco. She reports that she does not drink alcohol and does not use drugs.  Allergies  Allergen Reactions  . Amoxicillin Rash    Family History  Problem Relation Age of Onset  . Cancer Mother   . Heart disease Mother   . Heart disease Father      Prior to Admission medications   Medication Sig Start Date End Date Taking? Authorizing Provider  amLODipine (NORVASC) 5 MG tablet TAKE 1 TABLET BY MOUTH EVERY DAY Patient taking differently: Take 5 mg by mouth daily. 01/30/21  Yes Baxley, Luanna ColeMary J, MD  ergocalciferol (DRISDOL) 1.25 MG (50000 UT) capsule One po weekly for Vitamin D deficiency Patient taking differently: Take 50,000 Units by mouth once a week. 08/17/20  Yes Margaree Mackintosh, MD  ibandronate (BONIVA) 150 MG tablet Take 1 tablet (150 mg total) by mouth every 30 (thirty) days. Take in the morning with a full glass of water, on an empty stomach, and do not take anything else by mouth or lie down for the next 30 min. Patient taking differently: Take 150 mg by mouth See admin instructions. Take in the morning with a full glass of water, on an empty stomach, and do not take anything else by mouth or lie down for the next 30 min. 08/17/20  Yes Baxley, [name], MD  metoprolol  tartrate (LOPRESSOR) 25 MG tablet Take 12.5 mg by mouth 2 (two) times daily.   Yes [provider]  rosuvastatin (CRESTOR) 5 MG tablet TAKE 1 TABLET BY MOUTH EVERY DAY Patient taking differently: Take 5 mg by mouth daily. 09/21/20  Yes Baxley, [name], MD  sodium chloride 1 g tablet Take 1 g by mouth 2 (two) times daily with a meal.   Yes [provider]  Cholecalciferol (VITAMIN D) 125 MCG (5000 UT) CAPS Take 5,000 Units by mouth daily. Patient not taking: No sig reported 03/14/20   Despina Hidden, PA-C  fluticasone [facility]) 50 MCG/ACT nasal spray 1 or 2 sprays each nostril, once daily at bedtime Patient not taking: No sig reported 07/01/11   Jetty Duhamel D, MD    Physical Exam: Vitals:   04/20/21 1700 04/20/21 1730 04/20/21 1745 04/20/21 1800  BP: (!) 140/58 (!) 144/72  (!) 144/87  Pulse: 80 85 73 94  Resp: 19 (!) 23 (!) 21   Temp:      TempSrc:      SpO2: 91% 95% 93% 92%  Weight:      Height:          Constitutional: Chronically ill looking, weak, no distress Vitals:   04/20/21 1700 04/20/21 1730 04/20/21 1745 04/20/21 1800  BP: (!) 140/58 (!) 144/72  (!) 144/87  Pulse: 80 85 73 94  Resp: 19 (!) 23 (!) 21   Temp:      TempSrc:      SpO2: 91% 95% 93% 92%  Weight:      Height:       Eyes: PERRL, lids and conjunctivae normal ENMT: Mucous membranes are moist. Posterior pharynx clear of any exudate or lesions.Normal dentition.  Neck: normal, supple, no masses, no thyromegaly Respiratory: Coarse breath sounds, mild basal crackles, no wheezing, Normal respiratory effort. No accessory muscle use.  Cardiovascular: Sinus tachycardia, no murmurs / rubs / gallops. No extremity edema. 2+ pedal pulses. No carotid bruits.  Abdomen: no tenderness, no masses palpated. No hepatosplenomegaly. Bowel sounds positive.  Musculoskeletal: no clubbing / cyanosis. No joint deformity upper and lower extremities. Good ROM, no contractures. Normal muscle tone.  Skin: no rashes,  lesions, ulcers. No induration Neurologic: CN 2-12 grossly intact. Sensation intact, DTR normal. Strength 5/5 in all 4.  Psychiatric: Confused, alert and oriented x2    Labs on Admission: I have personally reviewed following labs and imaging studies  CBC: Recent Labs  Lab 04/20/21 1600  WBC 12.1*  NEUTROABS 9.4*  HGB 8.4*  HCT 25.6*  MCV 87.4  PLT 410*   Basic Metabolic Panel: Recent Labs  Lab 04/20/21 1600  NA 130*  K 3.2*  CL 99  CO2 23  GLUCOSE 119*  BUN 14  CREATININE 0.83  CALCIUM 8.0*   GFR: Estimated Creatinine Clearance: 30.7 mL/min (by C-G formula based on SCr of 0.83 mg/dL).  Liver Function Tests: Recent Labs  Lab 04/20/21 1600  AST 32  ALT 22  ALKPHOS 51  BILITOT 0.6  PROT 5.9*  ALBUMIN 2.1*   No results for input(s): LIPASE, AMYLASE in the last 168 hours. No results for input(s): AMMONIA in the last 168 hours. Coagulation Profile: No results for input(s): INR, PROTIME in the last 168 hours. Cardiac Enzymes: No results for input(s): CKTOTAL, CKMB, CKMBINDEX, TROPONINI in the last 168 hours. BNP (last 3 results) No results for input(s): PROBNP in the last 8760 hours. HbA1C: No results for input(s): HGBA1C in the last 72 hours. CBG: No results for input(s): GLUCAP in the last 168 hours. Lipid Profile: No results for input(s): CHOL, HDL, LDLCALC, TRIG, CHOLHDL, LDLDIRECT in the last 72 hours. Thyroid Function Tests: No results for input(s): TSH, T4TOTAL, FREET4, T3FREE, THYROIDAB in the last 72 hours. Anemia Panel: No results for input(s): VITAMINB12, FOLATE, FERRITIN, TIBC, IRON, RETICCTPCT in the last 72 hours. Urine analysis:    Component Value Date/Time   BILIRUBINUR NEG 05/31/2019 1131   PROTEINUR Negative 05/31/2019 1131   UROBILINOGEN 0.2 05/31/2019 1131   NITRITE NEG 05/31/2019 1131   LEUKOCYTESUR Negative 05/31/2019 1131   Sepsis Labs: @LABRCNTIP (procalcitonin:4,lacticidven:4) ) Recent Results (from the past 240 hour(s))   Resp Panel by RT-PCR (Flu A&B, Covid) Nasopharyngeal Swab     Status: None   Collection Time: 04/20/21  4:15 PM   Specimen: Nasopharyngeal Swab; Nasopharyngeal(NP) swabs in vial transport medium  Result Value Ref Range Status   SARS Coronavirus 2 by RT PCR NEGATIVE NEGATIVE Final    Comment: (NOTE) SARS-CoV-2 target nucleic acids are NOT DETECTED.  The SARS-CoV-2 RNA is generally detectable in upper respiratory specimens during the acute phase of infection. The lowest concentration of SARS-CoV-2 viral copies this assay can detect is 138 copies/mL. A negative result does not preclude SARS-Cov-2 infection and should not be used as the sole basis for treatment or other patient management decisions. A negative result may occur with  improper specimen collection/handling, submission of specimen other than nasopharyngeal swab, presence of viral mutation(s) within the areas targeted by this assay, and inadequate number of viral copies(<138 copies/mL). A negative result must be combined with clinical observations, patient history, and epidemiological information. The expected result is Negative.  Fact Sheet for Patients:  06/20/21  Fact Sheet for Healthcare Providers:  BloggerCourse.com  This test is no t yet approved or cleared by the [employer]ious[employer].[place] FDA and  has been authorized for detection and/or diagnosis of SARS-CoV-2 by FDA under an Emergency Use Authorization (EUA). This EUA will remain  in effect (meaning this test can be used) for the duration of the COVID-19 declaration under Section 564(b)(1) of the Act, 21 U.S.C.section 360bbb-3(b)(1), unless the authorization is terminated  or revoked sooner.       Influenza A by PCR NEGATIVE NEGATIVE Final   Influenza B by PCR NEGATIVE NEGATIVE Final    Comment: (NOTE) The Xpert Xpress SARS-CoV-2/FLU/RSV plus assay is intended as an aid in the diagnosis of influenza from  Nasopharyngeal swab specimens and should not be used as a sole basis for treatment. Nasal washings and aspirates are unacceptable for Xpert Xpress SARS-CoV-2/FLU/RSV testing.  Fact Sheet for Patients: [place]  Fact Sheet for Healthcare Providers: BloggerCourse.com  This test is not yet approved or cleared by the SeriousBroker.it FDA and has been authorized for detection and/or diagnosis of SARS-CoV-2 by FDA under an Emergency Use Authorization (EUA). This EUA will remain in effect (meaning this  test can be used) for the duration of the COVID-19 declaration under Section 564(b)(1) of the Act, 21 U.S.C. section 360bbb-3(b)(1), unless the authorization is terminated or revoked.  Performed at [facility], [address] [place], [place] [address]      Radiological Exams on Admission: DG Chest 2 View  Result Date: 04/20/2021 CLINICAL DATA:  Leg swelling and weakness EXAM: CHEST - 2 VIEW COMPARISON:  04/10/2021 FINDINGS: Progression of bibasilar atelectasis/infiltrate. Progression of small bilateral effusions. Mild vascular congestion without edema. Atherosclerotic aortic arch. ORIF proximal left humerus. Chronic left rib fractures. Apical scarring bilaterally. IMPRESSION: Interval increase in bibasilar atelectasis and effusions which may be due to hypoventilation or mild fluid overload. Negative for edema. Electronically Signed   By: Marlan Palau M.D.   On: 04/20/2021 15:50    EKG: Independently reviewed.  It shows sinus rhythm with borderline ST changes.  Mild ST depressions in the lateral leads.  Unknown if this is new.  But similar to one from last month  Assessment/Plan Principal Problem:   CHF (congestive heart failure), NYHA class I, acute on chronic, diastolic (HCC) Active Problems:   Hyperlipemia   Essential hypertension   Hearing loss   Hyponatremia   Anemia     #1 acute on chronic CHF: Last echocardiogram  in our system with from April 2019.  At that point EF was 60 to 70%.  Patient may still be having diastolic dysfunction CHF.  With markedly increased fluid volume we will admit the patient for aggressive diuresis.  Will likely get echocardiogram in the morning to read timing high EF.  Hold on sodium chloride salts.  Possible cardiology consultation after echocardiogram.  #2 essential hypertension: On metoprolol and amlodipine.  We will continue  #3 hyperlipidemia: Patient on Crestor.  We will continue  #4 hearing loss: Chronic.  Continue to monitor  #5 chronic hyponatremia: Again patient has improved.  On sodium chloride salt.  May need to hold it in the hospital.  #6 anemia of chronic disease: Monitor H&H   DVT prophylaxis: Lovenox Code Status: Full code Family Communication: Son over the phone Disposition Plan: Home Consults called: None Admission status: Inpatient  Severity of Illness: The appropriate patient status for this patient is INPATIENT. Inpatient status is judged to be reasonable and necessary in order to provide the required intensity of service to ensure the patient's safety. The patient's presenting symptoms, physical exam findings, and initial radiographic and laboratory data in the context of their chronic comorbidities is felt to place them at high risk for further clinical deterioration. Furthermore, it is not anticipated that the patient will be medically stable for discharge from the hospital within 2 midnights of admission. The following factors support the patient status of inpatient.   " The patient's presenting symptoms include worsening lower extremity edema. " The worrisome physical exam findings include 3+ lower extremity edema. " The initial radiographic and laboratory data are worrisome because of vascular congestion and increased troponin and BNP. " The chronic co-morbidities include hypertension.   * I certify that at the point of admission it is my  clinical judgment that the patient will require inpatient hospital care spanning beyond 2 midnights from the point of admission due to high intensity of service, high risk for further deterioration and high frequency of surveillance required.Lonia Blood MD [facility] Pager [phone]  If 7PM-7AM, please contact night-coverage [email or web address] Password [facility]  04/20/2021, 6:59 PM

## 2021-04-20 NOTE — Progress Notes (Signed)
   Subjective:    Patient ID: Anna Ewing, female    DOB: [birth year], 87 y.o.   MRN: [id number]  HPI 87 year old Female from [place] seen today via interactive audio and video telecommunications due to the coronavirus pandemic and due to the fact that it is not convenient for her to travel today.  Patient is at her home and I am at my office.  She and her son Anna Ewing are agreeable to visit in this format today.  Home health nurse, Gershon Cull, is also present on the video visit.  Patient was hospitalized May 24 at [facility] with complaint of weakness, falling and was found to have hyponatremia.  Patient is a widow and resides alone but has family close by.  Hospitalization there resulted from decreased appetite and difficulty ambulating without assistance.  She was noted to be confused.  She has had some coughing.  In the emergency department she was found to have leukocytosis 12,000, sodium 122.  Chest CT showed possible left lower lobe pneumonia versus atelectasis as well as multiple minimally displaced rib fractures.  She also had a subacute compression fracture.  She denied chest or back pain.  She was discharged back home on May 31.  Patient now has someone staying in the home with her around-the-clock according to her son, Anna Ewing.  At time of discharge her sodium was 124.  Her calcium was 7.7.  Her white count on discharge was 13,400.  Hemoglobin was 9.2 g.  Platelet count was 452,000.  I have reviewed CT angiogram done on 524 showing T12 compression fracture deformity greater than 50%.  3 mm retropulsion into the spinal canal.  This was present on chest x-ray March 12, 2020.  Minimally displaced fractures of left ribs 7, 8, 10 and 11.  Physical therapy has been ordered for the patient.  They are supposed to come out today but nurse Gershon Cull indicates she feels patient requires assistance with standing and with significant edema today up to her knees she does not feel that the patient will be able  to participate with PT and I agree with her.  I asked to get some labs today but home health nurse tells me that that will have to be ordered in the future.  Due to the circumstances, my clinical impression is that she may be in congestive heart failure based on her pitting edema of the lower extremities.  I think she should return to the hospital for evaluation.  Her son agrees to make arrangements for this today.    Review of Systems see above     Objective:   Physical Exam  Not assessed as this is a video visit      Assessment & Plan:  Concern for congestive heart failure with pitting edema up to her knees  History of rib fractures  History of compression fracture of thoracic spine  Memory loss  Hyponatremia which had not resolved at the time of discharge from [facility]  Plan: Advised patient to be taken back to the hospital for further evaluation at this point in time.  I feel that there is little that can be done as an outpatient unless we have current lab work and chest x-ray.  I am suspecting she is in congestive heart failure.  Time spent with this encounter today is 20 minutes

## 2021-04-20 NOTE — Patient Instructions (Signed)
It is my feeling patient may likely be in congestive heart failure.  She is not able to participate in PT and I am concerned she does not need to be in the home setting at this point in time and needs reevaluation urgently.  We are unable to get urgent lab work via home health today.  We are requesting that she return to the emergency department immediately for evaluation.

## 2021-04-20 NOTE — ED Notes (Signed)
MD aware of trop results.

## 2021-04-20 NOTE — ED Provider Notes (Signed)
[facility] EMERGENCY DEPARTMENT Provider Note   CSN: [id number] Arrival date & time: 04/20/21  1418     History Chief Complaint  Patient presents with  . Leg Swelling    Anna Ewing is a 87 y.o. female.  HPI 87 year old female presents with leg swelling and low oxygen. History is primarily from the son over the phone as the patient doesn't seem to know why she is in the ED. Son notes she just got out of [facility] on 5/31 after being there for hyponatremia and CHF. There they had recommend SNF but family wanted to take her home. Legs started swelling again last night. Nurse checked her O2 today and it was ~87%. Has been coughing up stuff since she was in the hospital. She was not prescribed diuretics. New meds included low dose beta blocker and salt pills.   Past Medical History:  Diagnosis Date  . Chronic rhinitis   . Hyperlipidemia   . Hypertension     Patient Active Problem List   Diagnosis Date Noted  . Anemia 04/20/2021  . CHF (congestive heart failure), NYHA class I, acute on chronic, diastolic (HCC) 04/20/2021  . Closed left hip fracture, initial encounter (HCC)   . Fall 03/19/2020  . Closed comminuted intertrochanteric fracture of left femur (HCC) 03/12/2020  . Closed fracture of left proximal humerus 03/12/2020  . Hyponatremia 08/25/2018  . Impaired glucose tolerance 02/11/2017  . Weight loss 02/11/2017  . History of migraine headaches 02/11/2017  . Hearing loss 01/30/2013  . Osteoporosis 09/22/2011  . Irritable bowel syndrome 09/22/2011  . Allergic rhinitis due to pollen 01/22/2011  . Hyperlipemia 02/06/2010  . Essential hypertension 02/06/2010  . RHINITIS, CHRONIC 02/06/2010    Past Surgical History:  Procedure Laterality Date  . INTRAMEDULLARY (IM) NAIL INTERTROCHANTERIC Left 03/12/2020   Procedure: INTRAMEDULLARY (IM) NAIL INTERTROCHANTRIC;  Surgeon: Roby Lofts, MD;  Location: [facility] OR;  Service: Orthopedics;  Laterality:  Left;  Marland Kitchen MANDIBLE SURGERY     cosmetic  . NASAL SINUS SURGERY    . ORIF HUMERUS FRACTURE Left 03/12/2020   Procedure: OPEN REDUCTION INTERNAL FIXATION (ORIF) PROXIMAL HUMERUS FRACTURE;  Surgeon: Roby Lofts, MD;  Location: [facility] OR;  Service: Orthopedics;  Laterality: Left;  Marland Kitchen VESICOVAGINAL FISTULA CLOSURE W/ TAH  2005     OB History   No obstetric history on file.     Family History  Problem Relation Age of Onset  . Cancer Mother   . Heart disease Mother   . Heart disease Father     Social History   Tobacco Use  . Smoking status: Former Smoker    Packs/day: 0.50    Years: 10.00    Pack years: 5.00    Types: Cigarettes    Quit date: 09/13/1974    Years since quitting: 46.6  . Smokeless tobacco: Never Used  Substance Use Topics  . Alcohol use: Never  . Drug use: Never    Home Medications Prior to Admission medications   Medication Sig Start Date End Date Taking? Authorizing Provider  amLODipine (NORVASC) 5 MG tablet TAKE 1 TABLET BY MOUTH EVERY DAY Patient taking differently: Take 5 mg by mouth daily. 01/30/21  Yes Baxley, [name], MD  ergocalciferol (DRISDOL) 1.25 MG (50000 UT) capsule One po weekly for Vitamin D deficiency Patient taking differently: Take 50,000 Units by mouth once a week. 08/17/20  Yes Baxley, [name], MD  ibandronate (BONIVA) 150 MG tablet Take 1 tablet (150  mg total) by mouth every 30 (thirty) days. Take in the morning with a full glass of water, on an empty stomach, and do not take anything else by mouth or lie down for the next 30 min. Patient taking differently: Take 150 mg by mouth See admin instructions. Take in the morning with a full glass of water, on an empty stomach, and do not take anything else by mouth or lie down for the next 30 min. 08/17/20  Yes Baxley, [name], MD  metoprolol tartrate (LOPRESSOR) 25 MG tablet Take 12.5 mg by mouth 2 (two) times daily.   Yes [provider]  rosuvastatin (CRESTOR) 5 MG tablet TAKE 1 TABLET BY  MOUTH EVERY DAY Patient taking differently: Take 5 mg by mouth daily. 09/21/20  Yes Baxley, [name], MD  sodium chloride 1 g tablet Take 1 g by mouth 2 (two) times daily with a meal.   Yes [provider]  Cholecalciferol (VITAMIN D) 125 MCG (5000 UT) CAPS Take 5,000 Units by mouth daily. Patient not taking: No sig reported 03/14/20   Despina Hidden, PA-C  fluticasone [facility]) 50 MCG/ACT nasal spray 1 or 2 sprays each nostril, once daily at bedtime Patient not taking: No sig reported 07/01/11   Waymon Budge, MD    Allergies    Amoxicillin  Review of Systems   Review of Systems  Constitutional: Negative for fever.  Respiratory: Positive for cough. Negative for shortness of breath.   Cardiovascular: Positive for leg swelling. Negative for chest pain.  All other systems reviewed and are negative.   Physical Exam Updated Vital Signs BP (!) 148/72 (BP Location: Right Arm)   Pulse (!) 101   Temp 99 F (37.2 C) (Oral)   Resp 20   Ht 5\' 2"  (1.575 m)   Wt 49.1 kg   SpO2 98%   BMI 19.80 kg/m   Physical Exam Vitals and nursing note reviewed.  Constitutional:      Appearance: She is well-developed.     Comments: Very hard of hearing. Denies any pain  HENT:     Head: Normocephalic and atraumatic.     Right Ear: External ear normal.     Left Ear: External ear normal.     Nose: Nose normal.  Eyes:     General:        Right eye: No discharge.        Left eye: No discharge.  Cardiovascular:     Rate and Rhythm: Normal rate and regular rhythm.     Heart sounds: Normal heart sounds.  Pulmonary:     Effort: Pulmonary effort is normal.     Breath sounds: Examination of the right-lower field reveals decreased breath sounds. Examination of the left-lower field reveals decreased breath sounds. Decreased breath sounds present.  Abdominal:     Palpations: Abdomen is soft.     Tenderness: There is no abdominal tenderness.  Skin:    General: Skin is warm and dry.   Neurological:     Mental Status: She is alert.  Psychiatric:        Mood and Affect: Mood is not anxious.     ED Results / Procedures / Treatments   Labs (all labs ordered are listed, but only abnormal results are displayed) Labs Reviewed  URINALYSIS, ROUTINE W REFLEX MICROSCOPIC - Abnormal; Notable for the following components:      Result Value   Hgb urine dipstick LARGE (*)    Protein, ur 30 (*)  RBC / HPF >50 (*)    Bacteria, UA RARE (*)    All other components within normal limits  COMPREHENSIVE METABOLIC PANEL - Abnormal; Notable for the following components:   Sodium 130 (*)    Potassium 3.2 (*)    Glucose, Bld 119 (*)    Calcium 8.0 (*)    Total Protein 5.9 (*)    Albumin 2.1 (*)    All other components within normal limits  BRAIN NATRIURETIC PEPTIDE - Abnormal; Notable for the following components:   B Natriuretic Peptide 936.1 (*)    All other components within normal limits  CBC WITH DIFFERENTIAL/PLATELET - Abnormal; Notable for the following components:   WBC 12.1 (*)    RBC 2.93 (*)    Hemoglobin 8.4 (*)    HCT 25.6 (*)    Platelets 410 (*)    Neutro Abs 9.4 (*)    All other components within normal limits  CBC - Abnormal; Notable for the following components:   WBC 12.5 (*)    RBC 3.24 (*)    Hemoglobin 9.5 (*)    HCT 27.7 (*)    All other components within normal limits  TROPONIN I (HIGH SENSITIVITY) - Abnormal; Notable for the following components:   Troponin I (High Sensitivity) 622 (*)    All other components within normal limits  TROPONIN I (HIGH SENSITIVITY) - Abnormal; Notable for the following components:   Troponin I (High Sensitivity) 596 (*)    All other components within normal limits  RESP PANEL BY RT-PCR (FLU A&B, COVID) [id number]  CREATININE, SERUM  BASIC METABOLIC PANEL    EKG EKG Interpretation  Date/Time:  Friday April 20 2021 18:00:10 EDT Ventricular Rate:  94 PR Interval:  151 QRS Duration: 88 QT Interval:  360 QTC  Calculation: 451 R Axis:   45 Text Interpretation: Sinus rhythm Borderline ST depression, lateral leads similar to earlier in the day and april 2021 Confirmed by Pricilla Loveless [id number]) on 04/20/2021 7:02:33 PM   Radiology DG Chest 2 View  Result Date: 04/20/2021 CLINICAL DATA:  Leg swelling and weakness EXAM: CHEST - 2 VIEW COMPARISON:  04/10/2021 FINDINGS: Progression of bibasilar atelectasis/infiltrate. Progression of small bilateral effusions. Mild vascular congestion without edema. Atherosclerotic aortic arch. ORIF proximal left humerus. Chronic left rib fractures. Apical scarring bilaterally. IMPRESSION: Interval increase in bibasilar atelectasis and effusions which may be due to hypoventilation or mild fluid overload. Negative for edema. Electronically Signed   By: Marlan Palau M.D.   On: 04/20/2021 15:50    Procedures Procedures   Medications Ordered in ED Medications  potassium chloride (KLOR-CON) packet 40 mEq (has no administration in time range)  sodium chloride flush (NS) 0.9 % injection 3 mL (3 mLs Intravenous Given 04/20/21 2150)  sodium chloride flush (NS) 0.9 % injection 3 mL (has no administration in time range)  0.9 %  sodium chloride infusion (has no administration in time range)  acetaminophen (TYLENOL) tablet 650 mg (has no administration in time range)  ondansetron (ZOFRAN) injection 4 mg (has no administration in time range)  enoxaparin (LOVENOX) injection 40 mg (40 mg Subcutaneous Given 04/20/21 2150)  furosemide (LASIX) injection 40 mg (has no administration in time range)  potassium chloride SA (KLOR-CON) CR tablet 40 mEq (40 mEq Oral Given 04/20/21 2149)  aspirin chewable tablet 324 mg (324 mg Oral Given 04/20/21 1758)  furosemide (LASIX) injection 20 mg (20 mg Intravenous Given 04/20/21 1809)    ED Course  I have reviewed the triage  vital signs and the nursing notes.  Pertinent labs & imaging results that were available during my care of the patient were reviewed by  me and considered in my medical decision making (see chart for details).    MDM Rules/Calculators/A&P                          Patient's work-up is most consistent with CHF.  Will give potassium and also diuresed.  She does have an impressive troponin but this is probably secondary to the CHF given that it is flat.  No obvious new ischemia comparing her ECGs.  [facility]  She will need admission as she is borderline hypoxic in the high 80s/low 90s. Final Clinical Impression(s) / ED Diagnoses Final diagnoses:  Acute congestive heart failure, unspecified heart failure type [facility](HCC)    Rx / DC Orders ED Discharge Orders    None       Pricilla LovelessGoldston, Katheryn Culliton, MD 04/20/21 2349

## 2021-04-20 NOTE — Telephone Encounter (Signed)
[name] called back to say they had followed your advice and patient is on her way to [facility] to be evaluated.

## 2021-04-20 NOTE — ED Triage Notes (Signed)
Was d/c on Tuesday for multiple complaints was sent here by PCP after a virtual visits for EMS stating increased swelling in her legs

## 2021-04-20 NOTE — ED Notes (Signed)
Note sent to attending regarding pt possibly needing swallow evalutattion

## 2021-04-20 NOTE — ED Notes (Signed)
Swallow evaluation completed by Mariane Duval  This RN

## 2021-04-21 DIAGNOSIS — L8990 Pressure ulcer of unspecified site, unspecified stage: Secondary | ICD-10-CM | POA: Insufficient documentation

## 2021-04-21 LAB — BASIC METABOLIC PANEL
Anion gap: 12 (ref 5–15)
BUN: 12 mg/dL (ref 8–23)
CO2: 21 mmol/L — ABNORMAL LOW (ref 22–32)
Calcium: 8.1 mg/dL — ABNORMAL LOW (ref 8.9–10.3)
Chloride: 99 mmol/L (ref 98–111)
Creatinine, Ser: 0.87 mg/dL (ref 0.44–1.00)
GFR, Estimated: >60 mL/min (ref >60)
Glucose, Bld: 105 mg/dL — ABNORMAL HIGH (ref 70–99)
Potassium: 4 mmol/L (ref 3.5–5.1)
Sodium: 132 mmol/L — ABNORMAL LOW (ref 135–145)

## 2021-04-21 MED ORDER — ROSUVASTATIN CALCIUM 5 MG PO TABS
5.0000 mg | ORAL_TABLET | Freq: Every day | ORAL | Status: DC
  Administered 2021-04-21 – 2021-04-28 (×8): 5 mg via ORAL
  Filled 2021-04-21 (×8): qty 1

## 2021-04-21 MED ORDER — FOOD THICKENER (SIMPLYTHICK HONEY): 5.0000 | ORAL | Status: DC | PRN

## 2021-04-21 MED ORDER — VITAMIN D (ERGOCALCIFEROL) 1.25 MG (50000 UNIT) PO CAPS
50000.0000 [IU] | ORAL_CAPSULE | ORAL | Status: DC
  Filled 2021-04-21 (×2): qty 1

## 2021-04-21 MED ORDER — METOPROLOL TARTRATE 12.5 MG HALF TABLET
12.5000 mg | ORAL_TABLET | Freq: Two times a day (BID) | ORAL | Status: DC
  Administered 2021-04-21 – 2021-05-01 (×21): 12.5 mg via ORAL
  Filled 2021-04-21 (×21): qty 1

## 2021-04-21 MED ORDER — AMLODIPINE BESYLATE 5 MG PO TABS
5.0000 mg | ORAL_TABLET | Freq: Every day | ORAL | Status: DC
  Administered 2021-04-21: 5 mg via ORAL
  Filled 2021-04-21: qty 1

## 2021-04-21 MED ORDER — SODIUM CHLORIDE 1 G PO TABS
1.0000 g | ORAL_TABLET | Freq: Two times a day (BID) | ORAL | Status: DC
  Administered 2021-04-21: 1 g via ORAL
  Filled 2021-04-21 (×2): qty 1

## 2021-04-21 NOTE — Progress Notes (Signed)
Received report from Jichelle, RN. Pt resting quietly with eyes closed in bed. Call light within reach. Will continue to monitor. 

## 2021-04-21 NOTE — Progress Notes (Signed)
NURSING PROGRESS NOTE  Anna Ewing [id number] Admission Data: 04/21/2021 12:27 AM Attending Provider: Rometta Emery, MD WGN:[id number], Anna Cole, MD Code Status: Full  Anna Ewing is a 87 y.o. female patient admitted from ED:  -No acute distress noted.  -No complaints of shortness of breath.  -No complaints of chest pain.   Cardiac Monitoring: Box # 4 in place. Cardiac monitor yields:normal sinus rhythm.  Blood pressure (!) 145/72, pulse 91, temperature 98.7 F (37.1 C), temperature source Oral, resp. rate 16, height 5\' 2"  (1.575 m), weight 49.1 kg, SpO2 98 %.   IV Fluids:  IV in place, occlusive dsg intact without redness, IV cath forearm left, condition patent and no redness none.   Allergies:  Amoxicillin  Past Medical History:   has a past medical history of Chronic rhinitis, Hyperlipidemia, and Hypertension.  Past Surgical History:   has a past surgical history that includes Vesicovaginal fistula closure w/ TAH (2005); Mandible surgery; Nasal sinus surgery; Intramedullary (im) nail intertrochanteric (Left, 03/12/2020); and ORIF humerus fracture (Left, 03/12/2020).  Social History:   reports that she quit smoking about 46 years ago. Her smoking use included cigarettes. She has a 5.00 pack-year smoking history. She has never used smokeless tobacco. She reports that she does not drink alcohol and does not use drugs.  Skin: Patient has a bruise on left hip. She has scattered bruises on bilateral upper extremities. A stage II on her mid-lower back present on admission. Foam dressing is intact. Assessed underneath the foam. She also has a reddened sacrum.    Patient/Family orientated to room. Information packet given to patient/family. Admission inpatient armband information verified with patient/family to include name and date of birth and placed on patient arm. Side rails up x 2, fall assessment and education completed with patient/family. Patient/family able to verbalize  understanding of risk associated with falls and verbalized understanding to call for assistance before getting out of bed. Call light within reach. Patient/family able to voice and demonstrate understanding of unit orientation instructions.    Will continue to evaluate and treat per MD orders.

## 2021-04-21 NOTE — Progress Notes (Addendum)
PROGRESS NOTE    EMMARY CULBREATH  WUJ:[id number] DOB: 08-16-1929 DOA: 04/20/2021 PCP: Margaree Mackintosh, MD     Brief Narrative:  Anna Ewing is a 87 y.o. female with medical history significant of hypertension, hyperlipidemia, chronic rhinitis hyponatremia, chronic diastolic CHF who was recently hospitalized at [facility] and discharged on May 31.  At that point she was being treated for hyponatremia and CHF.  Patient was weak and skilled nursing facility was recommended but family decided to take patient home.  She went home and had worsening lower extremity edema.  Also some shortness of breath.  Was seen and evaluated by her PCP today who sent her over to the ER.  In the ER patient was noted to have 3+ pedal edema.  Also some crackles and evidence of fluid overload.  She still has some mild hyponatremia with sodium of 130.  Per patient the legs were not as swollen prior to yesterday.  Son concurred.  The swelling started mostly within the last 24 to 48 hours.  She was admitted for CHF exacerbation.  New events last 24 hours / Subjective: Patient very hard of hearing, unable to really tell me why she is in the hospital.  Asking when she is going home.  Urine output 600 mL last 24 hours.  Assessment & Plan:   Principal Problem:   CHF (congestive heart failure), NYHA class I, acute on chronic, diastolic (HCC) Active Problems:   Hyperlipemia   Essential hypertension   Hearing loss   Hyponatremia   Anemia   Pressure injury of skin   Acute on chronic diastolic heart failure -BNP 936.1 -Chest x-ray showed interval increase in bibasilar atelectasis and effusion -Repeat echocardiogram pending -Continue IV Lasix -Strict I's and O's, daily weight, fluid and sodium restriction diet -Records from [place] hospitalization requested  Chronic hyponatremia -Hypervolemic -Monitor on Lasix  Hypertension -Continue metoprolol -Holding amlodipine in setting of peripheral edema    Hyperlipidemia -Continue Crestor  Demand ischemia -Troponin trend has been flat 622 > 596 -Patient denies any chest pain  Dysphagia -Continue dysphagia diet, SLP evaluated  Deconditioning -PT OT   In agreement with assessment of the pressure ulcer as below:  Pressure Injury 04/20/21 Vertebral column Mid Stage 2 -  Partial thickness loss of dermis presenting as a shallow open injury with a red, pink wound bed without slough. 0.2 diameter, no depth or drainage. White in color (Active)  04/20/21 2000  Location: Vertebral column  Location Orientation: Mid  Staging: Stage 2 -  Partial thickness loss of dermis presenting as a shallow open injury with a red, pink wound bed without slough.  Wound Description (Comments): 0.2 diameter, no depth or drainage. White in color  Present on Admission: Yes         DVT prophylaxis:  enoxaparin (LOVENOX) injection 40 mg Start: 04/20/21 2115  Code Status:     Code Status Orders  (From admission, onward)         Start     Ordered   04/20/21 2029  Full code  Continuous        04/20/21 2028        Code Status History    Date Active Date Inactive Code Status Order ID Comments User Context   03/12/2020 1415 03/16/2020 2011 Full Code [id number]  Anna General, MD ED   Advance Care Planning Activity     Family Communication: None at bedside, updated daughter over the phone  Disposition Plan:  Status is: Inpatient  Remains inpatient appropriate because:IV treatments appropriate due to intensity of illness or inability to take PO and Inpatient level of care appropriate due to severity of illness   Dispo: The patient is from: Home              Anticipated d/c is to: Home              Patient currently is not medically stable to d/c.   Difficult to place patient No      Consultants:   None   Procedures:   None   Antimicrobials:  Anti-infectives (From admission, onward)   None        Objective: Vitals:   04/21/21  0008 04/21/21 0345 04/21/21 0500 04/21/21 0847  BP: (!) 145/72 (!) 146/75  (!) 145/73  Pulse: 91 93  89  Resp: 16 17    Temp: 98.7 F (37.1 C) 98.2 F (36.8 C)  98.3 F (36.8 C)  TempSrc: Oral Oral  Oral  SpO2: 98% 97%  100%  Weight:   48.1 kg   Height:        Intake/Output Summary (Last 24 hours) at 04/21/2021 1102 Last data filed at 04/21/2021 0517 Gross per 24 hour  Intake --  Output 600 ml  Net -600 ml   Filed Weights   04/20/21 1427 04/20/21 2001 04/21/21 0500  Weight: 48.1 kg 49.1 kg 48.1 kg    Examination:  Ewing exam: Appears calm and comfortable, hard of hearing  Respiratory system: Clear to auscultation. Respiratory effort normal. No respiratory distress. No conversational dyspnea. On 2L O2 Cardiovascular system: S1 & S2 heard, RRR. +murmur, +pitting peripheral edema  Gastrointestinal system: Abdomen is nondistended, soft and nontender. Normal bowel sounds heard. Central nervous system: Alert  Extremities: Symmetric in appearance  Skin: No rashes, lesions or ulcers on exposed skin  Psychiatry: Stable   Data Reviewed: I have personally reviewed following labs and imaging studies  CBC: Recent Labs  Lab 04/20/21 1600 04/20/21 2037  WBC 12.1* 12.5*  NEUTROABS 9.4*  --   HGB 8.4* 9.5*  HCT 25.6* 27.7*  MCV 87.4 85.5  PLT 410* 394   Basic Metabolic Panel: Recent Labs  Lab 04/20/21 1600 04/20/21 2037 04/21/21 0127  NA 130*  --  132*  K 3.2*  --  4.0  CL 99  --  99  CO2 23  --  21*  GLUCOSE 119*  --  105*  BUN 14  --  12  CREATININE 0.83 0.88 0.87  CALCIUM 8.0*  --  8.1*   GFR: Estimated Creatinine Clearance: 32.6 mL/min (by C-G formula based on SCr of 0.87 mg/dL). Liver Function Tests: Recent Labs  Lab 04/20/21 1600  AST 32  ALT 22  ALKPHOS 51  BILITOT 0.6  PROT 5.9*  ALBUMIN 2.1*   No results for input(s): LIPASE, AMYLASE in the last 168 hours. No results for input(s): AMMONIA in the last 168 hours. Coagulation Profile: No results for  input(s): INR, PROTIME in the last 168 hours. Cardiac Enzymes: No results for input(s): CKTOTAL, CKMB, CKMBINDEX, TROPONINI in the last 168 hours. BNP (last 3 results) No results for input(s): PROBNP in the last 8760 hours. HbA1C: No results for input(s): HGBA1C in the last 72 hours. CBG: No results for input(s): GLUCAP in the last 168 hours. Lipid Profile: No results for input(s): CHOL, HDL, LDLCALC, TRIG, CHOLHDL, LDLDIRECT in the last 72 hours. Thyroid Function Tests: No results for input(s): TSH, T4TOTAL, FREET4, T3FREE,  THYROIDAB in the last 72 hours. Anemia Panel: No results for input(s): VITAMINB12, FOLATE, FERRITIN, TIBC, IRON, RETICCTPCT in the last 72 hours. Sepsis Labs: No results for input(s): PROCALCITON, LATICACIDVEN in the last 168 hours.  Recent Results (from the past 240 hour(s))  Resp Panel by RT-PCR (Flu A&B, Covid) Nasopharyngeal Swab     Status: None   Collection Time: 04/20/21  4:15 PM   Specimen: Nasopharyngeal Swab; Nasopharyngeal(NP) swabs in vial transport medium  Result Value Ref Range Status   SARS Coronavirus 2 by RT PCR NEGATIVE NEGATIVE Final    Comment: (NOTE) SARS-CoV-2 target nucleic acids are NOT DETECTED.  The SARS-CoV-2 RNA is generally detectable in upper respiratory specimens during the acute phase of infection. The lowest concentration of SARS-CoV-2 viral copies this assay can detect is 138 copies/mL. A negative result does not preclude SARS-Cov-2 infection and should not be used as the sole basis for treatment or other patient management decisions. A negative result may occur with  improper specimen collection/handling, submission of specimen other than nasopharyngeal swab, presence of viral mutation(s) within the areas targeted by this assay, and inadequate number of viral copies(<138 copies/mL). A negative result must be combined with clinical observations, patient history, and epidemiological information. The expected result is  Negative.  Fact Sheet for Patients:  BloggerCourse.com  Fact Sheet for Healthcare Providers:  SeriousBroker.it  This test is no t yet approved or cleared by the [place] FDA and  has been authorized for detection and/or diagnosis of SARS-CoV-2 by FDA under an Emergency Use Authorization (EUA). This EUA will remain  in effect (meaning this test can be used) for the duration of the COVID-19 declaration under Section 564(b)(1) of the Act, 21 U.S.C.section 360bbb-3(b)(1), unless the authorization is terminated  or revoked sooner.       Influenza A by PCR NEGATIVE NEGATIVE Final   Influenza B by PCR NEGATIVE NEGATIVE Final    Comment: (NOTE) The Xpert Xpress SARS-CoV-2/FLU/RSV plus assay is intended as an aid in the diagnosis of influenza from Nasopharyngeal swab specimens and should not be used as a sole basis for treatment. Nasal washings and aspirates are unacceptable for Xpert Xpress SARS-CoV-2/FLU/RSV testing.  Fact Sheet for Patients: BloggerCourse.com  Fact Sheet for Healthcare Providers: SeriousBroker.it  This test is not yet approved or cleared by the [place] FDA and has been authorized for detection and/or diagnosis of SARS-CoV-2 by FDA under an Emergency Use Authorization (EUA). This EUA will remain in effect (meaning this test can be used) for the duration of the COVID-19 declaration under Section 564(b)(1) of the Act, 21 U.S.C. section 360bbb-3(b)(1), unless the authorization is terminated or revoked.  Performed at [facility], [address] [place], [place] [address]       Radiology Studies: DG Chest 2 View  Result Date: 04/20/2021 CLINICAL DATA:  Leg swelling and weakness EXAM: CHEST - 2 VIEW COMPARISON:  04/10/2021 FINDINGS: Progression of bibasilar atelectasis/infiltrate. Progression of small bilateral effusions. Mild vascular congestion  without edema. Atherosclerotic aortic arch. ORIF proximal left humerus. Chronic left rib fractures. Apical scarring bilaterally. IMPRESSION: Interval increase in bibasilar atelectasis and effusions which may be due to hypoventilation or mild fluid overload. Negative for edema. Electronically Signed   By: Marlan Palau M.D.   On: 04/20/2021 15:50      Scheduled Meds: . amLODipine  5 mg Oral Daily  . enoxaparin (LOVENOX) injection  40 mg Subcutaneous Q24H  . furosemide  40 mg Intravenous BID  . metoprolol  tartrate  12.5 mg Oral BID  . potassium chloride  40 mEq Oral Once  . rosuvastatin  5 mg Oral Daily  . sodium chloride flush  3 mL Intravenous Q12H  . sodium chloride  1 g Oral BID WC  . [START ON 04/23/2021] Vitamin D (Ergocalciferol)  50,000 Units Oral Weekly   Continuous Infusions: . sodium chloride       LOS: 1 day      Time spent: 25 minutes   Noralee StainJennifer Shuna Tabor, DO [facility] 04/21/2021, 11:02 AM   Available via Epic secure chat 7am-7pm After these hours, please refer to coverage provider listed on [email or web address]

## 2021-04-21 NOTE — Plan of Care (Signed)
  Problem: Cardiac: Goal: Ability to achieve and maintain adequate cardiopulmonary perfusion will improve Outcome: Progressing   

## 2021-04-21 NOTE — Evaluation (Signed)
Clinical/Bedside Swallow Evaluation Patient Details  Name: Anna Ewing MRN: [id number] Date of Birth: 12/08/1928  Today's Date: 04/21/2021 Time: SLP Start Time (ACUTE ONLY): 0910 SLP Stop Time (ACUTE ONLY): 0940 SLP Time Calculation (min) (ACUTE ONLY): 30 min  Past Medical History:  Past Medical History:  Diagnosis Date  . Chronic rhinitis   . Hyperlipidemia   . Hypertension    Past Surgical History:  Past Surgical History:  Procedure Laterality Date  . INTRAMEDULLARY (IM) NAIL INTERTROCHANTERIC Left 03/12/2020   Procedure: INTRAMEDULLARY (IM) NAIL INTERTROCHANTRIC;  Surgeon: Roby Lofts, MD;  Location: [facility] OR;  Service: Orthopedics;  Laterality: Left;  Marland Kitchen MANDIBLE SURGERY     cosmetic  . NASAL SINUS SURGERY    . ORIF HUMERUS FRACTURE Left 03/12/2020   Procedure: OPEN REDUCTION INTERNAL FIXATION (ORIF) PROXIMAL HUMERUS FRACTURE;  Surgeon: Roby Lofts, MD;  Location: [facility] OR;  Service: Orthopedics;  Laterality: Left;  Marland Kitchen VESICOVAGINAL FISTULA CLOSURE W/ TAH  2005   HPI:  Anna Ewing is a 87 y.o. female with medical history significant of hypertension, hyperlipidemia, chronic rhinitis hyponatremia, chronic diastolic CHF.  Pt was admitted with acute CHF.  CXR on 04/20/21 reported "Interval increase in bibasilar atelectasis and effusions which may be due to hypoventilation or mild fluid overload."   Assessment / Plan / Recommendation Clinical Impression  Pt was seen for a bedside swallow evaluation and she presents with suspected oropharyngeal dysphagia.  Pt was encountered awake/alert and was agreeable to this evaluation.  Per chart, pt has been having coughing and throat clearing with PO intake and daughter was concerned for aspiration.  Oral mechanism examination was WNL.  Pt was seen with trials of thin liquid, nectar-thick liquid, honey-thick liquid, puree, and regular solids.  She exhibited immediate coughing and throat clearing with thin liquid, immediate throat clearing with  nectar-thick liquid, and intermittent delayed throat clearing with honey-thick liquid.  Pt exhibited prolonged mastication of regular solids, but no overt s/sx of aspiration with puree or regular solids.  Of note, pt was observed to have baseline throat clearing and in the absence of PO at the onset of this evaluation. Recommend diet change to Dysphagia 3 (soft) solids and honey-thick liquids with medications administered crushed in puree.  SLP will f/u in the next 24-48 hours to monitor diet tolerance.  Instrumental swallow evaluation may be indicated if clinical s/sx of aspiration persist.  SLP Visit Diagnosis: Dysphagia, unspecified (R13.10)    Aspiration Risk  Moderate aspiration risk    Diet Recommendation Honey-thick liquid;Dysphagia 3 (Mech soft)   Liquid Administration via: Cup;Straw Medication Administration: Crushed with puree Supervision: Patient able to self feed;Intermittent supervision to cue for compensatory strategies Compensations: Small sips/bites;Slow rate Postural Changes: Seated upright at 90 degrees    Other  Recommendations Oral Care Recommendations: Oral care BID Other Recommendations: Order thickener from pharmacy;Have oral suction available;Prohibited food (jello, ice cream, thin soups)   Follow up Recommendations Other (comment) (TBD)      Frequency and Duration min 2x/week  2 weeks       Prognosis Prognosis for Safe Diet Advancement: Good      Swallow Study   General HPI: Anna Ewing is a 87 y.o. female with medical history significant of hypertension, hyperlipidemia, chronic rhinitis hyponatremia, chronic diastolic CHF.  Pt was admitted with acute CHF.  CXR on 04/20/21 reported "Interval increase in bibasilar atelectasis and effusions which may be due to hypoventilation or mild fluid overload." Type of Study: Bedside Swallow Evaluation  Previous Swallow Assessment: none Diet Prior to this Study: Regular;Thin liquids Temperature Spikes Noted:  Yes Respiratory Status: Nasal cannula History of Recent Intubation: No Behavior/Cognition: Alert;Cooperative;Pleasant mood Oral Cavity Assessment: Within Functional Limits Oral Care Completed by SLP: No Oral Cavity - Dentition: Adequate natural dentition Vision: Functional for self-feeding Self-Feeding Abilities: Able to feed self Patient Positioning: Upright in bed Baseline Vocal Quality: Normal Volitional Cough: Strong Volitional Swallow: Able to elicit    Oral/Motor/Sensory Function Overall Oral Motor/Sensory Function: Within functional limits   Ice Chips Ice chips: Not tested   Thin Liquid Thin Liquid: Impaired Presentation: Cup;Straw Pharyngeal  Phase Impairments: Throat Clearing - Immediate;Cough - Immediate    Nectar Thick Nectar Thick Liquid: Impaired Presentation: Cup;Straw Pharyngeal Phase Impairments: Throat Clearing - Immediate   Honey Thick Honey Thick Liquid: Impaired Presentation: Cup;Straw Pharyngeal Phase Impairments: Throat Clearing - Delayed   Puree Puree: Within functional limits Presentation: Spoon   Solid     Solid: Impaired Presentation: Self Fed Oral Phase Functional Implications: Other (comment) (prolonged mastication)     Villa Herb., M.S., CCC-SLP Acute Rehabilitation Services Office: [phone]  Shanon Rosser Hiilani Jetter 04/21/2021,9:57 AM

## 2021-04-21 NOTE — Progress Notes (Signed)
Patient may need a speech evaluation. She attempts to clear her throat frequently after taking small bites and sips. Didn't give her much by mouth throughout the shift. Spoke with patient's daughter Anna Ewing to assess if this was new. She said no. That she had noticed her mother wasn't clearing her food and constantly trying to clear her throat after eating. She had concerns about her aspirating prior to her recent discharge from [facility] this past Tuesday.

## 2021-04-21 NOTE — Plan of Care (Signed)
  Problem: Cardiac: Goal: Ability to achieve and maintain adequate cardiopulmonary perfusion will improve 04/21/2021 0313 by Ernie Hew, RN Outcome: Progressing 04/21/2021 0012 by Ernie Hew, RN Outcome: Progressing

## 2021-04-21 NOTE — Progress Notes (Signed)
Contact patient's daughter  Samara Deist at  [phone] for any questions

## 2021-04-22 ENCOUNTER — Inpatient Hospital Stay (HOSPITAL_COMMUNITY): Payer: Medicare Other

## 2021-04-22 DIAGNOSIS — I5031 Acute diastolic (congestive) heart failure: Secondary | ICD-10-CM

## 2021-04-22 LAB — ECHOCARDIOGRAM COMPLETE
AR max vel: 1.11 cm2
AV Area VTI: 1.29 cm2
AV Area mean vel: 1.17 cm2
AV Mean grad: 21.7 mmHg
AV Peak grad: 38.7 mmHg
Ao pk vel: 3.11 m/s
Area-P 1/2: 2.66 cm2
Height: 62 in
MV M vel: 5.94 m/s
MV Peak grad: 141.1 mmHg
S' Lateral: 2.2 cm
Weight: 1707.24 oz

## 2021-04-22 LAB — BASIC METABOLIC PANEL
Anion gap: 10 (ref 5–15)
BUN: 12 mg/dL (ref 8–23)
CO2: 24 mmol/L (ref 22–32)
Calcium: 8 mg/dL — ABNORMAL LOW (ref 8.9–10.3)
Chloride: 92 mmol/L — ABNORMAL LOW (ref 98–111)
Creatinine, Ser: 1 mg/dL (ref 0.44–1.00)
GFR, Estimated: 54 mL/min — ABNORMAL LOW (ref >60)
Glucose, Bld: 109 mg/dL — ABNORMAL HIGH (ref 70–99)
Potassium: 3.7 mmol/L (ref 3.5–5.1)
Sodium: 126 mmol/L — ABNORMAL LOW (ref 135–145)

## 2021-04-22 LAB — CBC
HCT: 24.7 % — ABNORMAL LOW (ref 36.0–46.0)
Hemoglobin: 8.3 g/dL — ABNORMAL LOW (ref 12.0–15.0)
MCH: 28.4 pg (ref 26.0–34.0)
MCHC: 33.6 g/dL (ref 30.0–36.0)
MCV: 84.6 fL (ref 80.0–100.0)
Platelets: 399 10*3/uL (ref 150–400)
RBC: 2.92 MIL/uL — ABNORMAL LOW (ref 3.87–5.11)
RDW: 14.2 % (ref 11.5–15.5)
WBC: 12.1 10*3/uL — ABNORMAL HIGH (ref 4.0–10.5)
nRBC: 0 % (ref 0.0–0.2)

## 2021-04-22 NOTE — Progress Notes (Signed)
PROGRESS NOTE    Anna Ewing  VQX:[id number] DOB: 07/06/1929 DOA: 04/20/2021 PCP: Margaree Mackintosh, MD     Brief Narrative:  Anna Ewing is a 87 y.o. female with medical history significant of hypertension, hyperlipidemia, chronic rhinitis hyponatremia, chronic diastolic CHF who was recently hospitalized at [facility] and discharged on May 31.  At that point she was being treated for hyponatremia and CHF.  Patient was weak and skilled nursing facility was recommended but family decided to take patient home.  She went home and had worsening lower extremity edema.  Also some shortness of breath.  Was seen and evaluated by her PCP today who sent her over to the ER.  In the ER patient was noted to have 3+ pedal edema.  Also some crackles and evidence of fluid overload.  She still has some mild hyponatremia with sodium of 130.  Per patient the legs were not as swollen prior to yesterday.  Son concurred.  The swelling started mostly within the last 24 to 48 hours.  She was admitted for CHF exacerbation.  New events last 24 hours / Subjective: Patient without any new complaints today, denies any shortness of breath, she feels well overall.  She is sitting in bed, eating breakfast during my examination.  On 2 L nasal cannula O2.  Diuresed 700 mL last 24 hours.  Still awaiting records from [facility]  Assessment & Plan:   Principal Problem:   CHF (congestive heart failure), NYHA class I, acute on chronic, diastolic (HCC) Active Problems:   Hyperlipemia   Essential hypertension   Hearing loss   Hyponatremia   Anemia   Pressure injury of skin   Acute on chronic diastolic heart failure -BNP 936.1 -Chest x-ray showed interval increase in bibasilar atelectasis and effusion -Repeat echocardiogram pending -Continue IV Lasix -Strict I's and O's, daily weight, fluid and sodium restriction diet -Records from [facility] hospitalization requested again  Chronic  hyponatremia -Hypervolemic -Monitor on Lasix.  Continue sodium restriction diet  Hypertension -Continue metoprolol -Holding amlodipine in setting of peripheral edema   Hyperlipidemia -Continue Crestor  Demand ischemia -Troponin trend has been flat 622 > 596 -Patient denies any chest pain  Dysphagia -Continue dysphagia diet, SLP evaluated  Deconditioning -PT OT   In agreement with assessment of the pressure ulcer as below:  Pressure Injury 04/20/21 Vertebral column Mid Stage 2 -  Partial thickness loss of dermis presenting as a shallow open injury with a red, pink wound bed without slough. 0.2 diameter, no depth or drainage. White in color (Active)  04/20/21 2000  Location: Vertebral column  Location Orientation: Mid  Staging: Stage 2 -  Partial thickness loss of dermis presenting as a shallow open injury with a red, pink wound bed without slough.  Wound Description (Comments): 0.2 diameter, no depth or drainage. White in color  Present on Admission: Yes         DVT prophylaxis:  enoxaparin (LOVENOX) injection 40 mg Start: 04/20/21 2115  Code Status:     Code Status Orders  (From admission, onward)         Start     Ordered   04/20/21 2029  Full code  Continuous        04/20/21 2028        Code Status History    Date Active Date Inactive Code Status Order ID Comments User Context   03/12/2020 1415 03/16/2020 2011 Full Code [id number]  Emeline General, MD ED  Advance Care Planning Activity     Family Communication: None at bedside Disposition Plan:  Status is: Inpatient  Remains inpatient appropriate because:IV treatments appropriate due to intensity of illness or inability to take PO and Inpatient level of care appropriate due to severity of illness   Dispo: The patient is from: Home              Anticipated d/c is to: Home              Patient currently is not medically stable to d/c.   Difficult to place patient No      Consultants:   None    Procedures:   None   Antimicrobials:  Anti-infectives (From admission, onward)   None       Objective: Vitals:   04/22/21 0008 04/22/21 0500 04/22/21 0513 04/22/21 0803  BP: (!) 149/72  138/66 140/73  Pulse: 88  91 86  Resp: 20  18 18   Temp: 99.7 F (37.6 C)  98.7 F (37.1 C) 98.6 F (37 C)  TempSrc: Oral  Oral Oral  SpO2: 92%  92% 97%  Weight: 48.4 kg 48.4 kg    Height:        Intake/Output Summary (Last 24 hours) at 04/22/2021 1008 Last data filed at 04/22/2021 0813 Gross per 24 hour  Intake 243 ml  Output 700 ml  Net -457 ml   Filed Weights   04/21/21 0500 04/22/21 0008 04/22/21 0500  Weight: 48.1 kg 48.4 kg 48.4 kg    Examination: General exam: Appears calm and comfortable, hard of hearing Respiratory system: Diminished breath sounds, respiratory effort is normal, 2 L nasal cannula O2 Cardiovascular system: S1 & S2 heard, RRR. + Murmur.  Bilateral pitting pedal edema. Gastrointestinal system: Abdomen is nondistended, soft and nontender. Normal bowel sounds heard. Central nervous system: Alert and oriented. Non focal exam. Speech clear  Extremities: Symmetric in appearance bilaterally  Skin: No rashes, lesions or ulcers on exposed skin  Psychiatry: Stable  Data Reviewed: I have personally reviewed following labs and imaging studies  CBC: Recent Labs  Lab 04/20/21 1600 04/20/21 2037 04/22/21 0406  WBC 12.1* 12.5* 12.1*  NEUTROABS 9.4*  --   --   HGB 8.4* 9.5* 8.3*  HCT 25.6* 27.7* 24.7*  MCV 87.4 85.5 84.6  PLT 410* 394 399   Basic Metabolic Panel: Recent Labs  Lab 04/20/21 1600 04/20/21 2037 04/21/21 0127 04/22/21 0406  NA 130*  --  132* 126*  K 3.2*  --  4.0 3.7  CL 99  --  99 92*  CO2 23  --  21* 24  GLUCOSE 119*  --  105* 109*  BUN 14  --  12 12  CREATININE 0.83 0.88 0.87 1.00  CALCIUM 8.0*  --  8.1* 8.0*   GFR: Estimated Creatinine Clearance: 28.6 mL/min (by C-G formula based on SCr of 1 mg/dL). Liver Function Tests: Recent  Labs  Lab 04/20/21 1600  AST 32  ALT 22  ALKPHOS 51  BILITOT 0.6  PROT 5.9*  ALBUMIN 2.1*   No results for input(s): LIPASE, AMYLASE in the last 168 hours. No results for input(s): AMMONIA in the last 168 hours. Coagulation Profile: No results for input(s): INR, PROTIME in the last 168 hours. Cardiac Enzymes: No results for input(s): CKTOTAL, CKMB, CKMBINDEX, TROPONINI in the last 168 hours. BNP (last 3 results) No results for input(s): PROBNP in the last 8760 hours. HbA1C: No results for input(s): HGBA1C in the last 72 hours.  CBG: No results for input(s): GLUCAP in the last 168 hours. Lipid Profile: No results for input(s): CHOL, HDL, LDLCALC, TRIG, CHOLHDL, LDLDIRECT in the last 72 hours. Thyroid Function Tests: No results for input(s): TSH, T4TOTAL, FREET4, T3FREE, THYROIDAB in the last 72 hours. Anemia Panel: No results for input(s): VITAMINB12, FOLATE, FERRITIN, TIBC, IRON, RETICCTPCT in the last 72 hours. Sepsis Labs: No results for input(s): PROCALCITON, LATICACIDVEN in the last 168 hours.  Recent Results (from the past 240 hour(s))  Resp Panel by RT-PCR (Flu A&B, Covid) Nasopharyngeal Swab     Status: None   Collection Time: 04/20/21  4:15 PM   Specimen: Nasopharyngeal Swab; Nasopharyngeal(NP) swabs in vial transport medium  Result Value Ref Range Status   SARS Coronavirus 2 by RT PCR NEGATIVE NEGATIVE Final    Comment: (NOTE) SARS-CoV-2 target nucleic acids are NOT DETECTED.  The SARS-CoV-2 RNA is generally detectable in upper respiratory specimens during the acute phase of infection. The lowest concentration of SARS-CoV-2 viral copies this assay can detect is 138 copies/mL. A negative result does not preclude SARS-Cov-2 infection and should not be used as the sole basis for treatment or other patient management decisions. A negative result may occur with  improper specimen collection/handling, submission of specimen other than nasopharyngeal swab, presence of  viral mutation(s) within the areas targeted by this assay, and inadequate number of viral copies(<138 copies/mL). A negative result must be combined with clinical observations, patient history, and epidemiological information. The expected result is Negative.  Fact Sheet for Patients:  BloggerCourse.com  Fact Sheet for Healthcare Providers:  SeriousBroker.it  This test is no t yet approved or cleared by the [place] FDA and  has been authorized for detection and/or diagnosis of SARS-CoV-2 by FDA under an Emergency Use Authorization (EUA). This EUA will remain  in effect (meaning this test can be used) for the duration of the COVID-19 declaration under Section 564(b)(1) of the Act, 21 U.S.C.section 360bbb-3(b)(1), unless the authorization is terminated  or revoked sooner.       Influenza A by PCR NEGATIVE NEGATIVE Final   Influenza B by PCR NEGATIVE NEGATIVE Final    Comment: (NOTE) The Xpert Xpress SARS-CoV-2/FLU/RSV plus assay is intended as an aid in the diagnosis of influenza from Nasopharyngeal swab specimens and should not be used as a sole basis for treatment. Nasal washings and aspirates are unacceptable for Xpert Xpress SARS-CoV-2/FLU/RSV testing.  Fact Sheet for Patients: BloggerCourse.com  Fact Sheet for Healthcare Providers: SeriousBroker.it  This test is not yet approved or cleared by the [place] FDA and has been authorized for detection and/or diagnosis of SARS-CoV-2 by FDA under an Emergency Use Authorization (EUA). This EUA will remain in effect (meaning this test can be used) for the duration of the COVID-19 declaration under Section 564(b)(1) of the Act, 21 U.S.C. section 360bbb-3(b)(1), unless the authorization is terminated or revoked.  Performed at [facility], [address] [place], [place] [address]       Radiology Studies: DG  Chest 2 View  Result Date: 04/20/2021 CLINICAL DATA:  Leg swelling and weakness EXAM: CHEST - 2 VIEW COMPARISON:  04/10/2021 FINDINGS: Progression of bibasilar atelectasis/infiltrate. Progression of small bilateral effusions. Mild vascular congestion without edema. Atherosclerotic aortic arch. ORIF proximal left humerus. Chronic left rib fractures. Apical scarring bilaterally. IMPRESSION: Interval increase in bibasilar atelectasis and effusions which may be due to hypoventilation or mild fluid overload. Negative for edema. Electronically Signed   By: Marlan Palau M.D.  On: 04/20/2021 15:50      Scheduled Meds: . enoxaparin (LOVENOX) injection  40 mg Subcutaneous Q24H  . furosemide  40 mg Intravenous BID  . metoprolol tartrate  12.5 mg Oral BID  . rosuvastatin  5 mg Oral Daily  . sodium chloride flush  3 mL Intravenous Q12H  . [START ON 04/23/2021] Vitamin D (Ergocalciferol)  50,000 Units Oral Weekly   Continuous Infusions: . sodium chloride       LOS: 2 days      Time spent: 25 minutes   Noralee StainJennifer Shalon Salado, DO [facility] 04/22/2021, 10:08 AM   Available via Epic secure chat 7am-7pm After these hours, please refer to coverage provider listed on [email or web address]

## 2021-04-22 NOTE — Progress Notes (Signed)
*  PRELIMINARY RESULTS* Echocardiogram 2D Echocardiogram has been performed.  Stacey Drain 04/22/2021, 2:17 PM

## 2021-04-22 NOTE — Progress Notes (Addendum)
Initial Nutrition Assessment  DOCUMENTATION CODES:   Not applicable  INTERVENTION:    Magic cup TID with meals, each supplement provides 290 kcal and 9 grams of protein  MVI with minerals daily  NUTRITION DIAGNOSIS:   Increased nutrient needs related to wound healing as evidenced by estimated needs.  GOAL:   Patient will meet greater than or equal to 90% of their needs  MONITOR:   PO intake,Supplement acceptance,Skin  REASON FOR ASSESSMENT:   Malnutrition Screening Tool    ASSESSMENT:   87 yo female admitted with CHF exacerbation. PMH includes HTN, HLD, CHF.   Labs reviewed. Na 126 Medications reviewed and include Lasix, ergocalciferol.   S/P bedside swallow evaluation with SLP 6/4. Plans for MBS hopefully later today.   Unable to speak with patient over the phone today. No answer when number called.  Patient is at increased nutrition risk, given dysphagia and reported poor intake on MST. Patient would benefit from a nutrition supplement to maximize oral intake.  Weight history reviewed. No significant weight changes noted recently. Current volume overload with edema could be masking weight loss.  NUTRITION - FOCUSED PHYSICAL EXAM:  unable to complete  Diet Order:   Diet Order            DIET DYS 3 Room service appropriate? Yes; Fluid consistency: Honey Thick; Fluid restriction: 2000 mL Fluid  Diet effective now                 EDUCATION NEEDS:   Not appropriate for education at this time  Skin:  Skin Assessment: Skin Integrity Issues: Skin Integrity Issues:: Stage II Stage II: vertebral column  Last BM:  6/3  Height:   Ht Readings from Last 1 Encounters:  04/20/21 5\' 2"  (1.575 m)    Weight:   Wt Readings from Last 1 Encounters:  04/22/21 48.4 kg    Ideal Body Weight:  50 kg  BMI:  Body mass index is 19.52 kg/m.  Estimated Nutritional Needs:   Kcal:  1300-1500  Protein:  65-75 gm  Fluid:  1.3-1.5 L    06/22/21, RD, LDN,  CNSC Please refer to Amion for contact information.

## 2021-04-22 NOTE — Progress Notes (Signed)
Modified Barium Swallow Progress Note  Patient Details  Name: Anna Ewing MRN: [id number] Date of Birth: [birth year]  Today's Date: 04/22/2021  Modified Barium Swallow completed.  Full report located under Chart Review in the Imaging Section.  Brief recommendations include the following:  Clinical Impression  Pt has a moderate pharyngoesophageal dysphagia with limited entrance into the UES. Despite other adequate components of her pharyngeal swallow, she does not achieve full epiglottic inversion as her epiglottis seems to be impeded as it hits her posterior pharyngeal swallow. She has a tight UES with prominent/fullness to CP (see image below) that significantly reduces UES in terms of opening and duration for opening. Residue remains in the pharynx, increasing in volume as consistencies become thicker. Throat clearing throughout the study was either associated with pharyngeal residue, which pt also tried to spontaneously clear with repeated swallows, or with trace penetration that occurs mostly during the swallow. Throat clearing is largely protective of the airway, and a cue to clear her throat harder does eject any penetrates that she doesn't clear on her own. However, pt did begin to aspirate thin liquids once her pharynx was more filled with residue from purees. Although not cleared, this was at least sensed with an attempt at coughing. Recommend Dys 1 diet and nectar thick liquids for now with use of intermittent throat clearing. MD may wish to further assess anatomy around the level of the UES given impact on swallowing function.       Swallow Evaluation Recommendations       SLP Diet Recommendations: Dysphagia 1 (Puree) solids;Nectar thick liquid   Liquid Administration via: Cup;Straw   Medication Administration: Crushed with puree   Supervision: Patient able to self feed;Full supervision/cueing for compensatory strategies   Compensations: Slow rate;Small sips/bites;Clear throat  after each swallow   Postural Changes: Seated upright at 90 degrees;Remain semi-upright after after feeds/meals (Comment)   Oral Care Recommendations: Oral care BID   Other Recommendations: Order thickener from pharmacy;Prohibited food (jello, ice cream, thin soups);Remove water pitcher    Mahala Menghini., M.A. CCC-SLP Acute Rehabilitation Services Pager [phone] Office [phone]  04/22/2021,3:42 PM

## 2021-04-22 NOTE — Evaluation (Signed)
Physical Therapy Evaluation Patient Details Name: Anna Ewing MRN: [id number] DOB: August 18, 1929 Today's Date: 04/22/2021   History of Present Illness  Pt is a 87 y/o female referred to the ER by her PCP for lower extremity edema and SOB. Pt admitted on 04/20/2021 with acute CHF. PMH significant of hypertension, hyperlipidemia, chronic rhinitis hyponatremia, chronic diastolic CHF who was recently hospitalized at [facility] for hyponatremia CHF and discharged on May 31. Chest x-ray showed interval increase in bibasilar atelectasis and mild fluid overload.  Clinical Impression  Pt demonstrates difficulty recalling home living arrangements as well as the date and her present location. Pt asks for her bedroom slippers approximately 20 times during session, forgetting that they are not in her hospital room. Pt demonstrates ability to perform bed mobility without requiring physical assistance. Pt tolerates transfers, requiring some physical assistance and multiple cues for hand placement. Pt tolerates ambulation of short distances with RW, without the need for physical assistance, limited by fatigue. Pt demonstrates deficits in strength, endurance, power, activity tolerance, cognition, and safety awareness and will benefit from acute PT to improve safety and functional mobility. SPT recommends SNF placement to due to safety concerns and to address physical assistance requirements for all OOB mobility.    Follow Up Recommendations SNF;Supervision for mobility/OOB    Equipment Recommendations  Rolling walker with 5" wheels;3in1 (PT)    Recommendations for Other Services       Precautions / Restrictions Precautions Precautions: Fall Precaution Comments: Pt incontinent bowel and bladder Restrictions Weight Bearing Restrictions: No      Mobility  Bed Mobility Overal bed mobility: Needs Assistance Bed Mobility: Supine to Sit     Supine to sit: Min guard     General bed mobility comments:  min G for assist    Transfers Overall transfer level: Needs assistance Equipment used: Rolling walker (2 wheeled) Transfers: Sit to/from Stand (3x) Sit to Stand: Min assist         General transfer comment: min A to power to stand. multiple cues for handplacement  Ambulation/Gait Ambulation/Gait assistance: Min guard Gait Distance (Feet): 4 Feet Assistive device: Rolling walker (2 wheeled) Gait Pattern/deviations: Step-through pattern;Decreased stride length;Trunk flexed Gait velocity: decreased Gait velocity interpretation: <1.31 ft/sec, indicative of household ambulator General Gait Details: Pt requires cues for proximity to walker.  Stairs            Wheelchair Mobility    Modified Rankin (Stroke Patients Only)       Balance Overall balance assessment: Needs assistance Sitting-balance support: Feet supported Sitting balance-Leahy Scale: Fair     Standing balance support: Bilateral upper extremity supported;During functional activity Standing balance-Leahy Scale: Poor Standing balance comment: Pt reliant on UE support to maintain standing balance.                             Pertinent Vitals/Pain Pain Assessment: No/denies pain    Home Living Family/patient expects to be discharged to:: Private residence Living Arrangements: Alone Available Help at Discharge: Family;Available PRN/intermittently (daughter and son) Type of Home: House Home Access: Level entry     Home Layout: Two level Home Equipment: Cane - single point Additional Comments: Pt has difficulty recalling home living history. When asked about home layout, pt reports she is insure if it is one level or two. Pt also reports she thinks hoem access is level entry.    Prior Function Level of Independence: Independent with assistive device(s)  Comments: Pt reports she is able to perform ADLs independently and uses a cane for mobility. Unsure if this is the case.      Hand Dominance        Extremity/Trunk Assessment   Upper Extremity Assessment Upper Extremity Assessment: Generalized weakness    Lower Extremity Assessment Lower Extremity Assessment: Generalized weakness    Cervical / Trunk Assessment Cervical / Trunk Assessment: Kyphotic  Communication   Communication: HOH  Cognition Arousal/Alertness: Awake/alert Behavior During Therapy: WFL for tasks assessed/performed Overall Cognitive Status: No family/caregiver present to determine baseline cognitive functioning                                 General Comments: Pt unable to accurately state the day/month/year. Pt also unable to say where she is at the time of evaluation, when given a list of choices pt, reports she is at [employer]. Pt has difficulty recalling home living arrangements.      General Comments General comments (skin integrity, edema, etc.): Pt on 2 L [place] on arrival, satting at 98%. Pt weaned to RA and satting between 95-97% during session. Pt left on RA, nursing notified.    Exercises General Exercises - Lower Extremity Ankle Circles/Pumps: 10 reps;Both;Left;Right;Seated Long Arc Quad: 10 reps;Right;Left;Both;Seated   Assessment/Plan    PT Assessment Patient needs continued PT services  PT Problem List Decreased strength;Decreased activity tolerance;Decreased balance;Decreased mobility;Decreased cognition;Decreased knowledge of use of DME;Decreased safety awareness;Decreased knowledge of precautions       PT Treatment Interventions DME instruction;Gait training;Functional mobility training;Therapeutic activities;Therapeutic exercise;Balance training;Patient/family education    PT Goals (Current goals can be found in the Care Plan section)  Acute Rehab PT Goals Patient Stated Goal: Have her bedroom slippers PT Goal Formulation: With patient Time For Goal Achievement: 05/06/21 Potential to Achieve Goals: Good    Frequency Min 2X/week    Barriers to discharge        Co-evaluation               AM-PAC PT "6 Clicks" Mobility  Outcome Measure Help needed turning from your back to your side while in a flat bed without using bedrails?: A Little Help needed moving from lying on your back to sitting on the side of a flat bed without using bedrails?: A Little Help needed moving to and from a bed to a chair (including a wheelchair)?: A Little Help needed standing up from a chair using your arms (e.g., wheelchair or bedside chair)?: A Little Help needed to walk in hospital room?: A Little Help needed climbing 3-5 steps with a railing? : A Lot 6 Click Score: 17    End of Session Equipment Utilized During Treatment: Gait belt Activity Tolerance: Patient limited by fatigue Patient left: in chair;with call bell/phone within reach;with chair alarm set Nurse Communication: Mobility status PT Visit Diagnosis: Other abnormalities of gait and mobility (R26.89);Muscle weakness (generalized) (M62.81);Difficulty in walking, not elsewhere classified (R26.2)    Time: 4709-6283 PT Time Calculation (min) (ACUTE ONLY): 52 min   Charges:   PT Evaluation $PT Eval Low Complexity: 1 Low PT Treatments $Therapeutic Activity: 23-37 mins        Acute Rehab  Pager: [phone]   Waldemar Dickens, SPT  04/22/2021, 1:56 PM

## 2021-04-22 NOTE — Progress Notes (Signed)
  Speech Language Pathology Treatment: Dysphagia  Patient Details Name: Anna Ewing MRN: [id number] DOB: [birth year] Today's Date: 04/22/2021 Time: 3545-6256 SLP Time Calculation (min) (ACUTE ONLY): 20 min  Assessment / Plan / Recommendation Clinical Impression  Pt has persistent wet vocal quality, throat clearing, and expectoration of secretions at baseline that could be concerning for swallowing difficulties perhaps with reduced airway protection. During intake of breakfast meal this persists, with questionable increase in frequency of throat clearing while consuming honey thick liquids but this is difficult to discern given baseline presentation. She did orally expectorate a small amount of solid foods, but this was before she tried to swallow them (question if she got a bite that was particularly hard?). Recommend continuing current diet but proceeding with MBS. Will try to complete today based upon radiology's availability.    HPI HPI: Anna Ewing is a 87 y.o. female with medical history significant of hypertension, hyperlipidemia, chronic rhinitis hyponatremia, chronic diastolic CHF.  Pt was admitted with acute CHF.  CXR on 04/20/21 reported "Interval increase in bibasilar atelectasis and effusions which may be due to hypoventilation or mild fluid overload."      SLP Plan  MBS       Recommendations  Diet recommendations: Dysphagia 3 (mechanical soft);Honey-thick liquid Liquids provided via: Straw;Cup Medication Administration: Crushed with puree Supervision: Patient able to self feed;Full supervision/cueing for compensatory strategies Compensations: Small sips/bites;Slow rate Postural Changes and/or Swallow Maneuvers: Seated upright 90 degrees;Upright 30-60 min after meal                Oral Care Recommendations: Oral care BID Follow up Recommendations: Other (comment) SLP Visit Diagnosis: Dysphagia, unspecified (R13.10) Plan: MBS       GO                Mahala Menghini.,  M.A. CCC-SLP Acute Rehabilitation Services Pager [phone] Office [phone]  04/22/2021, 8:35 AM

## 2021-04-22 NOTE — Progress Notes (Signed)
OT Cancellation Note  Patient Details Name: SHERRYLL SKOCZYLAS MRN: [id number] DOB: 15-Aug-1929   Cancelled Treatment:    Reason Eval/Treat Not Completed: Other (comment) (Pt having ECHO completed and will go for MBS soon after. OT to continue to follow for OT eval.)  Flora Lipps, OTR/L Acute Rehabilitation Services Pager: [phone] Office: [phone]  Ansen Sayegh C 04/22/2021, 1:47 PM

## 2021-04-23 DIAGNOSIS — I5033 Acute on chronic diastolic (congestive) heart failure: Principal | ICD-10-CM

## 2021-04-23 LAB — BASIC METABOLIC PANEL
Anion gap: 12 (ref 5–15)
BUN: 20 mg/dL (ref 8–23)
CO2: 25 mmol/L (ref 22–32)
Calcium: 8.2 mg/dL — ABNORMAL LOW (ref 8.9–10.3)
Chloride: 90 mmol/L — ABNORMAL LOW (ref 98–111)
Creatinine, Ser: 1.3 mg/dL — ABNORMAL HIGH (ref 0.44–1.00)
GFR, Estimated: 39 mL/min — ABNORMAL LOW (ref >60)
Glucose, Bld: 130 mg/dL — ABNORMAL HIGH (ref 70–99)
Potassium: 3.2 mmol/L — ABNORMAL LOW (ref 3.5–5.1)
Sodium: 127 mmol/L — ABNORMAL LOW (ref 135–145)

## 2021-04-23 LAB — CBC
HCT: 25.8 % — ABNORMAL LOW (ref 36.0–46.0)
Hemoglobin: 8.6 g/dL — ABNORMAL LOW (ref 12.0–15.0)
MCH: 28.7 pg (ref 26.0–34.0)
MCHC: 33.3 g/dL (ref 30.0–36.0)
MCV: 86 fL (ref 80.0–100.0)
Platelets: 480 10*3/uL — ABNORMAL HIGH (ref 150–400)
RBC: 3 MIL/uL — ABNORMAL LOW (ref 3.87–5.11)
RDW: 14.3 % (ref 11.5–15.5)
WBC: 11.1 10*3/uL — ABNORMAL HIGH (ref 4.0–10.5)
nRBC: 0 % (ref 0.0–0.2)

## 2021-04-23 MED ORDER — ENOXAPARIN SODIUM 30 MG/0.3ML IJ SOSY
30.0000 mg | PREFILLED_SYRINGE | INTRAMUSCULAR | Status: DC
  Administered 2021-04-23 – 2021-04-30 (×8): 30 mg via SUBCUTANEOUS
  Filled 2021-04-23 (×8): qty 0.3

## 2021-04-23 MED ORDER — NEPRO/CARBSTEADY PO LIQD
237.0000 mL | Freq: Two times a day (BID) | ORAL | Status: DC
  Administered 2021-04-24 – 2021-05-01 (×12): 237 mL via ORAL

## 2021-04-23 MED ORDER — ADULT MULTIVITAMIN W/MINERALS CH
1.0000 | ORAL_TABLET | Freq: Every day | ORAL | Status: DC
  Administered 2021-04-23 – 2021-05-01 (×9): 1 via ORAL
  Filled 2021-04-23 (×9): qty 1

## 2021-04-23 MED ORDER — POTASSIUM CHLORIDE CRYS ER 20 MEQ PO TBCR
40.0000 meq | EXTENDED_RELEASE_TABLET | Freq: Once | ORAL | Status: AC
  Administered 2021-04-23: 40 meq via ORAL
  Filled 2021-04-23: qty 2

## 2021-04-23 MED ORDER — POTASSIUM CHLORIDE 20 MEQ PO PACK
20.0000 meq | PACK | Freq: Two times a day (BID) | ORAL | Status: DC
  Administered 2021-04-23 – 2021-04-24 (×2): 20 meq via ORAL
  Filled 2021-04-23 (×3): qty 1

## 2021-04-23 NOTE — NC FL2 (Signed)
[place] MEDICAID FL2 LEVEL OF CARE SCREENING TOOL     IDENTIFICATION  Patient Name: Anna Ewing Birthdate: 20-Jan-1929 Sex: female Admission Date (Current Location): 04/20/2021  [facility] and [place] Number:  Producer, television/film/video and Address:  [facility] [place] [facility], [address], [place], [place] [address]      Provider Number: [id number]  Attending Physician Name and Address:  Noralee Stain, DO  Relative Name and Phone Number:  Donya Hitch, [phone]    Current Level of Care: Hospital Recommended Level of Care: Skilled Nursing Facility Prior Approval Number:    Date Approved/Denied:   PASRR Number: [id number]  Discharge Plan: SNF    Current Diagnoses: Patient Active Problem List   Diagnosis Date Noted  . Pressure injury of skin 04/21/2021  . Anemia 04/20/2021  . CHF (congestive heart failure), NYHA class I, acute on chronic, diastolic (HCC) 04/20/2021  . Closed left hip fracture, initial encounter (HCC)   . Fall 03/19/2020  . Closed comminuted intertrochanteric fracture of left femur (HCC) 03/12/2020  . Closed fracture of left proximal humerus 03/12/2020  . Hyponatremia 08/25/2018  . Impaired glucose tolerance 02/11/2017  . Weight loss 02/11/2017  . History of migraine headaches 02/11/2017  . Hearing loss 01/30/2013  . Osteoporosis 09/22/2011  . Irritable bowel syndrome 09/22/2011  . Allergic rhinitis due to pollen 01/22/2011  . Hyperlipemia 02/06/2010  . Essential hypertension 02/06/2010  . RHINITIS, CHRONIC 02/06/2010    Orientation RESPIRATION BLADDER Height & Weight     Self,Time  Normal Incontinent,External catheter Weight: 103 lb 1.6 oz (46.8 kg) (scale C) Height:  5\' 2"  (157.5 cm)  BEHAVIORAL SYMPTOMS/MOOD NEUROLOGICAL BOWEL NUTRITION STATUS      Incontinent Diet (See DC summary)  AMBULATORY STATUS COMMUNICATION OF NEEDS Skin   Extensive Assist Verbally PU Stage and Appropriate Care (PI on vertabal column w/ foam dressing Stage 2  and Ecchymosis on hip.)                       Personal Care Assistance Level of Assistance  Bathing,Feeding,Dressing Bathing Assistance: Maximum assistance Feeding assistance: Limited assistance Dressing Assistance: Maximum assistance     Functional Limitations Info  Sight,Hearing,Speech Sight Info: Adequate Hearing Info: Impaired Speech Info: Adequate    SPECIAL CARE FACTORS FREQUENCY  PT (By licensed PT),OT (By licensed OT),Speech therapy     PT Frequency: 5x week OT Frequency: 5x week     Speech Therapy Frequency: 3x week      Contractures Contractures Info: Not present    Additional Factors Info  Code Status,Allergies Code Status Info: Full Allergies Info: Chocolate, Amoxicillin           Current Medications (04/23/2021):  This is the current hospital active medication list Current Facility-Administered Medications  Medication Dose Route Frequency Provider Last Rate Last Admin  . 0.9 %  sodium chloride infusion  250 mL Intravenous PRN 06/23/2021, MD      . acetaminophen (TYLENOL) tablet 650 mg  650 mg Oral Q4H PRN Rometta Emery L, MD      . enoxaparin (LOVENOX) injection 30 mg  30 mg Subcutaneous Q24H Earlie Lou, [facility]      . [START ON 04/24/2021] feeding supplement (NEPRO CARB STEADY) liquid 237 mL  237 mL Oral BID BM 06/24/2021, DO      . food thickener (SIMPLYTHICK (HONEY/LEVEL 3/MODERATELY THICK)) 5 packet  5 packet Oral PRN Noralee Stain, DO      .  metoprolol tartrate (LOPRESSOR) tablet 12.5 mg  12.5 mg Oral BID Earlie Lou L, MD   12.5 mg at 04/23/21 1015  . multivitamin with minerals tablet 1 tablet  1 tablet Oral Daily Noralee Stain, DO   1 tablet at 04/23/21 1655  . ondansetron (ZOFRAN) injection 4 mg  4 mg Intravenous Q6H PRN Earlie Lou L, MD      . potassium chloride (KLOR-CON) packet 20 mEq  20 mEq Oral BID Nahser, Deloris Ping, MD      . rosuvastatin (CRESTOR) tablet 5 mg  5 mg Oral Daily Rometta Emery, MD   5 mg at  04/23/21 1015  . sodium chloride flush (NS) 0.9 % injection 3 mL  3 mL Intravenous Q12H Earlie Lou L, MD   3 mL at 04/23/21 1015  . sodium chloride flush (NS) 0.9 % injection 3 mL  3 mL Intravenous PRN Rometta Emery, MD      . Vitamin D (Ergocalciferol) (DRISDOL) capsule 50,000 Units  50,000 Units Oral Weekly Rometta Emery, MD         Discharge Medications: Please see discharge summary for a list of discharge medications.  Relevant Imaging Results:  Relevant Lab Results:   Additional Information SS# [id number]  Carley Hammed, [place]

## 2021-04-23 NOTE — Consult Note (Signed)
Cardiology Consultation:   Patient ID: Anna Ewing MRN: [id number]; DOB: [birth year]  Admit date: 04/20/2021 Date of Consult: 04/23/2021  PCP:  Margaree Mackintosh, MD   [facility] Providers Cardiologist: Eugenia Pancoast here to update MD or APP on Care Team, Refresh:1}     Patient Profile:   Anna Ewing is a 87 y.o. female with a hx of hypertension, hyperlipidemia, hyponatremia, chronic diastolic congestive heart failure who is being seen 04/23/2021 for the evaluation of worsening leg edema and CHF at the request of  Dr. Alvino Chapel.  History of Present Illness:   Anna Ewing is a 87 year old female with a history of hypertension, hyperlipidemia, hyponatremia, diastolic CHF.  She was recently admitted to [facility] and was discharged on May 31.  She was being treated for heart failure and hyponatremia at the time.  Patient was very weak and at the time of discharge it was recommended that she go to a skilled nursing facility.  The family instead decided to take her home.  She developed worsening shortness of breath and worsening leg edema.  She now presents for further treatment for her leg edema and CHF.  She has diuresed 1.4 liters so far this admission.  Echo shows overall well preserved LV function with severe hypokinesis of the inferior base.  The aortic valve is heavily calcified and has Moderate AS There is moderate MR.    She appears to have mild dementia.   Had to repeat questions several times.   She may just be hard of hearing      Past Medical History:  Diagnosis Date  . Chronic rhinitis   . Hyperlipidemia   . Hypertension     Past Surgical History:  Procedure Laterality Date  . INTRAMEDULLARY (IM) NAIL INTERTROCHANTERIC Left 03/12/2020   Procedure: INTRAMEDULLARY (IM) NAIL INTERTROCHANTRIC;  Surgeon: Roby Lofts, MD;  Location: [facility] OR;  Service: Orthopedics;  Laterality: Left;  Marland Kitchen MANDIBLE SURGERY     cosmetic  . NASAL SINUS SURGERY    . ORIF HUMERUS FRACTURE  Left 03/12/2020   Procedure: OPEN REDUCTION INTERNAL FIXATION (ORIF) PROXIMAL HUMERUS FRACTURE;  Surgeon: Roby Lofts, MD;  Location: [facility] OR;  Service: Orthopedics;  Laterality: Left;  Marland Kitchen VESICOVAGINAL FISTULA CLOSURE W/ TAH  2005     Home Medications:  Prior to Admission medications   Medication Sig Start Date End Date Taking? Authorizing Provider  amLODipine (NORVASC) 5 MG tablet TAKE 1 TABLET BY MOUTH EVERY DAY Patient taking differently: Take 5 mg by mouth daily. 01/30/21  Yes Baxley, [name], MD  ergocalciferol (DRISDOL) 1.25 MG (50000 UT) capsule One po weekly for Vitamin D deficiency Patient taking differently: Take 50,000 Units by mouth once a week. 08/17/20  Yes Baxley, [name], MD  ibandronate (BONIVA) 150 MG tablet Take 1 tablet (150 mg total) by mouth every 30 (thirty) days. Take in the morning with a full glass of water, on an empty stomach, and do not take anything else by mouth or lie down for the next 30 min. Patient taking differently: Take 150 mg by mouth See admin instructions. Take in the morning with a full glass of water, on an empty stomach, and do not take anything else by mouth or lie down for the next 30 min. 08/17/20  Yes Baxley, [name], MD  metoprolol tartrate (LOPRESSOR) 25 MG tablet Take 12.5 mg by mouth 2 (two) times daily.   Yes [provider]  rosuvastatin (CRESTOR) 5 MG tablet TAKE  1 TABLET BY MOUTH EVERY DAY Patient taking differently: Take 5 mg by mouth daily. 09/21/20  Yes Baxley, [name], MD  sodium chloride 1 g tablet Take 1 g by mouth 2 (two) times daily with a meal.   Yes [provider]  Cholecalciferol (VITAMIN D) 125 MCG (5000 UT) CAPS Take 5,000 Units by mouth daily. Patient not taking: No sig reported 03/14/20   Despina Hidden, PA-C  fluticasone [facility]) 50 MCG/ACT nasal spray 1 or 2 sprays each nostril, once daily at bedtime Patient not taking: No sig reported 07/01/11   Jetty Duhamel D, MD    Inpatient Medications: Scheduled  Meds: . enoxaparin (LOVENOX) injection  40 mg Subcutaneous Q24H  . metoprolol tartrate  12.5 mg Oral BID  . rosuvastatin  5 mg Oral Daily  . sodium chloride flush  3 mL Intravenous Q12H  . Vitamin D (Ergocalciferol)  50,000 Units Oral Weekly   Continuous Infusions: . sodium chloride     PRN Meds: sodium chloride, acetaminophen, food thickener, ondansetron (ZOFRAN) IV, sodium chloride flush  Allergies:    Allergies  Allergen Reactions  . Chocolate Nausea And Vomiting  . Amoxicillin Rash    Social History:   Social History   Socioeconomic History  . Marital status: Married    Spouse name: Not on file  . Number of children: 2  . Years of education: Not on file  . Highest education level: Not on file  Occupational History  . Occupation: Retired    Associate Professor: OTHER    Comment: First Engineer, site  Tobacco Use  . Smoking status: Former Smoker    Packs/day: 0.50    Years: 10.00    Pack years: 5.00    Types: Cigarettes    Quit date: 09/13/1974    Years since quitting: 46.6  . Smokeless tobacco: Never Used  Substance and Sexual Activity  . Alcohol use: Never  . Drug use: Never  . Sexual activity: Not on file  Other Topics Concern  . Not on file  Social History Narrative  . Not on file   Social Determinants of Health   Financial Resource Strain: Not on file  Food Insecurity: Not on file  Transportation Needs: Not on file  Physical Activity: Not on file  Stress: Not on file  Social Connections: Not on file  Intimate Partner Violence: Not on file    Family History:    Family History  Problem Relation Age of Onset  . Cancer Mother   . Heart disease Mother   . Heart disease Father      ROS:  Please see the history of present illness.   All other ROS reviewed and negative.     Physical Exam/Data:   Vitals:   04/23/21 0230 04/23/21 0450 04/23/21 1007 04/23/21 1158  BP:  136/75 116/67 (!) 119/92  Pulse:  92 88 88  Resp:  (!) 24  18  Temp:   99 F (37.2 C) 98.4 F (36.9 C) 98.4 F (36.9 C)  TempSrc:  Oral Oral Oral  SpO2:  (!) 89% 95% 95%  Weight: 47.3 kg 46.8 kg    Height:        Intake/Output Summary (Last 24 hours) at 04/23/2021 1314 Last data filed at 04/23/2021 1220 Gross per 24 hour  Intake 240 ml  Output 800 ml  Net -560 ml   Last 3 Weights 04/23/2021 04/23/2021 04/22/2021  Weight (lbs) 103 lb 1.6 oz 104 lb 4.4 oz 106 lb 11.2  oz  Weight (kg) 46.766 kg 47.3 kg 48.4 kg     Body mass index is 18.86 kg/m.  General:  Frail, elderly female,   HEENT: normal Lymph: no adenopathy Neck: no JVD Endocrine:  No thryomegaly Vascular: No carotid bruits; FA pulses 2+ bilaterally without bruits  Cardiac:   RR, 2-3 / 6 systolic murmur radiating to the URSB and 3/6 systolic murmur radiating to left axillary line  Lungs:  Few rales,  Abd: mildly distended  Ext: trace - 1+ pitting edema  Musculoskeletal:  No deformities, BUE and BLE strength normal and equal Skin: warm and dry  Neuro:  CNs 2-12 intact - is likely hard of hearing .   I had to repeat myself many times  Psych:  Normal affect   EKG:  The EKG was personally reviewed and demonstrates:   NSR , no ST or T wave changes    Telemetry:  Telemetry was personally reviewed and demonstrates:  NSR   Relevant CV Studies:   Laboratory Data:  High Sensitivity Troponin:   Recent Labs  Lab 04/20/21 1600 04/20/21 1807  TROPONINIHS 622* 596*     Chemistry Recent Labs  Lab 04/21/21 0127 04/22/21 0406 04/23/21 0846  NA 132* 126* 127*  K 4.0 3.7 3.2*  CL 99 92* 90*  CO2 21* 24 25  GLUCOSE 105* 109* 130*  BUN 12 12 20   CREATININE 0.87 1.00 1.30*  CALCIUM 8.1* 8.0* 8.2*  GFRNONAA >60 54* 39*  ANIONGAP 12 10 12     Recent Labs  Lab 04/20/21 1600  PROT 5.9*  ALBUMIN 2.1*  AST 32  ALT 22  ALKPHOS 51  BILITOT 0.6   Hematology Recent Labs  Lab 04/20/21 2037 04/22/21 0406 04/23/21 0846  WBC 12.5* 12.1* 11.1*  RBC 3.24* 2.92* 3.00*  HGB 9.5* 8.3* 8.6*  HCT  27.7* 24.7* 25.8*  MCV 85.5 84.6 86.0  MCH 29.3 28.4 28.7  MCHC 34.3 33.6 33.3  RDW 14.3 14.2 14.3  PLT 394 399 480*   BNP Recent Labs  Lab 04/20/21 1600  BNP 936.1*    DDimer No results for input(s): DDIMER in the last 168 hours.   Radiology/Studies:  DG Chest 2 View  Result Date: 04/20/2021 CLINICAL DATA:  Leg swelling and weakness EXAM: CHEST - 2 VIEW COMPARISON:  04/10/2021 FINDINGS: Progression of bibasilar atelectasis/infiltrate. Progression of small bilateral effusions. Mild vascular congestion without edema. Atherosclerotic aortic arch. ORIF proximal left humerus. Chronic left rib fractures. Apical scarring bilaterally. IMPRESSION: Interval increase in bibasilar atelectasis and effusions which may be due to hypoventilation or mild fluid overload. Negative for edema. Electronically Signed   By: Marlan Palauharles  Clark M.D.   On: 04/20/2021 15:50   DG Swallowing Func-Speech Pathology  Result Date: 04/22/2021 Objective Swallowing Evaluation: Type of Study: MBS-Modified Barium Swallow Study  Patient Details Name: Anna Ewing MRN: [id number] Date of Birth: 05/15/1929 Today's Date: 04/22/2021 Time: SLP Start Time (ACUTE ONLY): 1436 -SLP Stop Time (ACUTE ONLY): 1451 SLP Time Calculation (min) (ACUTE ONLY): 15 min Past Medical History: Past Medical History: Diagnosis Date . Chronic rhinitis  . Hyperlipidemia  . Hypertension  Past Surgical History: Past Surgical History: Procedure Laterality Date . INTRAMEDULLARY (IM) NAIL INTERTROCHANTERIC Left 03/12/2020  Procedure: INTRAMEDULLARY (IM) NAIL INTERTROCHANTRIC;  Surgeon: Roby LoftsHaddix, Kevin P, MD;  Location: [facility] OR;  Service: Orthopedics;  Laterality: Left; Marland Kitchen. MANDIBLE SURGERY    cosmetic . NASAL SINUS SURGERY   . ORIF HUMERUS FRACTURE Left 03/12/2020  Procedure: OPEN REDUCTION INTERNAL FIXATION (ORIF)  PROXIMAL HUMERUS FRACTURE;  Surgeon: Roby Lofts, MD;  Location: [facility] OR;  Service: Orthopedics;  Laterality: Left; Marland Kitchen VESICOVAGINAL FISTULA CLOSURE W/ TAH  2005 HPI:  Anna Ewing is a 87 y.o. female with medical history significant of hypertension, hyperlipidemia, chronic rhinitis hyponatremia, chronic diastolic CHF.  Pt was admitted with acute CHF.  CXR on 04/20/21 reported "Interval increase in bibasilar atelectasis and effusions which may be due to hypoventilation or mild fluid overload."  Subjective: Pt was pleasant and cooperative Assessment / Plan / Recommendation CHL IP CLINICAL IMPRESSIONS 04/22/2021 Clinical Impression Pt has a moderate pharyngoesophageal dysphagia with limited entrance into the UES. Despite other adequate components of her pharyngeal swallow, she does not achieve full epiglottic inversion as her epiglottis seems to be impeded as it hits her posterior pharyngeal swallow. She has a tight UES with prominent/fullness to CP that significantly reduces UES in terms of opening and duration for opening. Residue remains in the pharynx, increasing in volume as consistencies become thicker. Throat clearing throughout the study was either associated with pharyngeal residue, which pt also tried to spontaneously clear with repeated swallows, or with trace penetration that occurs mostly during the swallow. Throat clearing is largely protective of the airway, and a cue to clear her throat harder does eject any penetrates that she doesn't clear on her own. However, pt did begin to aspirate thin liquids once her pharynx was more filled with residue from purees. Although not cleared, this was at least sensed with an attempt at coughing. Recommend Dys 1 diet and nectar thick liquids for now with use of intermittent throat clearing. MD may wish to further assess anatomy around the level of the UES given impact on swallowing function.  SLP Visit Diagnosis Dysphagia, pharyngoesophageal phase (R13.14) Attention and concentration deficit following -- Frontal lobe and executive function deficit following -- Impact on safety and function Moderate aspiration risk;Risk for inadequate  nutrition/hydration   CHL IP TREATMENT RECOMMENDATION 04/22/2021 Treatment Recommendations Therapy as outlined in treatment plan below   Prognosis 04/22/2021 Prognosis for Safe Diet Advancement Fair Barriers to Reach Goals Other (Comment) Barriers/Prognosis Comment -- CHL IP DIET RECOMMENDATION 04/22/2021 SLP Diet Recommendations Dysphagia 1 (Puree) solids;Nectar thick liquid Liquid Administration via Cup;Straw Medication Administration Crushed with puree Compensations Slow rate;Small sips/bites;Clear throat after each swallow Postural Changes Seated upright at 90 degrees;Remain semi-upright after after feeds/meals (Comment)   CHL IP OTHER RECOMMENDATIONS 04/22/2021 Recommended Consults -- Oral Care Recommendations Oral care BID Other Recommendations Order thickener from pharmacy;Prohibited food (jello, ice cream, thin soups);Remove water pitcher   CHL IP FOLLOW UP RECOMMENDATIONS 04/22/2021 Follow up Recommendations Skilled Nursing facility   [facility] IP FREQUENCY AND DURATION 04/22/2021 Speech Therapy Frequency (ACUTE ONLY) min 2x/week Treatment Duration 2 weeks      CHL IP ORAL PHASE 04/22/2021 Oral Phase WFL Oral - Pudding Teaspoon -- Oral - Pudding Cup -- Oral - Honey Teaspoon -- Oral - Honey Cup -- Oral - Nectar Teaspoon -- Oral - Nectar Cup -- Oral - Nectar Straw -- Oral - Thin Teaspoon -- Oral - Thin Cup -- Oral - Thin Straw -- Oral - Puree -- Oral - Mech Soft -- Oral - Regular -- Oral - Multi-Consistency -- Oral - Pill -- Oral Phase - Comment --  CHL IP PHARYNGEAL PHASE 04/22/2021 Pharyngeal Phase Impaired Pharyngeal- Pudding Teaspoon -- Pharyngeal -- Pharyngeal- Pudding Cup -- Pharyngeal -- Pharyngeal- Honey Teaspoon -- Pharyngeal -- Pharyngeal- Honey Cup -- Pharyngeal -- Pharyngeal- Nectar Teaspoon -- Pharyngeal -- Pharyngeal- Nectar  Cup Reduced epiglottic inversion;Pharyngeal residue - pyriform;Penetration/Aspiration during swallow Pharyngeal Material enters airway, remains ABOVE vocal cords then ejected out Pharyngeal-  Nectar Straw Reduced epiglottic inversion;Pharyngeal residue - pyriform;Penetration/Aspiration during swallow Pharyngeal Material enters airway, remains ABOVE vocal cords then ejected out Pharyngeal- Thin Teaspoon -- Pharyngeal -- Pharyngeal- Thin Cup Reduced epiglottic inversion;Pharyngeal residue - pyriform;Penetration/Aspiration during swallow Pharyngeal Material enters airway, remains ABOVE vocal cords and not ejected out Pharyngeal- Thin Straw Reduced epiglottic inversion;Pharyngeal residue - pyriform;Penetration/Aspiration during swallow;Penetration/Apiration after swallow Pharyngeal Material enters airway, passes BELOW cords and not ejected out despite cough attempt by patient Pharyngeal- Puree Reduced epiglottic inversion;Pharyngeal residue - pyriform;Pharyngeal residue - valleculae;Compensatory strategies attempted (with notebox) Pharyngeal -- Pharyngeal- Mechanical Soft -- Pharyngeal -- Pharyngeal- Regular -- Pharyngeal -- Pharyngeal- Multi-consistency -- Pharyngeal -- Pharyngeal- Pill -- Pharyngeal -- Pharyngeal Comment --  CHL IP CERVICAL ESOPHAGEAL PHASE 04/22/2021 Cervical Esophageal Phase Impaired Pudding Teaspoon -- Pudding Cup -- Honey Teaspoon -- Honey Cup -- Nectar Teaspoon -- Nectar Cup Reduced cricopharyngeal relaxation;Prominent cricopharyngeal segment Nectar Straw Reduced cricopharyngeal relaxation;Prominent cricopharyngeal segment Thin Teaspoon -- Thin Cup Reduced cricopharyngeal relaxation;Prominent cricopharyngeal segment Thin Straw Reduced cricopharyngeal relaxation;Prominent cricopharyngeal segment Puree Reduced cricopharyngeal relaxation;Prominent cricopharyngeal segment Mechanical Soft -- Regular -- Multi-consistency -- Pill -- Cervical Esophageal Comment -- Mahala Menghini., M.A. CCC-SLP Acute Rehabilitation Services Pager [phone] Office [phone] 04/22/2021, 3:50 PM              ECHOCARDIOGRAM COMPLETE  Result Date: 04/22/2021    ECHOCARDIOGRAM REPORT   Patient Name:   Anna Ewing  Date of Exam: 04/22/2021 Medical Rec #:  [id number]      Height:       62.0 in Accession #:    [id number]     Weight:       106.7 lb Date of Birth:  1929/11/10       BSA:          1.464 m Patient Age:    89 years       BP:           140/73 mmHg Patient Gender: F              HR:           86 bpm. Exam Location:  Inpatient Procedure: 2D Echo, Cardiac Doppler and Color Doppler Indications:    CHF-Acute Diastolic I50.31  History:        Patient has prior history of Echocardiogram examinations, most                 recent 03/10/2018. CHF, Signs/Symptoms:Murmur; Risk                 Factors:Hypertension, Dyslipidemia and Former Smoker.  Sonographer:    Celesta Gentile RCS Referring Phys: [phone] MOHAMMAD L GARBA IMPRESSIONS  1. Diastolic annulus velocities indicate normal diastolic function, especially when adjusted for age. Left ventricular ejection fraction, by estimation, is 65 to 70%. The left ventricle has normal function. The left ventricle demonstrates regional wall motion abnormalities (see scoring diagram/findings for description). Left ventricular diastolic parameters were normal.  2. Right ventricular systolic function is normal. The right ventricular size is normal. There is mildly elevated pulmonary artery systolic pressure.  3. The mitral valve is degenerative. Moderate mitral valve regurgitation.  4. Tricuspid valve regurgitation is mild to moderate.  5. The aortic valve is tricuspid. There is severe calcifcation of the aortic valve. There is severe thickening of the aortic valve. Aortic valve regurgitation is not visualized. Moderate  aortic valve stenosis. Comparison(s): Prior images unable to be directly viewed, comparison made by report only. The left ventricular wall motion abnormality is is new. Aortic stenosis is unchanged. FINDINGS  Left Ventricle: Diastolic annulus velocities indicate normal diastolic function, especially when adjusted for age. Left ventricular ejection fraction, by estimation, is 65 to 70%.  The left ventricle has normal function. The left ventricle demonstrates regional wall motion abnormalities. Severe hypokinesis of the left ventricular, basal inferior wall and inferolateral wall. The left ventricular internal cavity size was normal in size. There is no left ventricular hypertrophy. Left ventricular diastolic  parameters were normal. Normal left ventricular filling pressure. Right Ventricle: The right ventricular size is normal. No increase in right ventricular wall thickness. Right ventricular systolic function is normal. There is mildly elevated pulmonary artery systolic pressure. The tricuspid regurgitant velocity is 2.88  m/s, and with an assumed right atrial pressure of 3 mmHg, the estimated right ventricular systolic pressure is 36.2 mmHg. Left Atrium: Left atrial size was normal in size. Right Atrium: Right atrial size was normal in size. Prominent Eustachian valve. Pericardium: There is no evidence of pericardial effusion. Mitral Valve: The mitral valve is degenerative in appearance. There is mild thickening of the mitral valve leaflet(s). Mild mitral annular calcification. Moderate mitral valve regurgitation, with centrally-directed jet. Tricuspid Valve: The tricuspid valve is normal in structure. Tricuspid valve regurgitation is mild to moderate. Aortic Valve: The aortic valve is tricuspid. There is severe calcifcation of the aortic valve. There is severe thickening of the aortic valve. Aortic valve regurgitation is not visualized. Moderate aortic stenosis is present. Aortic valve mean gradient measures 21.7 mmHg. Aortic valve peak gradient measures 38.7 mmHg. Aortic valve area, by VTI measures 1.29 cm. Pulmonic Valve: The pulmonic valve was grossly normal. Pulmonic valve regurgitation is not visualized. Aorta: The aortic root is normal in size and structure. IAS/Shunts: No atrial level shunt detected by color flow Doppler.  LEFT VENTRICLE PLAX 2D LVIDd:         4.00 cm  Diastology LVIDs:          2.20 cm  LV e' medial:    5.44 cm/s LV PW:         0.90 cm  LV E/e' medial:  11.6 LV IVS:        1.05 cm  LV e' lateral:   12.60 cm/s LVOT diam:     1.94 cm  LV E/e' lateral: 5.0 LV SV:         78 LV SV Index:   53 LVOT Area:     2.96 cm  RIGHT VENTRICLE RV S prime:     12.60 cm/s TAPSE (M-mode): 2.1 cm LEFT ATRIUM             Index       RIGHT ATRIUM           Index LA diam:        3.10 cm 2.12 cm/m  RA Area:     16.50 cm LA Vol (A2C):   35.3 ml 24.12 ml/m RA Volume:   42.70 ml  29.17 ml/m LA Vol (A4C):   44.0 ml 30.06 ml/m LA Biplane Vol: 43.0 ml 29.38 ml/m  AORTIC VALVE AV Area (Vmax):    1.11 cm AV Area (Vmean):   1.17 cm AV Area (VTI):     1.29 cm AV Vmax:           311.00 cm/s AV Vmean:  215.333 cm/s AV VTI:            0.603 m AV Peak Grad:      38.7 mmHg AV Mean Grad:      21.7 mmHg LVOT Vmax:         117.00 cm/s LVOT Vmean:        85.100 cm/s LVOT VTI:          0.263 m LVOT/AV VTI ratio: 0.44  AORTA Ao Root diam: 3.30 cm MITRAL VALVE                TRICUSPID VALVE MV Area (PHT): 2.66 cm     TR Peak grad:   33.2 mmHg MV Decel Time: 285 msec     TR Vmax:        288.00 cm/s MR Peak grad: 141.1 mmHg MR Mean grad: 97.0 mmHg     SHUNTS MR Vmax:      594.00 cm/s   Systemic VTI:  0.26 m MR Vmean:     475.0 cm/s    Systemic Diam: 1.94 cm MV E velocity: 63.10 cm/s MV A velocity: 115.00 cm/s MV E/A ratio:  0.55 Mihai Croitoru MD Electronically signed by Thurmon Fair MD Signature Date/Time: 04/22/2021/3:13:43 PM    Final      Assessment and Plan:   1. Acute on Chronic CHF - she has well preserved systolic function .   I suspect she has some diastolic dysfunction as well . She has moderate AS and MR and has mild pulmonary HTN IV lasix was stopped today. She received a dose today  May need PO started in the next day or so   Potassium is 3.2   I suspect her heart failure symptoms are multifactorial - AS, MR, ? Diastolic dysfunction , excessive free water intake   She appears to  have some degree of dementia and I'm not sure if she will adhere to water restriction  2.  Hypokalemia : will give potassium packet - 20 meq bid for several days .  3.  Aortic stenosis:   Moderate  4.  Mitral regurgitation ;  5.  Inferior hypokinesis:   Her overall LV function is well preserved.  She may have had a silent inferior MI . She has no angina. She is not a candidate for invasive procedures so I do not recommend any further ischemic evaluation    Risk Assessment/Risk Scores:         New York Heart Association (NYHA) Functional Class NYHA Class II        For questions or updates, please contact [facility] Please consult [email or web address] for contact info under    Signed, Kristeen Miss, MD  04/23/2021 1:14 PM

## 2021-04-23 NOTE — Progress Notes (Signed)
  Speech Language Pathology Treatment: Dysphagia  Patient Details Name: Anna Ewing MRN: [id number] DOB: 01/28/1929 Today's Date: 04/23/2021 Time: 4081-4481 SLP Time Calculation (min) (ACUTE ONLY): 18 min  Assessment / Plan / Recommendation Clinical Impression  Pt was seen for f/u after MBS on previous date with son present for education. Results were reinforced, including reduced UES clearance that impedes bolus flow. Images were reviewed again today, this time with radiologist who agrees with prominent CP. Swallowing recommendations may therefore have to be more compensatory in nature, including current modifications. Pt has ongoing throat clearing at baseline and while consuming nectar thick liquids and purees, but there is no overt coughing, which was indicative of aspiration during the study. Pt's son also verbalizes that she looks "much better" swallowing current diet. Will continue with current diet and precautions for now.    HPI HPI: Anna Ewing is a 87 y.o. female with medical history significant of hypertension, hyperlipidemia, chronic rhinitis hyponatremia, chronic diastolic CHF.  Pt was admitted with acute CHF.  CXR on 04/20/21 reported "Interval increase in bibasilar atelectasis and effusions which may be due to hypoventilation or mild fluid overload."      SLP Plan  Continue with current plan of care       Recommendations  Diet recommendations: Dysphagia 1 (puree);Nectar-thick liquid Liquids provided via: Straw;Cup Medication Administration: Crushed with puree Supervision: Patient able to self feed;Full supervision/cueing for compensatory strategies Compensations: Slow rate;Small sips/bites;Clear throat after each swallow Postural Changes and/or Swallow Maneuvers: Seated upright 90 degrees;Upright 30-60 min after meal                Oral Care Recommendations: Oral care BID Follow up Recommendations: Skilled Nursing facility SLP Visit Diagnosis: Dysphagia,  pharyngoesophageal phase (R13.14) Plan: Continue with current plan of care       GO                Mahala Menghini., M.A. CCC-SLP Acute Rehabilitation Services Pager [phone] Office [phone]  04/23/2021, 1:47 PM

## 2021-04-23 NOTE — Progress Notes (Signed)
Nutrition Follow-up  DOCUMENTATION CODES:   Non-severe (moderate) malnutrition in context of chronic illness  INTERVENTION:   -Nepro Shake po BID, each supplement provides 425 kcal and 19 grams protein -Continue Magic cup TID with meals, each supplement provides 290 kcal and 9 grams of protein -MVI with minerals daily  NUTRITION DIAGNOSIS:   Moderate Malnutrition related to chronic illness (CHF) as evidenced by energy intake < or equal to 75% for > or equal to 1 month,mild fat depletion,mild muscle depletion,moderate muscle depletion,edema.  Ongoing  GOAL:   Patient will meet greater than or equal to 90% of their needs  Progressing   MONITOR:   PO intake,Supplement acceptance,Diet advancement,Labs,Weight trends,Skin,I & O's  REASON FOR ASSESSMENT:   Malnutrition Screening Tool    ASSESSMENT:   87 yo female admitted with CHF exacerbation. PMH includes HTN, HLD, CHF.  6/5- s/p MBSS- dysphagia 1 diet with nectar thick liquids  Reviewed I/O's: +183 ml x 24 hours and -877 ml since admission  UOP: 300 ml x 24 hours  Spoke with pt and son at bedside. Pt son reports a general decline in health over the past month. He explains that pt was admitted to [facility] last month for similar complaints. Since last hospital visit, pt with very poor oral intake. Per son, she was only eating bites and sips for about one week PTA and noticed coughing when trying to swallow. She consumed 2 cups of applesauce this morning. Noted meal completion 40-85%.   Pt son unsure of wt loss. Suspect edema may be masking true weight loss as well as further fat and muscle depletion.   Reviewed dysphagia 1 diet with nectar thick liquids. Lunch tray arrived during RD visit and encouraged pt to eat. They are amenable to continue Magic Cups.   Medications reviewed and include lasix and vitamin D.   Labs reviewed: Na: 126.   NUTRITION - FOCUSED PHYSICAL EXAM:  Flowsheet Row Most Recent Value   Orbital Region Moderate depletion  Upper Arm Region Mild depletion  Thoracic and Lumbar Region No depletion  Buccal Region Mild depletion  Temple Region Moderate depletion  Clavicle Bone Region Mild depletion  Clavicle and Acromion Bone Region Mild depletion  Scapular Bone Region Mild depletion  Dorsal Hand Mild depletion  Patellar Region Moderate depletion  Anterior Thigh Region Moderate depletion  Posterior Calf Region Moderate depletion  Edema (RD Assessment) Mild  Hair Reviewed  Eyes Reviewed  Mouth Reviewed  Skin Reviewed  Nails Reviewed       Diet Order:   Diet Order            DIET - DYS 1 Room service appropriate? No; Fluid consistency: Nectar Thick; Fluid restriction: 2000 mL Fluid  Diet effective now                 EDUCATION NEEDS:   Education needs have been addressed  Skin:  Skin Assessment: Skin Integrity Issues: Skin Integrity Issues:: Stage II Stage II: vertebral column  Last BM:  04/22/21  Height:   Ht Readings from Last 1 Encounters:  04/20/21 5\' 2"  (1.575 m)    Weight:   Wt Readings from Last 1 Encounters:  04/23/21 46.8 kg    Ideal Body Weight:  50 kg  BMI:  Body mass index is 18.86 kg/m.  Estimated Nutritional Needs:   Kcal:  1450-1650  Protein:  55-70 grams  Fluid:  > 1.4 L    06/23/21, RD, LDN, CDCES Registered Dietitian II Certified Diabetes  Care and Education Specialist Please refer to [facility] for RD and/or RD on-call/weekend/after hours pager

## 2021-04-23 NOTE — Evaluation (Signed)
Occupational Therapy Evaluation Patient Details Name: Anna Ewing MRN: [id number] DOB: 1929/11/08 Today's Date: 04/23/2021    History of Present Illness Pt is a 87 y/o female referred to the ER by her PCP for lower extremity edema and SOB. Pt admitted on 04/20/2021 with acute CHF. PMH significant of hypertension, hyperlipidemia, chronic rhinitis hyponatremia, chronic diastolic CHF who was recently hospitalized at [facility] for hyponatremia CHF and discharged on May 31. Chest x-ray showed interval increase in bibasilar atelectasis and mild fluid overload.   Clinical Impression   Pt presents with diagnoses above and deficits in cognition, standing balance, strength and endurance. Pt A&Ox1, so unable to accurately report PLOF, support at home or home setup. Pt overall Mod A for bed mobility, Min A for pivot transfers using RW with consistent hands on assist needed to safely guide pt from surface to surface. Pt requires Min A for UB ADLs and Max A for LB ADLs, including toileting hygiene after experiencing bowel incontinence during session. Pt presents as high fall risk and unable to safely care for self without 24/7 assist, so recommend DC to SNF for short term rehab. VSS on RA.     Follow Up Recommendations  SNF;Supervision/Assistance - 24 hour    Equipment Recommendations  3 in 1 bedside commode;Other (comment) (Rolling walker)    Recommendations for Other Services       Precautions / Restrictions Precautions Precautions: Fall Precaution Comments: Pt incontinent bowel and bladder Restrictions Weight Bearing Restrictions: No      Mobility Bed Mobility Overal bed mobility: Needs Assistance Bed Mobility: Supine to Sit     Supine to sit: Mod assist;HOB elevated     General bed mobility comments: Mod A to advance B LE to EOB and scoot hips forward    Transfers Overall transfer level: Needs assistance Equipment used: Rolling walker (2 wheeled) Transfers: Sit to/from  [employer] Sit to Stand: Mod assist Stand pivot transfers: Min assist       General transfer comment: MIn A to Mod A for sit to stands during session with cues needed for hand placement. Pt requirs Min A for pivot transfers with assist for RW mgmt and guiding hips fully to surface prior to sitting    Balance Overall balance assessment: Needs assistance Sitting-balance support: Feet supported Sitting balance-Leahy Scale: Fair     Standing balance support: Bilateral upper extremity supported;During functional activity Standing balance-Leahy Scale: Poor Standing balance comment: Pt reliant on UE support to maintain standing balance.                           ADL either performed or assessed with clinical judgement   ADL Overall ADL's : Needs assistance/impaired Eating/Feeding: Set up;Sitting Eating/Feeding Details (indicate cue type and reason): spitting out eggs, frequent secretions and need to spit during session Grooming: Minimal assistance;Sitting   Upper Body Bathing: Minimal assistance;Sitting   Lower Body Bathing: Maximal assistance;Sit to/from stand   Upper Body Dressing : Minimal assistance;Sitting Upper Body Dressing Details (indicate cue type and reason): Min A to don new hospital gown Lower Body Dressing: Maximal assistance;Sit to/from stand Lower Body Dressing Details (indicate cue type and reason): Max A to don new mesh underwear/pad Toilet Transfer: Minimal Science writer Details (indicate cue type and reason): MInA for balance and RW use, pt noted to leave RW behind and attempt premature sitting Toileting- Clothing Manipulation and Hygiene: Maximal assistance;Sit to/from stand;Sitting/lateral lean Toileting - Clothing Manipulation  Details (indicate cue type and reason): Pt able to assist with peri care though Max A needed for thorough peri care after bowel incontinence       General ADL Comments: Pt  limited by cognitive deficits, decreased knowledge of DME use, weakness and balance deficits. SpO2 94% on RA     Vision Patient Visual Report: No change from baseline Vision Assessment?: No apparent visual deficits     Perception     Praxis      Pertinent Vitals/Pain Pain Assessment: Faces Faces Pain Scale: Hurts a little bit Pain Location: B LE with bed mobility Pain Descriptors / Indicators: Sore Pain Intervention(s): Monitored during session     Hand Dominance Right   Extremity/Trunk Assessment Upper Extremity Assessment Upper Extremity Assessment: Generalized weakness   Lower Extremity Assessment Lower Extremity Assessment: Defer to PT evaluation   Cervical / Trunk Assessment Cervical / Trunk Assessment: Kyphotic   Communication Communication Communication: HOH   Cognition Arousal/Alertness: Awake/alert Behavior During Therapy: WFL for tasks assessed/performed Overall Cognitive Status: No family/caregiver present to determine baseline cognitive functioning                                 General Comments: Pt reports it is January 2022. unsure of where she is but when given options, chose [facility]. Pt reports unable to recall when husband passed. repeats self and requires cues for attending to tasks   General Comments  O2 94% on RA, HR WFL. Pt with frequent need to spit during session    Exercises     Shoulder Instructions      Home Living Family/patient expects to be discharged to:: Private residence Living Arrangements: Alone Available Help at Discharge: Family;Available PRN/intermittently (daughter and son) Type of Home: House Home Access: Level entry     Home Layout: Two level Alternate Level Stairs-Number of Steps: flight Alternate Level Stairs-Rails: Right Bathroom Shower/Tub: Chief Strategy Officer: Standard Bathroom Accessibility: Yes   Home Equipment: Cane - single point   Additional Comments: Pt has  difficulty recalling home living history so info obtained from chart. Pt reports her husband recently passed (admission in 2021 reports pt lived with husband) but pt unsure if anyone else lives with her.      Prior Functioning/Environment Level of Independence: Needs assistance        Comments: Pt reports she is able to perform ADLs independently and uses a cane for mobility. Unsure if this is the case as pt poor historian        OT Problem List: Decreased strength;Decreased activity tolerance;Impaired balance (sitting and/or standing);Decreased cognition;Decreased safety awareness;Decreased knowledge of use of DME or AE      OT Treatment/Interventions: Self-care/ADL training;Therapeutic exercise;DME and/or AE instruction;Therapeutic activities;Patient/family education;Balance training    OT Goals(Current goals can be found in the care plan section) Acute Rehab OT Goals Patient Stated Goal: try to eat some food even though not very hungry OT Goal Formulation: With patient Time For Goal Achievement: 05/07/21 Potential to Achieve Goals: Fair ADL Goals Pt Will Perform Grooming: with supervision;standing Pt Will Perform Upper Body Dressing: with supervision;sitting Pt Will Perform Lower Body Dressing: with min assist;sit to/from stand;sitting/lateral leans Pt Will Transfer to Toilet: with min guard assist;ambulating  OT Frequency: Min 2X/week   Barriers to D/C:            Co-evaluation  AM-PAC OT "6 Clicks" Daily Activity     Outcome Measure Help from another person eating meals?: A Little Help from another person taking care of personal grooming?: A Little Help from another person toileting, which includes using toliet, bedpan, or urinal?: A Lot Help from another person bathing (including washing, rinsing, drying)?: A Lot Help from another person to put on and taking off regular upper body clothing?: A Little Help from another person to put on and taking off  regular lower body clothing?: A Lot 6 Click Score: 15   End of Session Equipment Utilized During Treatment: Rolling walker Nurse Communication: Mobility status;Other (comment) (need for purewick)  Activity Tolerance: Patient tolerated treatment well Patient left: in chair;with call bell/phone within reach;with chair alarm set  OT Visit Diagnosis: Unsteadiness on feet (R26.81);Other abnormalities of gait and mobility (R26.89);Muscle weakness (generalized) (M62.81);Other symptoms and signs involving cognitive function                Time: 7867-6720 OT Time Calculation (min): 28 min Charges:  OT General Charges $OT Visit: 1 Visit OT Evaluation $OT Eval Moderate Complexity: 1 Mod OT Treatments $Self Care/Home Management : 8-22 mins  Bradd Canary, OTR/L Acute Rehab Services Office: [phone]  Lorre Munroe 04/23/2021, 8:24 AM

## 2021-04-23 NOTE — Progress Notes (Addendum)
PROGRESS NOTE    Anna Ewing  XNA:[id number] DOB: October 08, 1929 DOA: 04/20/2021 PCP: Margaree Mackintosh, MD     Brief Narrative:  Anna Ewing is a 87 y.o. female with medical history significant of hypertension, hyperlipidemia, chronic rhinitis hyponatremia, chronic diastolic CHF who was recently hospitalized at [facility] and discharged on May 31.  At that point she was being treated for hyponatremia and CHF.  Patient was weak and skilled nursing facility was recommended but family decided to take patient home.  She went home and had worsening lower extremity edema.  Also some shortness of breath.  Was seen and evaluated by her PCP today who sent her over to the ER.  In the ER patient was noted to have 3+ pedal edema.  Also some crackles and evidence of fluid overload.  She still has some mild hyponatremia with sodium of 130.  Per patient the legs were not as swollen prior to yesterday.  Son concurred.  The swelling started mostly within the last 24 to 48 hours.  She was admitted for CHF exacerbation.  New events last 24 hours / Subjective: Patient just finished with OT, sitting in a recliner, off O2 and doing well satting 94%. She has no complaints today, denies CP, SOB. Doesn't think she had any CP at [facility] either. About to eat breakfast.   Assessment & Plan:   Principal Problem:   CHF (congestive heart failure), NYHA class I, acute on chronic, diastolic (HCC) Active Problems:   Hyperlipemia   Essential hypertension   Hearing loss   Hyponatremia   Anemia   Pressure injury of skin   Acute on chronic diastolic heart failure -BNP 936.1 -Chest x-ray showed interval increase in bibasilar atelectasis and effusion -Repeat echocardiogram showed EF 65 to 70%,, normal diastolic function, did reveal LV regional wall motion abnormalities -Continue IV Lasix -Strict I's and O's, daily weight, fluid and sodium restriction diet -Records from [facility] hospitalization has  not been received yet, asked RN to look into this again  Chronic hyponatremia -Hypervolemic -Monitor on Lasix.  Continue sodium restriction diet -BMP pending this morning  Hypertension -Continue metoprolol -Holding amlodipine in setting of peripheral edema   Hyperlipidemia -Continue Crestor  Demand ischemia -Troponin trend has been flat 622 > 596 -Due to findings of wall motion abnormalities on echocardiogram, cardiology was consulted.  Patient continues to deny any chest pain  Dysphagia -Continue dysphagia diet -MBS did reveal limited entrance into UES, may need outpatient work-up and follow-up  Deconditioning -PT OT recommending SNF placement   In agreement with assessment of the pressure ulcer as below:  Pressure Injury 04/20/21 Vertebral column Mid Stage 2 -  Partial thickness loss of dermis presenting as a shallow open injury with a red, pink wound bed without slough. 0.2 diameter, no depth or drainage. White in color (Active)  04/20/21 2000  Location: Vertebral column  Location Orientation: Mid  Staging: Stage 2 -  Partial thickness loss of dermis presenting as a shallow open injury with a red, pink wound bed without slough.  Wound Description (Comments): 0.2 diameter, no depth or drainage. White in color  Present on Admission: Yes     Nutrition Problem: Increased nutrient needs Etiology: wound healing   DVT prophylaxis:  Place TED hose Start: 04/22/21 1012 enoxaparin (LOVENOX) injection 40 mg Start: 04/20/21 2115  Code Status:     Code Status Orders  (From admission, onward)         Start  Ordered   04/20/21 2029  Full code  Continuous        04/20/21 2028        Code Status History    Date Active Date Inactive Code Status Order ID Comments User Context   03/12/2020 1415 03/16/2020 2011 Full Code [id number]  Emeline General, MD ED   Advance Care Planning Activity     Family Communication: None at bedside, spoke with daughter over the phone   Disposition Plan:  Status is: Inpatient  Remains inpatient appropriate because:IV treatments appropriate due to intensity of illness or inability to take PO and Inpatient level of care appropriate due to severity of illness   Dispo: The patient is from: Home              Anticipated d/c is to: SNF              Patient currently is not medically stable to d/c.  Cardiology consulted today   Difficult to place patient No      Consultants:   Cardiology  Procedures:   None   Antimicrobials:  Anti-infectives (From admission, onward)   None       Objective: Vitals:   04/22/21 1200 04/22/21 2020 04/23/21 0230 04/23/21 0450  BP:  125/70  136/75  Pulse:  96  92  Resp:  18  (!) 24  Temp:  97.8 F (36.6 C)  99 F (37.2 C)  TempSrc:  Oral  Oral  SpO2: 97% 94%  (!) 89%  Weight:   47.3 kg 46.8 kg  Height:        Intake/Output Summary (Last 24 hours) at 04/23/2021 0938 Last data filed at 04/22/2021 1930 Gross per 24 hour  Intake 480 ml  Output 300 ml  Net 180 ml   Filed Weights   04/22/21 0500 04/23/21 0230 04/23/21 0450  Weight: 48.4 kg 47.3 kg 46.8 kg    Examination: General exam: Appears calm and comfortable  Respiratory system: Diminished breath sounds, and no respiratory distress, on room air Cardiovascular system: S1 & S2 heard, RRR. + Murmur.  Trace pedal edema. Gastrointestinal system: Abdomen is nondistended, soft and nontender. Normal bowel sounds heard. Central nervous system: Alert and oriented. Non focal exam. Speech clear  Extremities: Symmetric in appearance bilaterally  Skin: No rashes, lesions or ulcers on exposed skin  Psychiatry: Judgement and insight appear stable. Mood & affect appropriate.    Data Reviewed: I have personally reviewed following labs and imaging studies  CBC: Recent Labs  Lab 04/20/21 1600 04/20/21 2037 04/22/21 0406  WBC 12.1* 12.5* 12.1*  NEUTROABS 9.4*  --   --   HGB 8.4* 9.5* 8.3*  HCT 25.6* 27.7* 24.7*  MCV 87.4  85.5 84.6  PLT 410* 394 399   Basic Metabolic Panel: Recent Labs  Lab 04/20/21 1600 04/20/21 2037 04/21/21 0127 04/22/21 0406  NA 130*  --  132* 126*  K 3.2*  --  4.0 3.7  CL 99  --  99 92*  CO2 23  --  21* 24  GLUCOSE 119*  --  105* 109*  BUN 14  --  12 12  CREATININE 0.83 0.88 0.87 1.00  CALCIUM 8.0*  --  8.1* 8.0*   GFR: Estimated Creatinine Clearance: 27.6 mL/min (by C-G formula based on SCr of 1 mg/dL). Liver Function Tests: Recent Labs  Lab 04/20/21 1600  AST 32  ALT 22  ALKPHOS 51  BILITOT 0.6  PROT 5.9*  ALBUMIN 2.1*   No  results for input(s): LIPASE, AMYLASE in the last 168 hours. No results for input(s): AMMONIA in the last 168 hours. Coagulation Profile: No results for input(s): INR, PROTIME in the last 168 hours. Cardiac Enzymes: No results for input(s): CKTOTAL, CKMB, CKMBINDEX, TROPONINI in the last 168 hours. BNP (last 3 results) No results for input(s): PROBNP in the last 8760 hours. HbA1C: No results for input(s): HGBA1C in the last 72 hours. CBG: No results for input(s): GLUCAP in the last 168 hours. Lipid Profile: No results for input(s): CHOL, HDL, LDLCALC, TRIG, CHOLHDL, LDLDIRECT in the last 72 hours. Thyroid Function Tests: No results for input(s): TSH, T4TOTAL, FREET4, T3FREE, THYROIDAB in the last 72 hours. Anemia Panel: No results for input(s): VITAMINB12, FOLATE, FERRITIN, TIBC, IRON, RETICCTPCT in the last 72 hours. Sepsis Labs: No results for input(s): PROCALCITON, LATICACIDVEN in the last 168 hours.  Recent Results (from the past 240 hour(s))  Resp Panel by RT-PCR (Flu A&B, Covid) Nasopharyngeal Swab     Status: None   Collection Time: 04/20/21  4:15 PM   Specimen: Nasopharyngeal Swab; Nasopharyngeal(NP) swabs in vial transport medium  Result Value Ref Range Status   SARS Coronavirus 2 by RT PCR NEGATIVE NEGATIVE Final    Comment: (NOTE) SARS-CoV-2 target nucleic acids are NOT DETECTED.  The SARS-CoV-2 RNA is generally  detectable in upper respiratory specimens during the acute phase of infection. The lowest concentration of SARS-CoV-2 viral copies this assay can detect is 138 copies/mL. A negative result does not preclude SARS-Cov-2 infection and should not be used as the sole basis for treatment or other patient management decisions. A negative result may occur with  improper specimen collection/handling, submission of specimen other than nasopharyngeal swab, presence of viral mutation(s) within the areas targeted by this assay, and inadequate number of viral copies(<138 copies/mL). A negative result must be combined with clinical observations, patient history, and epidemiological information. The expected result is Negative.  Fact Sheet for Patients:  BloggerCourse.com  Fact Sheet for Healthcare Providers:  SeriousBroker.it  This test is no t yet approved or cleared by the [place] FDA and  has been authorized for detection and/or diagnosis of SARS-CoV-2 by FDA under an Emergency Use Authorization (EUA). This EUA will remain  in effect (meaning this test can be used) for the duration of the COVID-19 declaration under Section 564(b)(1) of the Act, 21 U.S.C.section 360bbb-3(b)(1), unless the authorization is terminated  or revoked sooner.       Influenza A by PCR NEGATIVE NEGATIVE Final   Influenza B by PCR NEGATIVE NEGATIVE Final    Comment: (NOTE) The Xpert Xpress SARS-CoV-2/FLU/RSV plus assay is intended as an aid in the diagnosis of influenza from Nasopharyngeal swab specimens and should not be used as a sole basis for treatment. Nasal washings and aspirates are unacceptable for Xpert Xpress SARS-CoV-2/FLU/RSV testing.  Fact Sheet for Patients: BloggerCourse.com  Fact Sheet for Healthcare Providers: SeriousBroker.it  This test is not yet approved or cleared by the [place] FDA  and has been authorized for detection and/or diagnosis of SARS-CoV-2 by FDA under an Emergency Use Authorization (EUA). This EUA will remain in effect (meaning this test can be used) for the duration of the COVID-19 declaration under Section 564(b)(1) of the Act, 21 U.S.C. section 360bbb-3(b)(1), unless the authorization is terminated or revoked.  Performed at [facility], [address] [place], [place] [address]       Radiology Studies: DG Swallowing Func-Speech Pathology  Result Date: 04/22/2021 Objective Swallowing  Evaluation: Type of Study: MBS-Modified Barium Swallow Study  Patient Details Name: Anna Ewing MRN: [id number] Date of Birth: 07/10/1929 Today's Date: 04/22/2021 Time: SLP Start Time (ACUTE ONLY): 1436 -SLP Stop Time (ACUTE ONLY): 1451 SLP Time Calculation (min) (ACUTE ONLY): 15 min Past Medical History: Past Medical History: Diagnosis Date . Chronic rhinitis  . Hyperlipidemia  . Hypertension  Past Surgical History: Past Surgical History: Procedure Laterality Date . INTRAMEDULLARY (IM) NAIL INTERTROCHANTERIC Left 03/12/2020  Procedure: INTRAMEDULLARY (IM) NAIL INTERTROCHANTRIC;  Surgeon: Roby LoftsHaddix, Kevin P, MD;  Location: [facility] OR;  Service: Orthopedics;  Laterality: Left; Marland Kitchen. MANDIBLE SURGERY    cosmetic . NASAL SINUS SURGERY   . ORIF HUMERUS FRACTURE Left 03/12/2020  Procedure: OPEN REDUCTION INTERNAL FIXATION (ORIF) PROXIMAL HUMERUS FRACTURE;  Surgeon: Roby LoftsHaddix, Kevin P, MD;  Location: [facility] OR;  Service: Orthopedics;  Laterality: Left; Marland Kitchen. VESICOVAGINAL FISTULA CLOSURE W/ TAH  2005 HPI: Anna Ewing is a 87 y.o. female with medical history significant of hypertension, hyperlipidemia, chronic rhinitis hyponatremia, chronic diastolic CHF.  Pt was admitted with acute CHF.  CXR on 04/20/21 reported "Interval increase in bibasilar atelectasis and effusions which may be due to hypoventilation or mild fluid overload."  Subjective: Pt was pleasant and cooperative Assessment / Plan / Recommendation  CHL IP CLINICAL IMPRESSIONS 04/22/2021 Clinical Impression Pt has a moderate pharyngoesophageal dysphagia with limited entrance into the UES. Despite other adequate components of her pharyngeal swallow, she does not achieve full epiglottic inversion as her epiglottis seems to be impeded as it hits her posterior pharyngeal swallow. She has a tight UES with prominent/fullness to CP that significantly reduces UES in terms of opening and duration for opening. Residue remains in the pharynx, increasing in volume as consistencies become thicker. Throat clearing throughout the study was either associated with pharyngeal residue, which pt also tried to spontaneously clear with repeated swallows, or with trace penetration that occurs mostly during the swallow. Throat clearing is largely protective of the airway, and a cue to clear her throat harder does eject any penetrates that she doesn't clear on her own. However, pt did begin to aspirate thin liquids once her pharynx was more filled with residue from purees. Although not cleared, this was at least sensed with an attempt at coughing. Recommend Dys 1 diet and nectar thick liquids for now with use of intermittent throat clearing. MD may wish to further assess anatomy around the level of the UES given impact on swallowing function.  SLP Visit Diagnosis Dysphagia, pharyngoesophageal phase (R13.14) Attention and concentration deficit following -- Frontal lobe and executive function deficit following -- Impact on safety and function Moderate aspiration risk;Risk for inadequate nutrition/hydration   CHL IP TREATMENT RECOMMENDATION 04/22/2021 Treatment Recommendations Therapy as outlined in treatment plan below   Prognosis 04/22/2021 Prognosis for Safe Diet Advancement Fair Barriers to Reach Goals Other (Comment) Barriers/Prognosis Comment -- CHL IP DIET RECOMMENDATION 04/22/2021 SLP Diet Recommendations Dysphagia 1 (Puree) solids;Nectar thick liquid Liquid Administration via Cup;Straw  Medication Administration Crushed with puree Compensations Slow rate;Small sips/bites;Clear throat after each swallow Postural Changes Seated upright at 90 degrees;Remain semi-upright after after feeds/meals (Comment)   CHL IP OTHER RECOMMENDATIONS 04/22/2021 Recommended Consults -- Oral Care Recommendations Oral care BID Other Recommendations Order thickener from pharmacy;Prohibited food (jello, ice cream, thin soups);Remove water pitcher   CHL IP FOLLOW UP RECOMMENDATIONS 04/22/2021 Follow up [facility]ations [facility] D[facility]21 Speech Therapy Frequency (ACUTE ONLY) min 2x/week Treatment Duration 2 weeks  CHL IP ORAL PHASE 04/22/2021 Oral Phase WFL Oral - Pudding Teaspoon -- Oral - Pudding Cup -- Oral - Honey Teaspoon -- Oral - Honey Cup -- Oral - Nectar Teaspoon -- Oral - Nectar Cup -- Oral - Nectar Straw -- Oral - Thin Teaspoon -- Oral - Thin Cup -- Oral - Thin Straw -- Oral - Puree -- Oral - Mech Soft -- Oral - Regular -- Oral - Multi-Consistency -- Oral - Pill -- Oral Phase - Comment --  CHL IP PHARYNGEAL PHASE 04/22/2021 Pharyngeal Phase Impaired Pharyngeal- Pudding Teaspoon -- Pharyngeal -- Pharyngeal- Pudding Cup -- Pharyngeal -- Pharyngeal- Honey Teaspoon -- Pharyngeal -- Pharyngeal- Honey Cup -- Pharyngeal -- Pharyngeal- Nectar Teaspoon -- Pharyngeal -- Pharyngeal- Nectar Cup Reduced epiglottic inversion;Pharyngeal residue - pyriform;Penetration/Aspiration during swallow Pharyngeal Material enters airway, remains ABOVE vocal cords then ejected out Pharyngeal- Nectar Straw Reduced epiglottic inversion;Pharyngeal residue - pyriform;Penetration/Aspiration during swallow Pharyngeal Material enters airway, remains ABOVE vocal cords then ejected out Pharyngeal- Thin Teaspoon -- Pharyngeal -- Pharyngeal- Thin Cup Reduced epiglottic inversion;Pharyngeal residue - pyriform;Penetration/Aspiration during swallow Pharyngeal Material enters airway, remains ABOVE vocal cords and  not ejected out Pharyngeal- Thin Straw Reduced epiglottic inversion;Pharyngeal residue - pyriform;Penetration/Aspiration during swallow;Penetration/Apiration after swallow Pharyngeal Material enters airway, passes BELOW cords and not ejected out despite cough attempt by patient Pharyngeal- Puree Reduced epiglottic inversion;Pharyngeal residue - pyriform;Pharyngeal residue - valleculae;Compensatory strategies attempted (with notebox) Pharyngeal -- Pharyngeal- Mechanical Soft -- Pharyngeal -- Pharyngeal- Regular -- Pharyngeal -- Pharyngeal- Multi-consistency -- Pharyngeal -- Pharyngeal- Pill -- Pharyngeal -- Pharyngeal Comment --  CHL IP CERVICAL ESOPHAGEAL PHASE 04/22/2021 Cervical Esophageal Phase Impaired Pudding Teaspoon -- Pudding Cup -- Honey Teaspoon -- Honey Cup -- Nectar Teaspoon -- Nectar Cup Reduced cricopharyngeal relaxation;Prominent cricopharyngeal segment Nectar Straw Reduced cricopharyngeal relaxation;Prominent cricopharyngeal segment Thin Teaspoon -- Thin Cup Reduced cricopharyngeal relaxation;Prominent cricopharyngeal segment Thin Straw Reduced cricopharyngeal relaxation;Prominent cricopharyngeal segment Puree Reduced cricopharyngeal relaxation;Prominent cricopharyngeal segment Mechanical Soft -- Regular -- Multi-consistency -- Pill -- Cervical Esophageal Comment -- Mahala Menghini., M.A. CCC-SLP Acute Rehabilitation Services Pager [phone] Office [phone] 04/22/2021, 3:50 PM              ECHOCARDIOGRAM COMPLETE  Result Date: 04/22/2021    ECHOCARDIOGRAM REPORT   Patient Name:   Anna Ewing Date of Exam: 04/22/2021 Medical Rec #:  [id number]      Height:       62.0 in Accession #:    [id number]     Weight:       106.7 lb Date of Birth:  1929/08/24       BSA:          1.464 m Patient Age:    89 years       BP:           140/73 mmHg Patient Gender: F              HR:           86 bpm. Exam Location:  Inpatient Procedure: 2D Echo, Cardiac Doppler and Color Doppler Indications:    CHF-Acute Diastolic  I50.31  History:        Patient has prior history of Echocardiogram examinations, most                 recent 03/10/2018. CHF, Signs/Symptoms:Murmur; Risk                 Factors:Hypertension, Dyslipidemia and Former Smoker.  Sonographer:    Celesta Gentile RCS Referring Phys: [id number]  MOHAMMAD L GARBA IMPRESSIONS  1. Diastolic annulus velocities indicate normal diastolic function, especially when adjusted for age. Left ventricular ejection fraction, by estimation, is 65 to 70%. The left ventricle has normal function. The left ventricle demonstrates regional wall motion abnormalities (see scoring diagram/findings for description). Left ventricular diastolic parameters were normal.  2. Right ventricular systolic function is normal. The right ventricular size is normal. There is mildly elevated pulmonary artery systolic pressure.  3. The mitral valve is degenerative. Moderate mitral valve regurgitation.  4. Tricuspid valve regurgitation is mild to moderate.  5. The aortic valve is tricuspid. There is severe calcifcation of the aortic valve. There is severe thickening of the aortic valve. Aortic valve regurgitation is not visualized. Moderate aortic valve stenosis. Comparison(s): Prior images unable to be directly viewed, comparison made by report only. The left ventricular wall motion abnormality is is new. Aortic stenosis is unchanged. FINDINGS  Left Ventricle: Diastolic annulus velocities indicate normal diastolic function, especially when adjusted for age. Left ventricular ejection fraction, by estimation, is 65 to 70%. The left ventricle has normal function. The left ventricle demonstrates regional wall motion abnormalities. Severe hypokinesis of the left ventricular, basal inferior wall and inferolateral wall. The left ventricular internal cavity size was normal in size. There is no left ventricular hypertrophy. Left ventricular diastolic  parameters were normal. Normal left ventricular filling pressure. Right  Ventricle: The right ventricular size is normal. No increase in right ventricular wall thickness. Right ventricular systolic function is normal. There is mildly elevated pulmonary artery systolic pressure. The tricuspid regurgitant velocity is 2.88  m/s, and with an assumed right atrial pressure of 3 mmHg, the estimated right ventricular systolic pressure is 36.2 mmHg. Left Atrium: Left atrial size was normal in size. Right Atrium: Right atrial size was normal in size. Prominent Eustachian valve. Pericardium: There is no evidence of pericardial effusion. Mitral Valve: The mitral valve is degenerative in appearance. There is mild thickening of the mitral valve leaflet(s). Mild mitral annular calcification. Moderate mitral valve regurgitation, with centrally-directed jet. Tricuspid Valve: The tricuspid valve is normal in structure. Tricuspid valve regurgitation is mild to moderate. Aortic Valve: The aortic valve is tricuspid. There is severe calcifcation of the aortic valve. There is severe thickening of the aortic valve. Aortic valve regurgitation is not visualized. Moderate aortic stenosis is present. Aortic valve mean gradient measures 21.7 mmHg. Aortic valve peak gradient measures 38.7 mmHg. Aortic valve area, by VTI measures 1.29 cm. Pulmonic Valve: The pulmonic valve was grossly normal. Pulmonic valve regurgitation is not visualized. Aorta: The aortic root is normal in size and structure. IAS/Shunts: No atrial level shunt detected by color flow Doppler.  LEFT VENTRICLE PLAX 2D LVIDd:         4.00 cm  Diastology LVIDs:         2.20 cm  LV e' medial:    5.44 cm/s LV PW:         0.90 cm  LV E/e' medial:  11.6 LV IVS:        1.05 cm  LV e' lateral:   12.60 cm/s LVOT diam:     1.94 cm  LV E/e' lateral: 5.0 LV SV:         78 LV SV Index:   53 LVOT Area:     2.96 cm  RIGHT VENTRICLE RV S prime:     12.60 cm/s TAPSE (M-mode): 2.1 cm LEFT ATRIUM             Index  RIGHT ATRIUM           Index LA diam:        3.10  cm 2.12 cm/m  RA Area:     16.50 cm LA Vol (A2C):   35.3 ml 24.12 ml/m RA Volume:   42.70 ml  29.17 ml/m LA Vol (A4C):   44.0 ml 30.06 ml/m LA Biplane Vol: 43.0 ml 29.38 ml/m  AORTIC VALVE AV Area (Vmax):    1.11 cm AV Area (Vmean):   1.17 cm AV Area (VTI):     1.29 cm AV Vmax:           311.00 cm/s AV Vmean:          215.333 cm/s AV VTI:            0.603 m AV Peak Grad:      38.7 mmHg AV Mean Grad:      21.7 mmHg LVOT Vmax:         117.00 cm/s LVOT Vmean:        85.100 cm/s LVOT VTI:          0.263 m LVOT/AV VTI ratio: 0.44  AORTA Ao Root diam: 3.30 cm MITRAL VALVE                TRICUSPID VALVE MV Area (PHT): 2.66 cm     TR Peak grad:   33.2 mmHg MV Decel Time: 285 msec     TR Vmax:        288.00 cm/s MR Peak grad: 141.1 mmHg MR Mean grad: 97.0 mmHg     SHUNTS MR Vmax:      594.00 cm/s   Systemic VTI:  0.26 m MR Vmean:     475.0 cm/s    Systemic Diam: 1.94 cm MV E velocity: 63.10 cm/s MV A velocity: 115.00 cm/s MV E/A ratio:  0.55 Mihai Croitoru MD Electronically signed by Thurmon Fair MD Signature Date/Time: 04/22/2021/3:13:43 PM    Final       Scheduled Meds: . enoxaparin (LOVENOX) injection  40 mg Subcutaneous Q24H  . furosemide  40 mg Intravenous BID  . metoprolol tartrate  12.5 mg Oral BID  . rosuvastatin  5 mg Oral Daily  . sodium chloride flush  3 mL Intravenous Q12H  . Vitamin D (Ergocalciferol)  50,000 Units Oral Weekly   Continuous Infusions: . sodium chloride       LOS: 3 days      Time spent: 25 minutes   Noralee Stain, DO [facility] 04/23/2021, 9:38 AM   Available via Epic secure chat 7am-7pm After these hours, please refer to coverage provider listed on [email or web address]

## 2021-04-24 DIAGNOSIS — I509 Heart failure, unspecified: Secondary | ICD-10-CM

## 2021-04-24 DIAGNOSIS — E440 Moderate protein-calorie malnutrition: Secondary | ICD-10-CM | POA: Insufficient documentation

## 2021-04-24 LAB — CBC
HCT: 27.2 % — ABNORMAL LOW (ref 36.0–46.0)
Hemoglobin: 9.1 g/dL — ABNORMAL LOW (ref 12.0–15.0)
MCH: 28.6 pg (ref 26.0–34.0)
MCHC: 33.5 g/dL (ref 30.0–36.0)
MCV: 85.5 fL (ref 80.0–100.0)
Platelets: 431 10*3/uL — ABNORMAL HIGH (ref 150–400)
RBC: 3.18 MIL/uL — ABNORMAL LOW (ref 3.87–5.11)
RDW: 14.3 % (ref 11.5–15.5)
WBC: 10.5 10*3/uL (ref 4.0–10.5)
nRBC: 0 % (ref 0.0–0.2)

## 2021-04-24 LAB — BASIC METABOLIC PANEL
Anion gap: 9 (ref 5–15)
BUN: 19 mg/dL (ref 8–23)
CO2: 25 mmol/L (ref 22–32)
Calcium: 8.4 mg/dL — ABNORMAL LOW (ref 8.9–10.3)
Chloride: 93 mmol/L — ABNORMAL LOW (ref 98–111)
Creatinine, Ser: 1.16 mg/dL — ABNORMAL HIGH (ref 0.44–1.00)
GFR, Estimated: 45 mL/min — ABNORMAL LOW (ref >60)
Glucose, Bld: 105 mg/dL — ABNORMAL HIGH (ref 70–99)
Potassium: 4.1 mmol/L (ref 3.5–5.1)
Sodium: 127 mmol/L — ABNORMAL LOW (ref 135–145)

## 2021-04-24 NOTE — TOC Progression Note (Addendum)
Transition of Care [facility]) - Progression Note    Patient Details  Name: Anna Ewing MRN: [id number] Date of Birth: May 25, 1929  [facility] CM/SW Contact  Ivette Loyal, [place] Phone Number: 04/24/2021, 3:26 PM  Clinical Narrative:    1345: CSW spoke with pt daughter about SNF placement, she suggested [facility]. CSW contacted [facility], they are able to accept pt. CSW will start auth.  Ref# [id number], clinicals faxed.        Expected Discharge Plan and Services                                                 Social Determinants of Health (SDOH) Interventions    Readmission Risk Interventions No flowsheet data found.

## 2021-04-24 NOTE — Progress Notes (Signed)
Physical Therapy Treatment Patient Details Name: Anna Ewing MRN: [id number] DOB: March 02, 1929 Today's Date: 04/24/2021    History of Present Illness Pt is a 87 y/o female referred to the ER by her PCP for lower extremity edema and SOB. Pt admitted on 04/20/2021 with acute CHF. PMH significant of hypertension, hyperlipidemia, chronic rhinitis hyponatremia, chronic diastolic CHF who was recently hospitalized at [facility] for hyponatremia CHF and discharged on May 31. Chest x-ray showed interval increase in bibasilar atelectasis and mild fluid overload.    PT Comments    Patient progressing slowly towards PT goals. Requires Mod A for bed mobility and standing transfers with cues for hand placement and repetition for cues. Pt is HOH. Improved ambulation distance with Min guard assist and use of RW for support. Sp02 >94% on RA, HR up to 116 bpm during activity. Productive cough throughout session which increased with movement. Continues to demonstrate generalized weakness and impaired balance putting pt at increased risk for falls. Continues to be appropriate for SNF. Will follow.   Follow Up Recommendations  SNF;Supervision for mobility/OOB     Equipment Recommendations  Rolling walker with 5" wheels;3in1 (PT)    Recommendations for Other Services       Precautions / Restrictions Precautions Precautions: Fall;Other (comment) Precaution Comments: Pt incontinent bowel and bladder Restrictions Weight Bearing Restrictions: No    Mobility  Bed Mobility Overal bed mobility: Needs Assistance Bed Mobility: Supine to Sit     Supine to sit: Mod assist;HOB elevated     General bed mobility comments: Mod A to advance B LE to EOB and scoot hips forward, increased time.    Transfers Overall transfer level: Needs assistance Equipment used: Rolling walker (2 wheeled) Transfers: Sit to/from Stand Sit to Stand: Mod assist;Min assist         General transfer comment: MIn-mod A to  stand from EOB with cues for hand placement; incontinent in bed prior to arrival. Flexed trunk. Transferred to chair post ambulation.  Ambulation/Gait Ambulation/Gait assistance: Min guard Gait Distance (Feet): 24 Feet Assistive device: Rolling walker (2 wheeled) Gait Pattern/deviations: Step-through pattern;Decreased stride length;Trunk flexed Gait velocity: decreased Gait velocity interpretation: <1.31 ft/sec, indicative of household ambulator General Gait Details: Slow, unsteady gait with flexed trunk and RW too far anterior; cues for RW proximity and assist with turns. VSS on RA, Sp02 >94% and HR up to 116 bpm with 2 PVCs noted with activity.   Stairs             Wheelchair Mobility    Modified Rankin (Stroke Patients Only)       Balance Overall balance assessment: Needs assistance Sitting-balance support: Feet supported;No upper extremity supported Sitting balance-Leahy Scale: Fair     Standing balance support: During functional activity Standing balance-Leahy Scale: Poor Standing balance comment: Pt reliant on UE support to maintain standing balance and close min guard                            Cognition Arousal/Alertness: Awake/alert Behavior During Therapy: WFL for tasks assessed/performed Overall Cognitive Status: No family/caregiver present to determine baseline cognitive functioning                                 General Comments: "I know where I am, I just can't say it." Able to state hospital when given 2 options. Requires repetition to perform tasks. IMpaired  STM, "so what do you need me to do?" Reads lips well as pt HOH.      Exercises      General Comments General comments (skin integrity, edema, etc.): Sp02 >94% on RA, HR up to 116 bpm during activity. Productive cough throughout session increased with movement,.      Pertinent Vitals/Pain Pain Assessment: Faces Faces Pain Scale: No hurt    Home Living                       Prior Function            PT Goals (current goals can now be found in the care plan section) Progress towards PT goals: Progressing toward goals    Frequency    Min 2X/week      PT Plan Current plan remains appropriate    Co-evaluation              AM-PAC PT "6 Clicks" Mobility   Outcome Measure  Help needed turning from your back to your side while in a flat bed without using bedrails?: A Little Help needed moving from lying on your back to sitting on the side of a flat bed without using bedrails?: A Lot Help needed moving to and from a bed to a chair (including a wheelchair)?: A Little Help needed standing up from a chair using your arms (e.g., wheelchair or bedside chair)?: A Lot Help needed to walk in hospital room?: A Little Help needed climbing 3-5 steps with a railing? : A Lot 6 Click Score: 15    End of Session Equipment Utilized During Treatment: Gait belt Activity Tolerance: Patient tolerated treatment well Patient left: in chair;with call bell/phone within reach;with chair alarm set Nurse Communication: Mobility status PT Visit Diagnosis: Other abnormalities of gait and mobility (R26.89);Muscle weakness (generalized) (M62.81);Difficulty in walking, not elsewhere classified (R26.2)     Time: 7322-0254 PT Time Calculation (min) (ACUTE ONLY): 20 min  Charges:  $Therapeutic Activity: 8-22 mins                     Vale Haven, PT, DPT Acute Rehabilitation Services Pager [phone] Office [phone]       Anna Ewing 04/24/2021, 9:33 AM

## 2021-04-24 NOTE — Care Management Important Message (Signed)
Important Message  Patient Details  Name: Anna Ewing MRN: [id number] Date of Birth: 11/12/1929   Medicare Important Message Given:  Yes     Renie Ora 04/24/2021, 8:22 AM

## 2021-04-24 NOTE — Plan of Care (Signed)
  Problem: Clinical Measurements: Goal: Respiratory complications will improve Outcome: Progressing Goal: Cardiovascular complication will be avoided Outcome: Progressing   

## 2021-04-24 NOTE — Progress Notes (Signed)
Heart Failure Navigator Progress Note  Assessed for Heart & Vascular TOC clinic readiness.  Unfortunately at this time the patient does not meet criteria due to underlying valvular disease contributing to symptoms. Not a candidate for invasive procedures per cardiology.    Navigator available for reassessment of patient.   Sharen Hones, PharmD, BCPS Heart Failure Stewardship Pharmacist Phone [phone]

## 2021-04-24 NOTE — Progress Notes (Addendum)
Progress Note  Patient Name: Anna Ewing Date of Encounter: 04/24/2021  [facility] Cardiologist: Kristeen Miss, MD   Subjective   Sitting in bedside chair. No complaints of chest pain, SOB, or palpitations.   Inpatient Medications    Scheduled Meds: . enoxaparin (LOVENOX) injection  30 mg Subcutaneous Q24H  . feeding supplement (NEPRO CARB STEADY)  237 mL Oral BID BM  . metoprolol tartrate  12.5 mg Oral BID  . multivitamin with minerals  1 tablet Oral Daily  . potassium chloride  20 mEq Oral BID  . rosuvastatin  5 mg Oral Daily  . sodium chloride flush  3 mL Intravenous Q12H  . Vitamin D (Ergocalciferol)  50,000 Units Oral Weekly   Continuous Infusions: . sodium chloride     PRN Meds: sodium chloride, acetaminophen, food thickener, ondansetron (ZOFRAN) IV, sodium chloride flush   Vital Signs    Vitals:   04/24/21 0445 04/24/21 0453 04/24/21 0928 04/24/21 1109  BP: (!) 148/72  132/68 132/68  Pulse: 85  92 92  Resp: Temp: 97.9 F (36.6 C)  98 F (36.7 C) 98 F (36.7 C)  TempSrc: Oral  Oral Oral  SpO2: 94%  94% 94%  Weight:  46.4 kg    Height:        Intake/Output Summary (Last 24 hours) at 04/24/2021 1144 Last data filed at 04/24/2021 0928 Gross per 24 hour  Intake 300 ml  Output 1450 ml  Net -1150 ml   Last 3 Weights 04/24/2021 04/23/2021 04/23/2021  Weight (lbs) 102 lb 4.8 oz 103 lb 1.6 oz 104 lb 4.4 oz  Weight (kg) 46.403 kg 46.766 kg 47.3 kg      Telemetry    Sinus rhythm/sinus tachycardia with occasional PACs - Personally Reviewed  ECG    No new tracings - Personally Reviewed  Physical Exam   GEN: Sitting in bedside chair in no acute distress.   Neck: No JVD Cardiac: RRR, +murmur, no rubs or gallops.  Respiratory: poor inspiratory effort though overall clear to auscultation bilaterally GI: Soft, nontender, non-distended  MS: trace LE edema R>L; No deformity. Neuro:  Nonfocal; HOH Psych: Normal affect   Labs    High  Sensitivity Troponin:   Recent Labs  Lab 04/20/21 1600 04/20/21 1807  TROPONINIHS 622* 596*      Chemistry Recent Labs  Lab 04/20/21 1600 04/20/21 2037 04/22/21 0406 04/23/21 0846 04/24/21 0404  NA 130*   < > 126* 127* 127*  K 3.2*   < > 3.7 3.2* 4.1  CL 99   < > 92* 90* 93*  CO2 23   < > GLUCOSE 119*   < > 109* 130* 105*  BUN 14   < > CREATININE 0.83   < > 1.00 1.30* 1.16*  CALCIUM 8.0*   < > 8.0* 8.2* 8.4*  PROT 5.9*  --   --   --   --   ALBUMIN 2.1*  --   --   --   --   AST 32  --   --   --   --   ALT 22  --   --   --   --   ALKPHOS 51  --   --   --   --   BILITOT 0.6  --   --   --   --   GFRNONAA >60   < > 54* 39* 45*  ANIONGAP 8   < > 10 12 9    < > = values in this interval not displayed.     Hematology Recent Labs  Lab 04/22/21 0406 04/23/21 0846 04/24/21 0404  WBC 12.1* 11.1* 10.5  RBC 2.92* 3.00* 3.18*  HGB 8.3* 8.6* 9.1*  HCT 24.7* 25.8* 27.2*  MCV 84.6 86.0 85.5  MCH 28.4 28.7 28.6  MCHC 33.6 33.3 33.5  RDW 14.2 14.3 14.3  PLT 399 480* 431*    BNP Recent Labs  Lab 04/20/21 1600  BNP 936.1*     DDimer No results for input(s): DDIMER in the last 168 hours.   Radiology    DG Swallowing Func-Speech Pathology  Result Date: 04/22/2021 Objective Swallowing Evaluation: Type of Study: MBS-Modified Barium Swallow Study  Patient Details Name: Anna Ewing MRN: Leamon Arnt Date of Birth: [birth year] Today's Date: 04/22/2021 Time: SLP Start Time (ACUTE ONLY): 1436 -SLP Stop Time (ACUTE ONLY): 1451 SLP Time Calculation (min) (ACUTE ONLY): 15 min Past Medical History: Past Medical History: Diagnosis Date . Chronic rhinitis  . Hyperlipidemia  . Hypertension  Past Surgical History: Past Surgical History: Procedure Laterality Date . INTRAMEDULLARY (IM) NAIL INTERTROCHANTERIC Left 03/12/2020  Procedure: INTRAMEDULLARY (IM) NAIL INTERTROCHANTRIC;  Surgeon: 03/14/2020, MD;  Location: [facility] OR;  Service: Orthopedics;  Laterality: Left; [facility] MANDIBLE  SURGERY    cosmetic . NASAL SINUS SURGERY   . ORIF HUMERUS FRACTURE Left 03/12/2020  Procedure: OPEN REDUCTION INTERNAL FIXATION (ORIF) PROXIMAL HUMERUS FRACTURE;  Surgeon: 03/14/2020, MD;  Location: [facility] OR;  Service: Orthopedics;  Laterality: Left; [facility] VESICOVAGINAL FISTULA CLOSURE W/ TAH  2005 HPI: Anna Ewing is a 87 y.o. female with medical history significant of hypertension, hyperlipidemia, chronic rhinitis hyponatremia, chronic diastolic CHF.  Pt was admitted with acute CHF.  CXR on 04/20/21 reported "Interval increase in bibasilar atelectasis and effusions which may be due to hypoventilation or mild fluid overload."  Subjective: Pt was pleasant and cooperative Assessment / Plan / Recommendation CHL IP CLINICAL IMPRESSIONS 04/22/2021 Clinical Impression Pt has a moderate pharyngoesophageal dysphagia with limited entrance into the UES. Despite other adequate components of her pharyngeal swallow, she does not achieve full epiglottic inversion as her epiglottis seems to be impeded as it hits her posterior pharyngeal swallow. She has a tight UES with prominent/fullness to CP that significantly reduces UES in terms of opening and duration for opening. Residue remains in the pharynx, increasing in volume as consistencies become thicker. Throat clearing throughout the study was either associated with pharyngeal residue, which pt also tried to spontaneously clear with repeated swallows, or with trace penetration that occurs mostly during the swallow. Throat clearing is largely protective of the airway, and a cue to clear her throat harder does eject any penetrates that she doesn't clear on her own. However, pt did begin to aspirate thin liquids once her pharynx was more filled with residue from purees. Although not cleared, this was at least sensed with an attempt at coughing. Recommend Dys 1 diet and nectar thick liquids for now with use of intermittent throat clearing. MD may wish to further assess anatomy around  the level of the UES given impact on swallowing function.  SLP Visit Diagnosis Dysphagia, pharyngoesophageal phase (R13.14) Attention and concentration deficit following -- Frontal lobe and executive function deficit following -- Impact on safety and function Moderate aspiration risk;Risk for inadequate nutrition/hydration   CHL IP TREATMENT RECOMMENDATION 04/22/2021 Treatment Recommendations Therapy as outlined in treatment plan below   Prognosis 04/22/2021  Prognosis for Safe Diet Advancement Fair Barriers to Reach Goals Other (Comment) Barriers/Prognosis Comment -- CHL IP DIET RECOMMENDATION 04/22/2021 SLP Diet Recommendations Dysphagia 1 (Puree) solids;Nectar thick liquid Liquid Administration via Cup;Straw Medication Administration Crushed with puree Compensations Slow rate;Small sips/bites;Clear throat after each swallow Postural Changes Seated upright at 90 degrees;Remain semi-upright after after feeds/meals (Comment)   CHL IP OTHER RECOMMENDATIONS 04/22/2021 Recommended Consults -- Oral Care Recommendations Oral care BID Other Recommendations Order thickener from pharmacy;Prohibited food (jello, ice cream, thin soups);Remove water pitcher   CHL IP FOLLOW UP RECOMMENDATIONS 04/22/2021 Follow up Recommendations Skilled Nursing facility   [facility] IP FREQUENCY AND DURATION 04/22/2021 Speech Therapy Frequency (ACUTE ONLY) min 2x/week Treatment Duration 2 weeks      CHL IP ORAL PHASE 04/22/2021 Oral Phase WFL Oral - Pudding Teaspoon -- Oral - Pudding Cup -- Oral - Honey Teaspoon -- Oral - Honey Cup -- Oral - Nectar Teaspoon -- Oral - Nectar Cup -- Oral - Nectar Straw -- Oral - Thin Teaspoon -- Oral - Thin Cup -- Oral - Thin Straw -- Oral - Puree -- Oral - Mech Soft -- Oral - Regular -- Oral - Multi-Consistency -- Oral - Pill -- Oral Phase - Comment --  CHL IP PHARYNGEAL PHASE 04/22/2021 Pharyngeal Phase Impaired Pharyngeal- Pudding Teaspoon -- Pharyngeal -- Pharyngeal- Pudding Cup -- Pharyngeal -- Pharyngeal- Honey Teaspoon --  Pharyngeal -- Pharyngeal- Honey Cup -- Pharyngeal -- Pharyngeal- Nectar Teaspoon -- Pharyngeal -- Pharyngeal- Nectar Cup Reduced epiglottic inversion;Pharyngeal residue - pyriform;Penetration/Aspiration during swallow Pharyngeal Material enters airway, remains ABOVE vocal cords then ejected out Pharyngeal- Nectar Straw Reduced epiglottic inversion;Pharyngeal residue - pyriform;Penetration/Aspiration during swallow Pharyngeal Material enters airway, remains ABOVE vocal cords then ejected out Pharyngeal- Thin Teaspoon -- Pharyngeal -- Pharyngeal- Thin Cup Reduced epiglottic inversion;Pharyngeal residue - pyriform;Penetration/Aspiration during swallow Pharyngeal Material enters airway, remains ABOVE vocal cords and not ejected out Pharyngeal- Thin Straw Reduced epiglottic inversion;Pharyngeal residue - pyriform;Penetration/Aspiration during swallow;Penetration/Apiration after swallow Pharyngeal Material enters airway, passes BELOW cords and not ejected out despite cough attempt by patient Pharyngeal- Puree Reduced epiglottic inversion;Pharyngeal residue - pyriform;Pharyngeal residue - valleculae;Compensatory strategies attempted (with notebox) Pharyngeal -- Pharyngeal- Mechanical Soft -- Pharyngeal -- Pharyngeal- Regular -- Pharyngeal -- Pharyngeal- Multi-consistency -- Pharyngeal -- Pharyngeal- Pill -- Pharyngeal -- Pharyngeal Comment --  CHL IP CERVICAL ESOPHAGEAL PHASE 04/22/2021 Cervical Esophageal Phase Impaired Pudding Teaspoon -- Pudding Cup -- Honey Teaspoon -- Honey Cup -- Nectar Teaspoon -- Nectar Cup Reduced cricopharyngeal relaxation;Prominent cricopharyngeal segment Nectar Straw Reduced cricopharyngeal relaxation;Prominent cricopharyngeal segment Thin Teaspoon -- Thin Cup Reduced cricopharyngeal relaxation;Prominent cricopharyngeal segment Thin Straw Reduced cricopharyngeal relaxation;Prominent cricopharyngeal segment Puree Reduced cricopharyngeal relaxation;Prominent cricopharyngeal segment Mechanical Soft  -- Regular -- Multi-consistency -- Pill -- Cervical Esophageal Comment -- Mahala Menghini., M.A. CCC-SLP Acute Rehabilitation Services Pager [phone] Office [phone] 04/22/2021, 3:50 PM              ECHOCARDIOGRAM COMPLETE  Result Date: 04/22/2021    ECHOCARDIOGRAM REPORT   Patient Name:   Anna Ewing Date of Exam: 04/22/2021 Medical Rec #:  [id number]      Height:       62.0 in Accession #:    [id number]     Weight:       106.7 lb Date of Birth:  1929-01-06       BSA:          1.464 m Patient Age:    89 years       BP:  140/73 mmHg Patient Gender: F              HR:           86 bpm. Exam Location:  Inpatient Procedure: 2D Echo, Cardiac Doppler and Color Doppler Indications:    CHF-Acute Diastolic I50.31  History:        Patient has prior history of Echocardiogram examinations, most                 recent 03/10/2018. CHF, Signs/Symptoms:Murmur; Risk                 Factors:Hypertension, Dyslipidemia and Former Smoker.  Sonographer:    Celesta Gentile RCS Referring Phys: [phone] MOHAMMAD L GARBA IMPRESSIONS  1. Diastolic annulus velocities indicate normal diastolic function, especially when adjusted for age. Left ventricular ejection fraction, by estimation, is 65 to 70%. The left ventricle has normal function. The left ventricle demonstrates regional wall motion abnormalities (see scoring diagram/findings for description). Left ventricular diastolic parameters were normal.  2. Right ventricular systolic function is normal. The right ventricular size is normal. There is mildly elevated pulmonary artery systolic pressure.  3. The mitral valve is degenerative. Moderate mitral valve regurgitation.  4. Tricuspid valve regurgitation is mild to moderate.  5. The aortic valve is tricuspid. There is severe calcifcation of the aortic valve. There is severe thickening of the aortic valve. Aortic valve regurgitation is not visualized. Moderate aortic valve stenosis. Comparison(s): Prior images unable to be directly  viewed, comparison made by report only. The left ventricular wall motion abnormality is is new. Aortic stenosis is unchanged. FINDINGS  Left Ventricle: Diastolic annulus velocities indicate normal diastolic function, especially when adjusted for age. Left ventricular ejection fraction, by estimation, is 65 to 70%. The left ventricle has normal function. The left ventricle demonstrates regional wall motion abnormalities. Severe hypokinesis of the left ventricular, basal inferior wall and inferolateral wall. The left ventricular internal cavity size was normal in size. There is no left ventricular hypertrophy. Left ventricular diastolic  parameters were normal. Normal left ventricular filling pressure. Right Ventricle: The right ventricular size is normal. No increase in right ventricular wall thickness. Right ventricular systolic function is normal. There is mildly elevated pulmonary artery systolic pressure. The tricuspid regurgitant velocity is 2.88  m/s, and with an assumed right atrial pressure of 3 mmHg, the estimated right ventricular systolic pressure is 36.2 mmHg. Left Atrium: Left atrial size was normal in size. Right Atrium: Right atrial size was normal in size. Prominent Eustachian valve. Pericardium: There is no evidence of pericardial effusion. Mitral Valve: The mitral valve is degenerative in appearance. There is mild thickening of the mitral valve leaflet(s). Mild mitral annular calcification. Moderate mitral valve regurgitation, with centrally-directed jet. Tricuspid Valve: The tricuspid valve is normal in structure. Tricuspid valve regurgitation is mild to moderate. Aortic Valve: The aortic valve is tricuspid. There is severe calcifcation of the aortic valve. There is severe thickening of the aortic valve. Aortic valve regurgitation is not visualized. Moderate aortic stenosis is present. Aortic valve mean gradient measures 21.7 mmHg. Aortic valve peak gradient measures 38.7 mmHg. Aortic valve area,  by VTI measures 1.29 cm. Pulmonic Valve: The pulmonic valve was grossly normal. Pulmonic valve regurgitation is not visualized. Aorta: The aortic root is normal in size and structure. IAS/Shunts: No atrial level shunt detected by color flow Doppler.  LEFT VENTRICLE PLAX 2D LVIDd:         4.00 cm  Diastology LVIDs:  2.20 cm  LV e' medial:    5.44 cm/s LV PW:         0.90 cm  LV E/e' medial:  11.6 LV IVS:        1.05 cm  LV e' lateral:   12.60 cm/s LVOT diam:     1.94 cm  LV E/e' lateral: 5.0 LV SV:         78 LV SV Index:   53 LVOT Area:     2.96 cm  RIGHT VENTRICLE RV S prime:     12.60 cm/s TAPSE (M-mode): 2.1 cm LEFT ATRIUM             Index       RIGHT ATRIUM           Index LA diam:        3.10 cm 2.12 cm/m  RA Area:     16.50 cm LA Vol (A2C):   35.3 ml 24.12 ml/m RA Volume:   42.70 ml  29.17 ml/m LA Vol (A4C):   44.0 ml 30.06 ml/m LA Biplane Vol: 43.0 ml 29.38 ml/m  AORTIC VALVE AV Area (Vmax):    1.11 cm AV Area (Vmean):   1.17 cm AV Area (VTI):     1.29 cm AV Vmax:           311.00 cm/s AV Vmean:          215.333 cm/s AV VTI:            0.603 m AV Peak Grad:      38.7 mmHg AV Mean Grad:      21.7 mmHg LVOT Vmax:         117.00 cm/s LVOT Vmean:        85.100 cm/s LVOT VTI:          0.263 m LVOT/AV VTI ratio: 0.44  AORTA Ao Root diam: 3.30 cm MITRAL VALVE                TRICUSPID VALVE MV Area (PHT): 2.66 cm     TR Peak grad:   33.2 mmHg MV Decel Time: 285 msec     TR Vmax:        288.00 cm/s MR Peak grad: 141.1 mmHg MR Mean grad: 97.0 mmHg     SHUNTS MR Vmax:      594.00 cm/s   Systemic VTI:  0.26 m MR Vmean:     475.0 cm/s    Systemic Diam: 1.94 cm MV E velocity: 63.10 cm/s MV A velocity: 115.00 cm/s MV E/A ratio:  0.55 Mihai Croitoru MD Electronically signed by Thurmon Fair MD Signature Date/Time: 04/22/2021/3:13:43 PM    Final     Cardiac Studies   Echocardiogram 04/22/21: 1. Diastolic annulus velocities indicate normal diastolic function,  especially when adjusted for age. Left  ventricular ejection fraction, by  estimation, is 65 to 70%. The left ventricle has normal function. The left  ventricle demonstrates regional wall  motion abnormalities (see scoring diagram/findings for description). Left  ventricular diastolic parameters were normal.  2. Right ventricular systolic function is normal. The right ventricular  size is normal. There is mildly elevated pulmonary artery systolic  pressure.  3. The mitral valve is degenerative. Moderate mitral valve regurgitation.  4. Tricuspid valve regurgitation is mild to moderate.  5. The aortic valve is tricuspid. There is severe calcifcation of the  aortic valve. There is severe thickening of the aortic valve. Aortic valve  regurgitation is not visualized.  Moderate aortic valve stenosis.   Comparison(s): Prior images unable to be directly viewed, comparison made  by report only. The left ventricular wall motion abnormality is is new.  Aortic stenosis is unchanged.   Patient Profile     87 y.o. female with a PMH of chronic diastolic CHF, HTN, HLD, valvulopathy (moderate AS and MR) hyponatremia, mild dementia, who is being followed by cardiology for CHF.   Assessment & Plan    1. Acute on chronic diastolic CHF: patient presented with worsening SOB and LE edema following [facility] for CHF and hyponatremia. Recommended to go to SNF, however family took her home. She had 3+ LE edema and crackles on exam. BNP elevated to 936. CXR with mild fluid overload. She was diuresed with IV lasix 40mg  BID, stopped after AM dose 04/23/21 given bump in Cr. UOP is net -2.1L this admission though question accuracy given po intake of only 180mL in the past 24 hours.  Wt was 108 on admission, down to 102lbs. Echo this admission showed EF 65-70%, normal LV diastolic function, RWMA showed severe hypokinesis of the basal inferior wall and inferolateral wall, normal RV function/size, mildly elevated PA  pressures, moderate MR, mild-moderate TR, and moderate AS. - Anticipate restarting po lasix tomorrow as Cr improves - Continue BBlocker - Continue to monitor strict I&Os and daily weights - Continue to monitor electrolytes and replete as needed to maintain K >4, Mg >2  2. Elevated troponin and RWMA on echocardiogram: possible she had a silent inferior MI as she has not had any anginal complaints. HsTrop elevated to 622>596, trend not c/w ACS and more likely demand ischemia. That being said, she was felt to be a poor candidate for invasive procedures and further ischemic evaluation was not recommended.  - Continue statin - Continue BBlocker  3. Valvulopathy: moderate AS, moderate MR, and mild-moderate TR noted on echo this admission. Possible this is contributing to some of her volume overload.  - Can continue routine outpatient monitoring if in line with GOC  4. Hyponatremia: Na 127, felt to be 2/2 volume overload, was in the low 120s during admission to [facility] 03/[facility]21.  - Continue management per primary team.         For questions or updates, please contact [facility] Please consult [email or web address] for contact info under        Signed, Beatriz StallionKrista M. Kroeger, PA-C  04/24/2021, 11:44 AM    Attending Note:   The patient was seen and examined.  Agree with assessment and plan as noted above.  Changes made to the above note as needed.  Patient seen and independently examined with Judy PimpleKrista Kroeger, PA .   We discussed all aspects of the encounter. I agree with the assessment and plan as stated above.  1.   Acute on chronic CHF - she has AS and MR.  LV systolic function is normal.   Diastolic function reported as normal  Has responded well to diuretics.  Anticipate starting po lasix tomorrow   2.   Elevated troponin :   Likely due to demand ischemia .  She is a poor candidate for invasive cardiac procedures :   3.  Hyponatremia:  Plans per primary team    I have spent a total  of 40 minutes with patient reviewing hospital  notes , telemetry, EKGs, labs and examining patient as well as establishing an assessment and plan that was discussed with the patient. > 50% of time  was spent in direct patient care.    Vesta Mixer, Montez Hageman., MD, [facility] 04/24/2021, 11:53 AM [address],  [address] Office [phone] Pager [phone]

## 2021-04-24 NOTE — Progress Notes (Signed)
Attempted to give patient scheduled metoprolol crushed in applesauce. Pt took one bite and stated that, "she couldn't get it down". Pt started coughing and coughed up moderate amount of phlegm. No other oral meds were attempted to be given. Patient was sitting upright during this occurrence.   This RN kept patient upright. Patient stated that, "she was okay". Patient has not had anymore coughing episodes. Will continue to monitor

## 2021-04-24 NOTE — Progress Notes (Addendum)
PROGRESS NOTE    Anna ArntSerita L Kuchenbecker  ZOX:[id number] DOB: 02/09/1929 DOA: 04/20/2021 PCP: Margaree MackintoshBaxley, Anna J, MD     Brief Narrative:  Anna Ewing is a 87 y.o. female with medical history significant of hypertension, hyperlipidemia, chronic rhinitis hyponatremia, chronic diastolic CHF, dementia who was recently hospitalized at [facility] and discharged on May 31.  At that point she was being treated for hyponatremia and CHF.  Patient was weak and skilled nursing facility was recommended but family decided to take patient home.  She went home and had worsening lower extremity edema.  Also some shortness of breath.  Was seen and evaluated by her PCP today who sent her over to the ER.  In the ER patient was noted to have 3+ pedal edema.  Also some crackles and evidence of fluid overload.  She still has some mild hyponatremia with sodium of 130.  Per patient the legs were not as swollen prior to yesterday.  Son concurred.  The swelling started mostly within the last 24 to 48 hours.  She was admitted for CHF exacerbation.  Hospital records from [facility] was reviewed.  Patient had recent loss of her husband earlier this year, has been having poor oral intake.  She was admitted to [facility], found to have hyponatremia as low as 119.  She had paroxysmal atrial fibrillation at that hospitalization, started on beta-blocker.  She converted back to normal sinus rhythm.  She was given IV fluid, salt tabs, discharged home.  New events last 24 hours / Subjective: Sitting in a recliner this morning.  She has no physical complaints, denies any chest pain, shortness of breath.  States that she is doing well overall.   Assessment & Plan:   Principal Problem:   CHF (congestive heart failure), NYHA class I, acute on chronic, diastolic (HCC) Active Problems:   Hyperlipemia   Essential hypertension   Hearing loss   Hyponatremia   Anemia   Pressure injury of skin   Malnutrition of moderate  degree   Acute on chronic diastolic heart failure -BNP 936.1 -Chest x-ray showed interval increase in bibasilar atelectasis and effusion -Repeat echocardiogram showed EF 65 to 70%,, normal diastolic function, did reveal LV regional wall motion abnormalities -Strict I's and O's, daily weight, fluid and sodium restriction diet -Holding IV Lasix due to elevation in creatinine.  Likely will resume oral Lasix tomorrow  Chronic hyponatremia -Hypervolemic -Continue sodium restriction diet -Monitor BMP  -Her sodium levels were as low as 119-121 [facility] May   AKI -Creatinine 0.83 --> 1.3 in setting of Lasix -Improved  Hypertension -Continue metoprolol -Holding amlodipine in setting of peripheral edema   Paroxysmal A Fib -[facility]ed [facility]rol[facility] in [facility] in [facility] -NSR today   Hyperlipidemia -Continue Crestor  Demand ischemia -Troponin trend has been flat 622 > 596 -Due to findings of wall motion abnormalities on echocardiogram, cardiology was consulted.  Patient continues to deny any chest pain -Patient is not a candidate for invasive procedures, no recommendation for further ischemic evaluation at this time  Dysphagia -Continue dysphagia diet -MBS did reveal limited entrance into UES, may need outpatient work-up and follow-up  Deconditioning -PT OT recommending SNF placement   In agreement with assessment of the pressure ulcer as below:  Pressure Injury 04/20/21 Vertebral column Mid Stage 2 -  Partial thickness loss of dermis presenting as a shallow open injury with a red, pink wound bed without slough. 0.2 diameter, no depth or drainage. White in color (  Active)  04/20/21 2000  Location: Vertebral column  Location Orientation: Mid  Staging: Stage 2 -  Partial thickness loss of dermis presenting as a shallow open injury with a red, pink wound bed without slough.  Wound Description (Comments): 0.2 diameter, no depth or drainage. White in color  Present on  Admission: Yes     Nutrition Problem: Moderate Malnutrition Etiology: chronic illness (CHF)   DVT prophylaxis:  enoxaparin (LOVENOX) injection 30 mg Start: 04/23/21 2200 Place TED hose Start: 04/22/21 1012  Code Status:     Code Status Orders  (From admission, onward)         Start     Ordered   04/20/21 2029  Full code  Continuous        04/20/21 2028        Code Status History    Date Active Date Inactive Code Status Order ID Comments User Context   03/12/2020 1415 03/16/2020 2011 Full Code [id number]  Emeline General, MD ED   Advance Care Planning Activity     Family Communication: None at bedside; updated daughter over the phone  Disposition Plan:  Status is: Inpatient  Remains inpatient appropriate because:Inpatient level of care appropriate due to severity of illness   Dispo: The patient is from: Home              Anticipated d/c is to: SNF              Patient currently is not medically stable to d/c.  Likely discharge to SNF once placement found in the next day or 2.   Difficult to place patient No      Consultants:   Cardiology  Procedures:   None   Antimicrobials:  Anti-infectives (From admission, onward)   None       Objective: Vitals:   04/23/21 2148 04/24/21 0445 04/24/21 0453 04/24/21 0928  BP: 134/67 (!) 148/72  132/68  Pulse: 80 85  92  Resp:  17  18  Temp:  97.9 F (36.6 C)  98 F (36.7 C)  TempSrc:  Oral  Oral  SpO2:  94%  94%  Weight:   46.4 kg   Height:        Intake/Output Summary (Last 24 hours) at 04/24/2021 1046 Last data filed at 04/24/2021 0928 Gross per 24 hour  Intake 300 ml  Output 1450 ml  Net -1150 ml   Filed Weights   04/23/21 0230 04/23/21 0450 04/24/21 0453  Weight: 47.3 kg 46.8 kg 46.4 kg    Examination: General exam: Appears calm and comfortable  Respiratory system: Clear to auscultation. Respiratory effort normal. Cardiovascular system: S1 & S2 heard, RRR. +1 pedal edema. Gastrointestinal system:  Abdomen is nondistended, soft and nontender. Normal bowel sounds heard. Central nervous system: Alert. Non focal exam. Speech clear  Extremities: Symmetric in appearance bilaterally  Skin: No rashes, lesions or ulcers on exposed skin  Psychiatry: Stable  Data Reviewed: I have personally reviewed following labs and imaging studies  CBC: Recent Labs  Lab 04/20/21 1600 04/20/21 2037 04/22/21 0406 04/23/21 0846 04/24/21 0404  WBC 12.1* 12.5* 12.1* 11.1* 10.5  NEUTROABS 9.4*  --   --   --   --   HGB 8.4* 9.5* 8.3* 8.6* 9.1*  HCT 25.6* 27.7* 24.7* 25.8* 27.2*  MCV 87.4 85.5 84.6 86.0 85.5  PLT 410* 394 399 480* 431*   Basic Metabolic Panel: Recent Labs  Lab 04/20/21 1600 04/20/21 2037 04/21/21 0127 04/22/21 0406 04/23/21 6010  04/24/21 0404  NA 130*  --  132* 126* 127* 127*  K 3.2*  --  4.0 3.7 3.2* 4.1  CL 99  --  99 92* 90* 93*  CO2 23  --  21* 24 25 25   GLUCOSE 119*  --  105* 109* 130* 105*  BUN 14  --  12 12 20 19   CREATININE 0.83 0.88 0.87 1.00 1.30* 1.16*  CALCIUM 8.0*  --  8.1* 8.0* 8.2* 8.4*   GFR: Estimated Creatinine Clearance: 23.6 mL/min (A) (by C-G formula based on SCr of 1.16 mg/dL (H)). Liver Function Tests: Recent Labs  Lab 04/20/21 1600  AST 32  ALT 22  ALKPHOS 51  BILITOT 0.6  PROT 5.9*  ALBUMIN 2.1*   No results for input(s): LIPASE, AMYLASE in the last 168 hours. No results for input(s): AMMONIA in the last 168 hours. Coagulation Profile: No results for input(s): INR, PROTIME in the last 168 hours. Cardiac Enzymes: No results for input(s): CKTOTAL, CKMB, CKMBINDEX, TROPONINI in the last 168 hours. BNP (last 3 results) No results for input(s): PROBNP in the last 8760 hours. HbA1C: No results for input(s): HGBA1C in the last 72 hours. CBG: No results for input(s): GLUCAP in the last 168 hours. Lipid Profile: No results for input(s): CHOL, HDL, LDLCALC, TRIG, CHOLHDL, LDLDIRECT in the last 72 hours. Thyroid Function Tests: No results for  input(s): TSH, T4TOTAL, FREET4, T3FREE, THYROIDAB in the last 72 hours. Anemia Panel: No results for input(s): VITAMINB12, FOLATE, FERRITIN, TIBC, IRON, RETICCTPCT in the last 72 hours. Sepsis Labs: No results for input(s): PROCALCITON, LATICACIDVEN in the last 168 hours.  Recent Results (from the past 240 hour(s))  Resp Panel by RT-PCR (Flu A&B, Covid) Nasopharyngeal Swab     Status: None   Collection Time: 04/20/21  4:15 PM   Specimen: Nasopharyngeal Swab; Nasopharyngeal(NP) swabs in vial transport medium  Result Value Ref Range Status   SARS Coronavirus 2 by RT PCR NEGATIVE NEGATIVE Final    Comment: (NOTE) SARS-CoV-2 target nucleic acids are NOT DETECTED.  The SARS-CoV-2 RNA is generally detectable in upper respiratory specimens during the acute phase of infection. The lowest concentration of SARS-CoV-2 viral copies this assay can detect is 138 copies/mL. A negative result does not preclude SARS-Cov-2 infection and should not be used as the sole basis for treatment or other patient management decisions. A negative result may occur with  improper specimen collection/handling, submission of specimen other than nasopharyngeal swab, presence of viral mutation(s) within the areas targeted by this assay, and inadequate number of viral copies(<138 copies/mL). A negative result must be combined with clinical observations, patient history, and epidemiological information. The expected result is Negative.  Fact Sheet for Patients:  06/20/21  Fact Sheet for Healthcare Providers:  06/20/21  This test is no t yet approved or cleared by the [employer] FDA and  has been authorized for detection and/or diagnosis of SARS-CoV-2 by FDA under an Emergency Use Authorization (EUA). This EUA will remain  in effect (meaning this test can be used) for the duration of the COVID-19 declaration under Section 564(b)(1) of the Act,  21 U.S.C.section 360bbb-3(b)(1), unless the authorization is terminated  or revoked sooner.       Influenza A by PCR NEGATIVE NEGATIVE Final   Influenza B by PCR NEGATIVE NEGATIVE Final    Comment: (NOTE) The Xpert Xpress SARS-CoV-2/FLU/RSV plus assay is intended as an aid in the diagnosis of influenza from Nasopharyngeal swab specimens and should not be used as  a sole basis for treatment. Nasal washings and aspirates are unacceptable for Xpert Xpress SARS-CoV-2/FLU/RSV testing.  Fact Sheet for Patients: [employer]com  Fact Sheet for Healthcare Providers: Serious[employer]ker.it  This test is not yet approved or cleared by the [place] FDA and has been authorized for detection and/or diagnosis of SARS-CoV-2 by FDA under an Emergency Use Authorization (EUA). This EUA will remain in effect (meaning this test can be used) for the duration of the COVID-19 declaration under Section 564(b)(1) of the Act, 21 U.S.C. section 360bbb-3(b)(1), unless the authorization is terminated or revoked.  Performed at [facility], [address], [place], [place] [address]       Radiology Studies: DG Swallowing Func-Speech Pathology  Result Date: 04/22/2021 Objective Swallowing Evaluation: Type of Study: MBS-Modified Barium Swallow Study  Patient Details Name: ANNIBELLE BRAZIE MRN: [id number] Date of Birth: 12-02-1928 Today's Date: 04/22/2021 Time: SLP Start Time (ACUTE ONLY): 1436 -SLP Stop Time (ACUTE ONLY): 1451 SLP Time Calculation (min) (ACUTE ONLY): 15 min Past Medical History: Past Medical History: Diagnosis Date . Chronic rhinitis  . Hyperlipidemia  . Hypertension  Past Surgical History: Past Surgical History: Procedure Laterality Date . INTRAMEDULLARY (IM) NAIL INTERTROCHANTERIC Left 03/12/2020  Procedure: INTRAMEDULLARY (IM) NAIL INTERTROCHANTRIC;  Surgeon: Roby Lofts, MD;  Location: [facility] OR;  Service: Orthopedics;  Laterality: Left; Marland Kitchen  MANDIBLE SURGERY    cosmetic . NASAL SINUS SURGERY   . ORIF HUMERUS FRACTURE Left 03/12/2020  Procedure: OPEN REDUCTION INTERNAL FIXATION (ORIF) PROXIMAL HUMERUS FRACTURE;  Surgeon: Roby Lofts, MD;  Location: [facility] OR;  Service: Orthopedics;  Laterality: Left; Marland Kitchen VESICOVAGINAL FISTULA CLOSURE W/ TAH  2005 HPI: SABRA SESSLER is a 87 y.o. female with medical history significant of hypertension, hyperlipidemia, chronic rhinitis hyponatremia, chronic diastolic CHF.  Pt was admitted with acute CHF.  CXR on 04/20/21 reported "Interval increase in bibasilar atelectasis and effusions which may be due to hypoventilation or mild fluid overload."  Subjective: Pt was pleasant and cooperative Assessment / Plan / Recommendation CHL IP CLINICAL IMPRESSIONS 04/22/2021 Clinical Impression Pt has a moderate pharyngoesophageal dysphagia with limited entrance into the UES. Despite other adequate components of her pharyngeal swallow, she does not achieve full epiglottic inversion as her epiglottis seems to be impeded as it hits her posterior pharyngeal swallow. She has a tight UES with prominent/fullness to CP that significantly reduces UES in terms of opening and duration for opening. Residue remains in the pharynx, increasing in volume as consistencies become thicker. Throat clearing throughout the study was either associated with pharyngeal residue, which pt also tried to spontaneously clear with repeated swallows, or with trace penetration that occurs mostly during the swallow. Throat clearing is largely protective of the airway, and a cue to clear her throat harder does eject any penetrates that she doesn't clear on her own. However, pt did begin to aspirate thin liquids once her pharynx was more filled with residue from purees. Although not cleared, this was at least sensed with an attempt at coughing. Recommend Dys 1 diet and nectar thick liquids for now with use of intermittent throat clearing. MD may wish to further assess anatomy  around the level of the UES given impact on swallowing function.  SLP Visit Diagnosis Dysphagia, pharyngoesophageal phase (R13.14) Attention and concentration deficit following -- Frontal lobe and executive function deficit following -- Impact on safety and function Moderate aspiration risk;Risk for inadequate nutrition/hydration   CHL IP TREATMENT RECOMMENDATION 04/22/2021 Treatment Recommendations Therapy as outlined in treatment plan  below   Prognosis 04/22/2021 Prognosis for Safe Diet Advancement Fair Barriers to Reach Goals Other (Comment) Barriers/Prognosis Comment -- CHL IP DIET RECOMMENDATION 04/22/2021 SLP Diet Recommendations Dysphagia 1 (Puree) solids;Nectar thick liquid Liquid Administration via Cup;Straw Medication Administration Crushed with puree Compensations Slow rate;Small sips/bites;Clear throat after each swallow Postural Changes Seated upright at 90 degrees;Remain semi-upright after after feeds/meals (Comment)   CHL IP OTHER RECOMMENDATIONS 04/22/2021 Recommended Consults -- Oral Care Recommendations Oral care BID Other Recommendations Order thickener from pharmacy;Prohibited food (jello, ice cream, thin soups);Remove water pitcher   CHL IP FOLLOW UP RECOMMENDATIONS 04/22/2021 Follow up Recommendations Skilled Nursing facility   [facility] IP FREQUENCY AND DURATION 04/22/2021 Speech Therapy Frequency (ACUTE ONLY) min 2x/week Treatment Duration 2 weeks      CHL IP ORAL PHASE 04/22/2021 Oral Phase WFL Oral - Pudding Teaspoon -- Oral - Pudding Cup -- Oral - Honey Teaspoon -- Oral - Honey Cup -- Oral - Nectar Teaspoon -- Oral - Nectar Cup -- Oral - Nectar Straw -- Oral - Thin Teaspoon -- Oral - Thin Cup -- Oral - Thin Straw -- Oral - Puree -- Oral - Mech Soft -- Oral - Regular -- Oral - Multi-Consistency -- Oral - Pill -- Oral Phase - Comment --  CHL IP PHARYNGEAL PHASE 04/22/2021 Pharyngeal Phase Impaired Pharyngeal- Pudding Teaspoon -- Pharyngeal -- Pharyngeal- Pudding Cup -- Pharyngeal -- Pharyngeal- Honey Teaspoon --  Pharyngeal -- Pharyngeal- Honey Cup -- Pharyngeal -- Pharyngeal- Nectar Teaspoon -- Pharyngeal -- Pharyngeal- Nectar Cup Reduced epiglottic inversion;Pharyngeal residue - pyriform;Penetration/Aspiration during swallow Pharyngeal Material enters airway, remains ABOVE vocal cords then ejected out Pharyngeal- Nectar Straw Reduced epiglottic inversion;Pharyngeal residue - pyriform;Penetration/Aspiration during swallow Pharyngeal Material enters airway, remains ABOVE vocal cords then ejected out Pharyngeal- Thin Teaspoon -- Pharyngeal -- Pharyngeal- Thin Cup Reduced epiglottic inversion;Pharyngeal residue - pyriform;Penetration/Aspiration during swallow Pharyngeal Material enters airway, remains ABOVE vocal cords and not ejected out Pharyngeal- Thin Straw Reduced epiglottic inversion;Pharyngeal residue - pyriform;Penetration/Aspiration during swallow;Penetration/Apiration after swallow Pharyngeal Material enters airway, passes BELOW cords and not ejected out despite cough attempt by patient Pharyngeal- Puree Reduced epiglottic inversion;Pharyngeal residue - pyriform;Pharyngeal residue - valleculae;Compensatory strategies attempted (with notebox) Pharyngeal -- Pharyngeal- Mechanical Soft -- Pharyngeal -- Pharyngeal- Regular -- Pharyngeal -- Pharyngeal- Multi-consistency -- Pharyngeal -- Pharyngeal- Pill -- Pharyngeal -- Pharyngeal Comment --  CHL IP CERVICAL ESOPHAGEAL PHASE 04/22/2021 Cervical Esophageal Phase Impaired Pudding Teaspoon -- Pudding Cup -- Honey Teaspoon -- Honey Cup -- Nectar Teaspoon -- Nectar Cup Reduced cricopharyngeal relaxation;Prominent cricopharyngeal segment Nectar Straw Reduced cricopharyngeal relaxation;Prominent cricopharyngeal segment Thin Teaspoon -- Thin Cup Reduced cricopharyngeal relaxation;Prominent cricopharyngeal segment Thin Straw Reduced cricopharyngeal relaxation;Prominent cricopharyngeal segment Puree Reduced cricopharyngeal relaxation;Prominent cricopharyngeal segment Mechanical Soft  -- Regular -- Multi-consistency -- Pill -- Cervical Esophageal Comment -- Mahala Menghini., M.A. CCC-SLP Acute Rehabilitation Services Pager [phone] Office [phone] 04/22/2021, 3:50 PM              ECHOCARDIOGRAM COMPLETE  Result Date: 04/22/2021    ECHOCARDIOGRAM REPORT   Patient Name:   Anna Ewing Date of Exam: 04/22/2021 Medical Rec #:  [id number]      Height:       62.0 in Accession #:    [id number]     Weight:       106.7 lb Date of Birth:  07/15/1929       BSA:          1.464 m Patient Age:    87 years  BP:           140/73 mmHg Patient Gender: F              HR:           86 bpm. Exam Location:  Inpatient Procedure: 2D Echo, Cardiac Doppler and Color Doppler Indications:    CHF-Acute Diastolic I50.31  History:        Patient has prior history of Echocardiogram examinations, most                 recent 03/10/2018. CHF, Signs/Symptoms:Murmur; Risk                 Factors:Hypertension, Dyslipidemia and Former Smoker.  Sonographer:    Celesta Gentile RCS Referring Phys: [id number] MOHAMMAD L GARBA IMPRESSIONS  1. Diastolic annulus velocities indicate normal diastolic function, especially when adjusted for age. Left ventricular ejection fraction, by estimation, is 65 to 70%. The left ventricle has normal function. The left ventricle demonstrates regional wall motion abnormalities (see scoring diagram/findings for description). Left ventricular diastolic parameters were normal.  2. Right ventricular systolic function is normal. The right ventricular size is normal. There is mildly elevated pulmonary artery systolic pressure.  3. The mitral valve is degenerative. Moderate mitral valve regurgitation.  4. Tricuspid valve regurgitation is mild to moderate.  5. The aortic valve is tricuspid. There is severe calcifcation of the aortic valve. There is severe thickening of the aortic valve. Aortic valve regurgitation is not visualized. Moderate aortic valve stenosis. Comparison(s): Prior images unable to be directly  viewed, comparison made by report only. The left ventricular wall motion abnormality is is new. Aortic stenosis is unchanged. FINDINGS  Left Ventricle: Diastolic annulus velocities indicate normal diastolic function, especially when adjusted for age. Left ventricular ejection fraction, by estimation, is 65 to 70%. The left ventricle has normal function. The left ventricle demonstrates regional wall motion abnormalities. Severe hypokinesis of the left ventricular, basal inferior wall and inferolateral wall. The left ventricular internal cavity size was normal in size. There is no left ventricular hypertrophy. Left ventricular diastolic  parameters were normal. Normal left ventricular filling pressure. Right Ventricle: The right ventricular size is normal. No increase in right ventricular wall thickness. Right ventricular systolic function is normal. There is mildly elevated pulmonary artery systolic pressure. The tricuspid regurgitant velocity is 2.88  m/s, and with an assumed right atrial pressure of 3 mmHg, the estimated right ventricular systolic pressure is 36.2 mmHg. Left Atrium: Left atrial size was normal in size. Right Atrium: Right atrial size was normal in size. Prominent Eustachian valve. Pericardium: There is no evidence of pericardial effusion. Mitral Valve: The mitral valve is degenerative in appearance. There is mild thickening of the mitral valve leaflet(s). Mild mitral annular calcification. Moderate mitral valve regurgitation, with centrally-directed jet. Tricuspid Valve: The tricuspid valve is normal in structure. Tricuspid valve regurgitation is mild to moderate. Aortic Valve: The aortic valve is tricuspid. There is severe calcifcation of the aortic valve. There is severe thickening of the aortic valve. Aortic valve regurgitation is not visualized. Moderate aortic stenosis is present. Aortic valve mean gradient measures 21.7 mmHg. Aortic valve peak gradient measures 38.7 mmHg. Aortic valve area,  by VTI measures 1.29 cm. Pulmonic Valve: The pulmonic valve was grossly normal. Pulmonic valve regurgitation is not visualized. Aorta: The aortic root is normal in size and structure. IAS/Shunts: No atrial level shunt detected by color flow Doppler.  LEFT VENTRICLE PLAX 2D LVIDd:  4.00 cm  Diastology LVIDs:         2.20 cm  LV e' medial:    5.44 cm/s LV PW:         0.90 cm  LV E/e' medial:  11.6 LV IVS:        1.05 cm  LV e' lateral:   12.60 cm/s LVOT diam:     1.94 cm  LV E/e' lateral: 5.0 LV SV:         78 LV SV Index:   53 LVOT Area:     2.96 cm  RIGHT VENTRICLE RV S prime:     12.60 cm/s TAPSE (M-mode): 2.1 cm LEFT ATRIUM             Index       RIGHT ATRIUM           Index LA diam:        3.10 cm 2.12 cm/m  RA Area:     16.50 cm LA Vol (A2C):   35.3 ml 24.12 ml/m RA Volume:   42.70 ml  29.17 ml/m LA Vol (A4C):   44.0 ml 30.06 ml/m LA Biplane Vol: 43.0 ml 29.38 ml/m  AORTIC VALVE AV Area (Vmax):    1.11 cm AV Area (Vmean):   1.17 cm AV Area (VTI):     1.29 cm AV Vmax:           311.00 cm/s AV Vmean:          215.333 cm/s AV VTI:            0.603 m AV Peak Grad:      38.7 mmHg AV Mean Grad:      21.7 mmHg LVOT Vmax:         117.00 cm/s LVOT Vmean:        85.100 cm/s LVOT VTI:          0.263 m LVOT/AV VTI ratio: 0.44  AORTA Ao Root diam: 3.30 cm MITRAL VALVE                TRICUSPID VALVE MV Area (PHT): 2.66 cm     TR Peak grad:   33.2 mmHg MV Decel Time: 285 msec     TR Vmax:        288.00 cm/s MR Peak grad: 141.1 mmHg MR Mean grad: 97.0 mmHg     SHUNTS MR Vmax:      594.00 cm/s   Systemic VTI:  0.26 m MR Vmean:     475.0 cm/s    Systemic Diam: 1.94 cm MV E velocity: 63.10 cm/s MV A velocity: 115.00 cm/s MV E/A ratio:  0.55 Mihai Croitoru MD Electronically signed by Thurmon Fair MD Signature Date/Time: 04/22/2021/3:13:43 PM    Final       Scheduled Meds: . enoxaparin (LOVENOX) injection  30 mg Subcutaneous Q24H  . feeding supplement (NEPRO CARB STEADY)  237 mL Oral BID BM  . metoprolol  tartrate  12.5 mg Oral BID  . multivitamin with minerals  1 tablet Oral Daily  . potassium chloride  20 mEq Oral BID  . rosuvastatin  5 mg Oral Daily  . sodium chloride flush  3 mL Intravenous Q12H  . Vitamin D (Ergocalciferol)  50,000 Units Oral Weekly   Continuous Infusions: . sodium chloride       LOS: 4 days      Time spent: 25 minutes   Noralee Stain, DO [facility] 04/24/2021, 10:46 AM   Available via  Epic secure chat 7am-7pm After these hours, please refer to coverage provider listed on [email or web address]

## 2021-04-25 LAB — OSMOLALITY, URINE: Osmolality, Ur: 336 mOsm/kg (ref 300–900)

## 2021-04-25 LAB — BASIC METABOLIC PANEL
Anion gap: 13 (ref 5–15)
BUN: 28 mg/dL — ABNORMAL HIGH (ref 8–23)
CO2: 24 mmol/L (ref 22–32)
Calcium: 8.5 mg/dL — ABNORMAL LOW (ref 8.9–10.3)
Chloride: 90 mmol/L — ABNORMAL LOW (ref 98–111)
Creatinine, Ser: 1.31 mg/dL — ABNORMAL HIGH (ref 0.44–1.00)
GFR, Estimated: 39 mL/min — ABNORMAL LOW (ref >60)
Glucose, Bld: 109 mg/dL — ABNORMAL HIGH (ref 70–99)
Potassium: 4.3 mmol/L (ref 3.5–5.1)
Sodium: 127 mmol/L — ABNORMAL LOW (ref 135–145)

## 2021-04-25 LAB — SODIUM, URINE, RANDOM: Sodium, Ur: 78 mmol/L

## 2021-04-25 MED ORDER — TORSEMIDE 20 MG PO TABS
20.0000 mg | ORAL_TABLET | Freq: Every day | ORAL | Status: DC
  Administered 2021-04-25: 20 mg via ORAL
  Filled 2021-04-25: qty 1

## 2021-04-25 MED ORDER — POTASSIUM CHLORIDE 20 MEQ PO PACK
20.0000 meq | PACK | Freq: Every day | ORAL | Status: DC
  Administered 2021-04-26 – 2021-05-01 (×6): 20 meq via ORAL
  Filled 2021-04-25 (×6): qty 1

## 2021-04-25 NOTE — TOC Progression Note (Signed)
Transition of Care [facility]) - Progression Note    Patient Details  Name: [name] MRN: [id number] Date of Birth: 1929/04/13  Transition of Care [facility]) CM/SW Contact  Eduard Roux, [place] Phone Number: 04/25/2021, 11:16 AM  Clinical Narrative:     Received insurance approval # [id number] reference # [id number] from 06/08-06/10  Antony Blackbird, MSW, LCSW Clinical Social Worker          Expected Discharge Plan and Services                                                 Social Determinants of Health (SDOH) Interventions    Readmission Risk Interventions No flowsheet data found.

## 2021-04-25 NOTE — Progress Notes (Signed)
PROGRESS NOTE   Anna Ewing  DGL:[id number] DOB: 1929/09/28 DOA: 04/20/2021 PCP: Anna Mackintosh, MD  Brief Narrative:  87 year old community dwelling white female HTN HLD HFpEF Prediabetes A1c 6.3 Hospitalization 03/12/2020 intertrochanteric left femoral fracture  Recent hospitalization OSH-DC 04/17/2021-recommended SNF placement however family decided to take patient home Evaluated by PCP 04/20/2021 who referred her to ER Found to have 3+ pitting edema hyponatremia sodium 130 BNP 936 troponin 622-->596 O2 sat on room air 87% improving to 98% Mild hypokalemia  Cardiology consulted secondary to echo showing severe hypokinesis inferior base-AV valve = moderate AS moderate MR not a candidate for invasive procedures h/o mild dementia and multiple comorbidities  Hospital-Problem based course  Acute decompensated diastolic heart failure Moderate aortic stenosis and mild mitral regurgitation Demand ischemia per cardiology, not ACS Poor cardiac cath candidate Continue Toprol 12.5 twice daily torsemide 20 Outpatient consideration for ARB however given mild azotemia would hold at this time Hypervolemic hyponatremia Mild rising creatinine with AKI superimposed on CKD 2 Specific gravity 1.008 on admission Argues for either hypervolemic hyponatremia or SIADH as I do not think she is eating well Will obtain urine sodium, urine osmolality Diurese as per cardiology-they have transitioned to oral torsemide 6/8 Increase free solute in diet if possible Not stable for discharge-electrolytes to be monitored 1 more day prior to discharge to skilled facility Hyperglycemia without diagnosis of DM TY 2 Not on any meds for diabetes Monitor trends only at this time discontinue aggressive monitoring if not eating ?  Risk for aspiration MBS performed this admission Outpatient consideration regarding dysphagia versus endoscopy SLP recommends dysphagia 1 nectar thick Continue supplements and thickening  agents HTN Hold amlodipine at this time monitor trends   DVT prophylaxis: Lovenox Code Status: Full Family Communication: Long discussion with son Anna Ewing [phone] at the bedside I have explained to him some of the concerns regarding the heart issues He understands that treatment is supportive and not curative Disposition:  Status is: Inpatient  Remains inpatient appropriate because:Persistent severe electrolyte disturbances and Unsafe d/c plan   Dispo: The patient is from: Home              Anticipated d/c is to: SNF              Patient currently is not medically stable to d/c.   Difficult to place patient No  Patient will require skilled placement/rolling walker 5 wheels 3 and 1     Consultants:   Cardiology-they have signed off as of 6/8  Procedures: Echo  Antimicrobials: None   Subjective: Oriented to person-thinks she is at Anna Ewing office-can tell me her son's name and daughter's name however Is not able to orient to year month No other specific complaints of pain fever ROS is limited  Objective: Vitals:   04/24/21 1109 04/24/21 1944 04/25/21 0304 04/25/21 0430  BP: 132/68 (!) 147/67  (!) 136/93  Pulse: 92 88  87  Resp: 18 20  17   Temp: 98 F (36.7 C) 99.3 F (37.4 C)  98.8 F (37.1 C)  TempSrc: Oral Oral  Oral  SpO2: 94% 92%  94%  Weight:   45.9 kg   Height:        Intake/Output Summary (Last 24 hours) at 04/25/2021 1023 Last data filed at 04/25/2021 0800 Gross per 24 hour  Intake 123 ml  Output 575 ml  Net -452 ml   Filed Weights   04/23/21 0450 04/24/21 0453 04/25/21 0304  Weight: 46.8 kg 46.4  kg 45.9 kg    Examination:  She is awake alert coherent x1 Bitemporal supraclavicular wasting S1-S2 HSM LLSE-->axilla Abdomen soft no rebound no guarding I cannot appreciate JVD because she is sitting in the chair No lower extremity edema  Data Reviewed: personally reviewed   Sodium 127 chloride 90 BUNs/creatinine 28/1.3 White  count 12.5-->10.5 Hemoglobin 9.1 platelet 431  Radiology Studies: No results found.   Scheduled Meds: . enoxaparin (LOVENOX) injection  30 mg Subcutaneous Q24H  . feeding supplement (NEPRO CARB STEADY)  237 mL Oral BID BM  . metoprolol tartrate  12.5 mg Oral BID  . multivitamin with minerals  1 tablet Oral Daily  . [START ON 04/26/2021] potassium chloride  20 mEq Oral Daily  . rosuvastatin  5 mg Oral Daily  . sodium chloride flush  3 mL Intravenous Q12H  . torsemide  20 mg Oral Daily  . Vitamin D (Ergocalciferol)  50,000 Units Oral Weekly   Continuous Infusions: . sodium chloride       LOS: 5 days   Time spent: 50  Anna Mura, MD [facility] To contact the attending provider between 7A-7P or the covering provider during after hours 7P-7A, please log into the web site [email or web address] and access using universal [place] password for that web site. If you do not have the password, please call the hospital operator.  04/25/2021, 10:23 AM

## 2021-04-25 NOTE — Progress Notes (Signed)
  Speech Language Pathology Treatment: Dysphagia  Patient Details Name: Anna Ewing MRN: [id number] DOB: 07-Oct-1929 Today's Date: 04/25/2021 Time: 8144-8185 SLP Time Calculation (min) (ACUTE ONLY): 14 min  Assessment / Plan / Recommendation Clinical Impression  Pt was seen for ongoing dysphagia tx with son and granddaughter present and assisting her with lunch upon SLP arrival. Pt was coughing as SLP arrived, but her son denies any other coughing since he had been there (and they had been slowly working on lunch for quite some time per his report). Pt had frequent throat clearing during additional trials, which on MBS was indicative of penetration but was also protective. Her throat clearing was productive at times of what appeared to be a combination of mucus mixed with pureed residuals. Given pharyngeal residue on MBS, suspect this may be how she is clearing what does not enter her UES over time. She remains afebrile. Recommend continuing with current diet and precautions. Education was provided to pt/family (pt with significant difficulty hearing, but family acknowledges their understanding). Will continue to follow acutely.    HPI HPI: Anna Ewing is a 87 y.o. female with medical history significant of hypertension, hyperlipidemia, chronic rhinitis hyponatremia, chronic diastolic CHF.  Pt was admitted with acute CHF.  CXR on 04/20/21 reported "Interval increase in bibasilar atelectasis and effusions which may be due to hypoventilation or mild fluid overload."      SLP Plan  Continue with current plan of care       Recommendations  Diet recommendations: Dysphagia 1 (puree);Nectar-thick liquid Liquids provided via: Straw;Cup Medication Administration: Crushed with puree Supervision: Patient able to self feed;Full supervision/cueing for compensatory strategies Compensations: Slow rate;Small sips/bites;Clear throat after each swallow Postural Changes and/or Swallow Maneuvers: Seated  upright 90 degrees;Upright 30-60 min after meal                Oral Care Recommendations: Oral care BID Follow up Recommendations: Skilled Nursing facility SLP Visit Diagnosis: Dysphagia, pharyngoesophageal phase (R13.14) Plan: Continue with current plan of care       GO                Mahala Menghini., M.A. CCC-SLP Acute Rehabilitation Services Pager [phone] Office [phone]  04/25/2021, 1:43 PM

## 2021-04-25 NOTE — Hospital Course (Addendum)
Dysphagia 1 (puree);Nectar-thick liquid Liquids provided via: Straw;Cup

## 2021-04-25 NOTE — Progress Notes (Signed)
Progress Note  Patient Name: Anna Ewing Date of Encounter: 04/25/2021  [facility] Cardiologist: Kristeen Miss, MD   Subjective   Sleepy this am . In bed.  NAD   Inpatient Medications    Scheduled Meds: . enoxaparin (LOVENOX) injection  30 mg Subcutaneous Q24H  . feeding supplement (NEPRO CARB STEADY)  237 mL Oral BID BM  . metoprolol tartrate  12.5 mg Oral BID  . multivitamin with minerals  1 tablet Oral Daily  . potassium chloride  20 mEq Oral BID  . rosuvastatin  5 mg Oral Daily  . sodium chloride flush  3 mL Intravenous Q12H  . Vitamin D (Ergocalciferol)  50,000 Units Oral Weekly   Continuous Infusions: . sodium chloride     PRN Meds: sodium chloride, acetaminophen, food thickener, ondansetron (ZOFRAN) IV, sodium chloride flush   Vital Signs    Vitals:   04/24/21 1109 04/24/21 1944 04/25/21 0304 04/25/21 0430  BP: 132/68 (!) 147/67  (!) 136/93  Pulse: 92 88  87  Resp: 18 20  17   Temp: 98 F (36.7 C) 99.3 F (37.4 C)  98.8 F (37.1 C)  TempSrc: Oral Oral  Oral  SpO2: 94% 92%  94%  Weight:   45.9 kg   Height:        Intake/Output Summary (Last 24 hours) at 04/25/2021 1017 Last data filed at 04/25/2021 0800 Gross per 24 hour  Intake 123 ml  Output 575 ml  Net -452 ml   Last 3 Weights 04/25/2021 04/24/2021 04/23/2021  Weight (lbs) 101 lb 3.1 oz 102 lb 4.8 oz 103 lb 1.6 oz  Weight (kg) 45.9 kg 46.403 kg 46.766 kg      Telemetry    Sinus rhythm/sinus tachycardia with occasional PACs - Personally Reviewed  ECG      Physical Exam   Physical Exam: Blood pressure (!) 136/93, pulse 87, temperature 98.8 F (37.1 C), temperature source Oral, resp. rate 17, height 5\' 2"  (1.575 m), weight 45.9 kg, SpO2 94 %.  GEN:   eldelry , frail,  somnolent this am  HEENT: Normal NECK: No JVD; No carotid bruits LYMPHATICS: No lymphadenopathy CARDIAC: RR , 2/6 systolic murmur  RESPIRATORY:  Clear to auscultation without rales, wheezing or rhonchi  ABDOMEN: Soft,  non-tender, non-distended MUSCULOSKELETAL:  No edema; No deformity  SKIN: Warm and dry NEUROLOGIC:  Alert and oriented x 3   Labs    High Sensitivity Troponin:   Recent Labs  Lab 04/20/21 1600 04/20/21 1807  TROPONINIHS 622* 596*      Chemistry Recent Labs  Lab 04/20/21 1600 04/20/21 2037 04/23/21 0846 04/24/21 0404 04/25/21 0218  NA 130*   < > 127* 127* 127*  K 3.2*   < > 3.2* 4.1 4.3  CL 99   < > 90* 93* 90*  CO2 23   < > 25 25 24   GLUCOSE 119*   < > 130* 105* 109*  BUN 14   < > 20 19 28*  CREATININE 0.83   < > 1.30* 1.16* 1.31*  CALCIUM 8.0*   < > 8.2* 8.4* 8.5*  PROT 5.9*  --   --   --   --   ALBUMIN 2.1*  --   --   --   --   AST 32  --   --   --   --   ALT 22  --   --   --   --   ALKPHOS 51  --   --   --   --  BILITOT 0.6  --   --   --   --   GFRNONAA >60   < > 39* 45* 39*  ANIONGAP 8   < > 12 9 13    < > = values in this interval not displayed.     Hematology Recent Labs  Lab 04/22/21 0406 04/23/21 0846 04/24/21 0404  WBC 12.1* 11.1* 10.5  RBC 2.92* 3.00* 3.18*  HGB 8.3* 8.6* 9.1*  HCT 24.7* 25.8* 27.2*  MCV 84.6 86.0 85.5  MCH 28.4 28.7 28.6  MCHC 33.6 33.3 33.5  RDW 14.2 14.3 14.3  PLT 399 480* 431*    BNP Recent Labs  Lab 04/20/21 1600  BNP 936.1*     DDimer No results for input(s): DDIMER in the last 168 hours.   Radiology    No results found.  Cardiac Studies   Echocardiogram 04/22/21: 1. Diastolic annulus velocities indicate normal diastolic function,  especially when adjusted for age. Left ventricular ejection fraction, by  estimation, is 65 to 70%. The left ventricle has normal function. The left  ventricle demonstrates regional wall  motion abnormalities (see scoring diagram/findings for description). Left  ventricular diastolic parameters were normal.  2. Right ventricular systolic function is normal. The right ventricular  size is normal. There is mildly elevated pulmonary artery systolic  pressure.  3. The mitral  valve is degenerative. Moderate mitral valve regurgitation.  4. Tricuspid valve regurgitation is mild to moderate.  5. The aortic valve is tricuspid. There is severe calcifcation of the  aortic valve. There is severe thickening of the aortic valve. Aortic valve  regurgitation is not visualized. Moderate aortic valve stenosis.   Comparison(s): Prior images unable to be directly viewed, comparison made  by report only. The left ventricular wall motion abnormality is is new.  Aortic stenosis is unchanged.   Patient Profile     87 y.o. female with a PMH of chronic diastolic CHF, HTN, HLD, valvulopathy (moderate AS and MR) hyponatremia, mild dementia, who is being followed by cardiology for CHF.   Assessment & Plan    1. Acute on chronic diastolic CHF: Has done well with diuresis Will change to PO torsemide 20 mg a day    2. Elevated troponin and RWMA on echocardiogram: possible she had a silent inferior MI as she has not had any anginal complaints. HsTrop elevated to 622>596, trend not c/w ACS and more likely demand ischemia. She denies any symptoms   No further ischemic evaluation indicated   3. Valvulopathy: moderate AS, moderate MR, and mild-moderate TR noted on echo this admission. Possible this is contributing to some of her volume overload.     4. Hyponatremia:  Further plans per IM .  [facility] will sign off.   Medication Recommendations:  Cont meds Other recommendations (labs, testing, etc):   Follow up as an outpatient:  With primary MD  Can see cardiology if needed.         For questions or updates, please contact [facility] Please consult [email or web address] for contact info under        Signed, 95, MD  04/25/2021, 10:17 AM    Attending Note:   The patient was seen and examined.  Agree with assessment and plan as noted above.  Changes made to the above note as needed.  Patient seen and independently examined with 06/25/2021, PA .   We  discussed all aspects of the encounter. I agree with the assessment and plan as stated above.  1.  Acute on chronic CHF - she has AS and MR.  LV systolic function is normal.   Diastolic function reported as normal  Has responded well to diuretics.  Anticipate starting po lasix tomorrow   2.   Elevated troponin :   Likely due to demand ischemia .  She is a poor candidate for invasive cardiac procedures :   3.  Hyponatremia:  Plans per primary team    I have spent a total of 40 minutes with patient reviewing hospital  notes , telemetry, EKGs, labs and examining patient as well as establishing an assessment and plan that was discussed with the patient. > 50% of time was spent in direct patient care.    Vesta Mixer, Montez Hageman., MD, [facility] 04/25/2021, 10:17 AM [address],  [address] Office [phone] Pager [phone]

## 2021-04-25 NOTE — Progress Notes (Signed)
Nutrition Follow-up  DOCUMENTATION CODES:   Non-severe (moderate) malnutrition in context of chronic illness  INTERVENTION:   -Continue Nepro Shake po BID, each supplement provides 425 kcal and 19 grams protein -Continue Magic cup TID with meals, each supplement provides 290 kcal and 9 grams of protein -Continue MVI with minerals daily -Feeding assistance with meals  NUTRITION DIAGNOSIS:   Moderate Malnutrition related to chronic illness (CHF) as evidenced by energy intake < or equal to 75% for > or equal to 1 month,mild fat depletion,mild muscle depletion,moderate muscle depletion,edema.  Ongoing  GOAL:   Patient will meet greater than or equal to 90% of their needs  Progressing   MONITOR:   PO intake,Supplement acceptance,Diet advancement,Labs,Weight trends,Skin,I & O's  REASON FOR ASSESSMENT:   Malnutrition Screening Tool    ASSESSMENT:   87 yo female admitted with CHF exacerbation. PMH includes HTN, HLD, CHF.  6/5- s/p MBSS- dysphagia 1 diet with nectar thick liquids  Reviewed I/O's: +88 ml x 24 hours and -2.1 L since admission  UOP: 275 ml x 24 hours  Pt sleeping soundly at time of visit. She did not respond to voice or touch. No family present at time of visit.   Intake has improved per chart documentation. Noted meal completions 40-60%. Pt consumed about 25% of her pureed waffle today. Per MAR, pt is also consuming Nepro supplements.   Per TOC notes, plan for SNF placement once medically stable.   Labs reviewed: Na: 127.  Diet Order:   Diet Order            DIET - DYS 1 Room service appropriate? No; Fluid consistency: Nectar Thick; Fluid restriction: 2000 mL Fluid  Diet effective now                 EDUCATION NEEDS:   Education needs have been addressed  Skin:  Skin Assessment: Skin Integrity Issues: Skin Integrity Issues:: Stage II Stage II: vertebral column  Last BM:  04/24/21  Height:   Ht Readings from Last 1 Encounters:  04/20/21 5'  2" (1.575 m)    Weight:   Wt Readings from Last 1 Encounters:  04/25/21 45.9 kg    Ideal Body Weight:  50 kg  BMI:  Body mass index is 18.51 kg/m.  Estimated Nutritional Needs:   Kcal:  1450-1650  Protein:  55-70 grams  Fluid:  > 1.4 L    Levada Schilling, RD, LDN, CDCES Registered Dietitian II Certified Diabetes Care and Education Specialist Please refer to [facility] for RD and/or RD on-call/weekend/after hours pager

## 2021-04-25 NOTE — TOC Progression Note (Addendum)
Transition of Care [facility]) - Progression Note    Patient Details  Name: Anna Ewing MRN: [id number] Date of Birth: [birth year]  Transition of [facility]) CM/SW Contact  Terrial Rhodes, LCSWA Phone Number: 04/25/2021, 11:50 AM  Clinical Narrative:     CSW received insurance authorization approval information. [id number] [employer] ID# [id number]. Patient has been approved from 6/8-6/10. Next review date is 6/10. Patient has SNF bed at [facility] when medically ready.Patents daughter requested that patient get booster if possible before dc. CSW informed MD of patients daughters request due to patients current orientation being only to self. CSW will continue to follow and assist with discharge planning needs.         Expected Discharge Plan and Services                                                 Social Determinants of Health (SDOH) Interventions    Readmission Risk Interventions No flowsheet data found.

## 2021-04-26 LAB — BASIC METABOLIC PANEL
Anion gap: 13 (ref 5–15)
Anion gap: 10 (ref 5–15)
BUN: 32 mg/dL — ABNORMAL HIGH (ref 8–23)
BUN: 33 mg/dL — ABNORMAL HIGH (ref 8–23)
CO2: 26 mmol/L (ref 22–32)
CO2: 26 mmol/L (ref 22–32)
Calcium: 8.6 mg/dL — ABNORMAL LOW (ref 8.9–10.3)
Calcium: 8.6 mg/dL — ABNORMAL LOW (ref 8.9–10.3)
Chloride: 89 mmol/L — ABNORMAL LOW (ref 98–111)
Chloride: 91 mmol/L — ABNORMAL LOW (ref 98–111)
Creatinine, Ser: 1.44 mg/dL — ABNORMAL HIGH (ref 0.44–1.00)
Creatinine, Ser: 1.64 mg/dL — ABNORMAL HIGH (ref 0.44–1.00)
GFR, Estimated: 35 mL/min — ABNORMAL LOW (ref >60)
GFR, Estimated: 30 mL/min — ABNORMAL LOW (ref >60)
Glucose, Bld: 122 mg/dL — ABNORMAL HIGH (ref 70–99)
Glucose, Bld: 165 mg/dL — ABNORMAL HIGH (ref 70–99)
Potassium: 3.8 mmol/L (ref 3.5–5.1)
Potassium: 4 mmol/L (ref 3.5–5.1)
Sodium: 128 mmol/L — ABNORMAL LOW (ref 135–145)
Sodium: 127 mmol/L — ABNORMAL LOW (ref 135–145)

## 2021-04-26 MED ORDER — SODIUM CHLORIDE 0.9 % IV SOLN: INTRAVENOUS | Status: DC

## 2021-04-26 NOTE — Plan of Care (Signed)
  Problem: Clinical Measurements: Goal: Respiratory complications will improve Outcome: Progressing   

## 2021-04-26 NOTE — Progress Notes (Addendum)
PROGRESS NOTE   Anna Ewing  BZJ:[id number] DOB: 19-Feb-1929 DOA: 04/20/2021 PCP: Margaree Mackintosh, MD  Brief Narrative:  87 year old community dwelling white female HTN HLD HFpEF Prediabetes A1c 6.3 Hospitalization 03/12/2020 intertrochanteric left femoral fracture  Recent hospitalization OSH-DC 04/17/2021-recommended SNF placement however family decided to take patient home Evaluated by PCP 04/20/2021 who referred her to ER Found to have 3+ pitting edema hyponatremia sodium 130 BNP 936 troponin 622-->596 O2 sat on room air 87% improving to 98% Mild hypokalemia  Cardiology consulted secondary to echo showing severe hypokinesis inferior base-AV valve = moderate AS moderate MR not a candidate for invasive procedures h/o mild dementia and multiple comorbidities  Hospital-Problem based course  Acute decompensated diastolic heart failure Moderate aortic stenosis and mild mitral regurgitation Demand ischemia per cardiology, not ACS Poor cardiac cath candidate Continue Toprol 12.5 twice  Torsemide held 6/9 Hypovolemic hyponatremia Mild rising creatinine with AKI superimposed on CKD 2 Specific gravity 1.008 on admission Urinary electrolytes suggest hypovolemic hypovolemia  Diuretics stopped 6/9 Saline 40 cc/H--rpt in a.m. to ensure appropriate rise Hyperglycemia without diagnosis of DM TY 2 Not on any meds for diabetes Monitor trends only at this time discontinue aggressive monitoring if not eating ?  Risk for aspiration MBS performed this admission Outpatient consideration regarding dysphagia versus endoscopy SLP recommends dysphagia 1 nectar thick Continue supplements and thickening agents HTN Hold amlodipine at this time monitor trends   DVT prophylaxis: Lovenox Code Status: Full Family Communication: Long discussion with son Anna Ewing [phone] at the bedside 6/8 and then discussed with the patient's daughter Anna Ewing at the bedside 6/9 Disposition:  Status is:  Inpatient  Remains inpatient appropriate because:Persistent severe electrolyte disturbances and Unsafe d/c plan   Dispo: The patient is from: Home              Anticipated d/c is to: SNF              Patient currently is not medically stable to d/c.   Difficult to place patient No  Patient is not stable at this time for discharge patient has ATN and requires IV fluid in addition to frequent lab monitoring-expected discharge possibly as early as 6/10-if not we will need to discuss implications of azotemia with family Patient will require skilled placement/rolling walker 5 wheels 3 and 1     Consultants:  Cardiology-they have signed off as of 6/8  Procedures: Echo  Antimicrobials: None   Subjective:  Doing fair but not in acute distress No chest pain Review of systems is a little bit limited because of her underlying cognitive issues Daughter was at bedside subsequently and had questions about disposition as per discussion above   Objective: Vitals:   04/25/21 2051 04/26/21 0521 04/26/21 0535 04/26/21 1059  BP: (!) 144/69 (!) 149/77  119/68  Pulse: 88 89  86  Resp: 20 16    Temp: 98.7 F (37.1 C) 98.9 F (37.2 C)  98.5 F (36.9 C)  TempSrc: Oral Oral  Oral  SpO2: 94% 92%  94%  Weight:   45.1 kg   Height:        Intake/Output Summary (Last 24 hours) at 04/26/2021 1114 Last data filed at 04/26/2021 0957 Gross per 24 hour  Intake 840 ml  Output 700 ml  Net 140 ml    Filed Weights   04/24/21 0453 04/25/21 0304 04/26/21 0535  Weight: 46.4 kg 45.9 kg 45.1 kg    Examination:  Awake and oriented x1 Bitemporal supraclavicular  wasting Moderate dentition Chest is clear no rales no rhonchi Abdomen is soft no rebound no guarding I cannot really appreciate much JVD or mucosas dry Psych is confused she has underlying cognitive issues  Data Reviewed: personally reviewed   Sodium 127-->128 chloride 90-->89 BUNs/creatinine 28/1.3-->32/1.4  Radiology Studies: No  results found.   Scheduled Meds:  enoxaparin (LOVENOX) injection  30 mg Subcutaneous Q24H   feeding supplement (NEPRO CARB STEADY)  237 mL Oral BID BM   metoprolol tartrate  12.5 mg Oral BID   multivitamin with minerals  1 tablet Oral Daily   potassium chloride  20 mEq Oral Daily   rosuvastatin  5 mg Oral Daily   sodium chloride flush  3 mL Intravenous Q12H   Vitamin D (Ergocalciferol)  50,000 Units Oral Weekly   Continuous Infusions:  sodium chloride     sodium chloride 40 mL/hr at 04/26/21 1113     LOS: 6 days   Time spent: 35  Rhetta Mura, MD [facility] To contact the attending provider between 7A-7P or the covering provider during after hours 7P-7A, please log into the web site [email or web address] and access using universal [place] password for that web site. If you do not have the password, please call the hospital operator.  04/26/2021, 11:14 AM

## 2021-04-27 ENCOUNTER — Inpatient Hospital Stay (HOSPITAL_COMMUNITY): Payer: Medicare Other

## 2021-04-27 LAB — CBC WITH DIFFERENTIAL/PLATELET
Abs Immature Granulocytes: 0.08 10*3/uL — ABNORMAL HIGH (ref 0.00–0.07)
Basophils Absolute: 0.1 10*3/uL (ref 0.0–0.1)
Basophils Relative: 0 %
Eosinophils Absolute: 0.5 10*3/uL (ref 0.0–0.5)
Eosinophils Relative: 4 %
HCT: 25.7 % — ABNORMAL LOW (ref 36.0–46.0)
Hemoglobin: 8.6 g/dL — ABNORMAL LOW (ref 12.0–15.0)
Immature Granulocytes: 1 %
Lymphocytes Relative: 14 %
Lymphs Abs: 1.7 10*3/uL (ref 0.7–4.0)
MCH: 28.9 pg (ref 26.0–34.0)
MCHC: 33.5 g/dL (ref 30.0–36.0)
MCV: 86.2 fL (ref 80.0–100.0)
Monocytes Absolute: 1 10*3/uL (ref 0.1–1.0)
Monocytes Relative: 8 %
Neutro Abs: 9 10*3/uL — ABNORMAL HIGH (ref 1.7–7.7)
Neutrophils Relative %: 73 %
Platelets: 437 10*3/uL — ABNORMAL HIGH (ref 150–400)
RBC: 2.98 MIL/uL — ABNORMAL LOW (ref 3.87–5.11)
RDW: 14.4 % (ref 11.5–15.5)
WBC: 12.2 10*3/uL — ABNORMAL HIGH (ref 4.0–10.5)
nRBC: 0 % (ref 0.0–0.2)

## 2021-04-27 LAB — RENAL FUNCTION PANEL
Albumin: 2.2 g/dL — ABNORMAL LOW (ref 3.5–5.0)
Anion gap: 10 (ref 5–15)
BUN: 32 mg/dL — ABNORMAL HIGH (ref 8–23)
CO2: 24 mmol/L (ref 22–32)
Calcium: 8.6 mg/dL — ABNORMAL LOW (ref 8.9–10.3)
Chloride: 93 mmol/L — ABNORMAL LOW (ref 98–111)
Creatinine, Ser: 1.54 mg/dL — ABNORMAL HIGH (ref 0.44–1.00)
GFR, Estimated: 32 mL/min — ABNORMAL LOW (ref >60)
Glucose, Bld: 115 mg/dL — ABNORMAL HIGH (ref 70–99)
Phosphorus: 3.5 mg/dL (ref 2.5–4.6)
Potassium: 4.2 mmol/L (ref 3.5–5.1)
Sodium: 127 mmol/L — ABNORMAL LOW (ref 135–145)

## 2021-04-27 LAB — SODIUM, URINE, RANDOM: Sodium, Ur: 63 mmol/L

## 2021-04-27 LAB — CREATININE, URINE, RANDOM: Creatinine, Urine: 29.24 mg/dL

## 2021-04-27 LAB — SARS CORONAVIRUS 2 (TAT 6-24 HRS): SARS Coronavirus 2: NEGATIVE

## 2021-04-27 MED ORDER — SODIUM CHLORIDE 0.9 % IV SOLN
2.0000 g | INTRAVENOUS | Status: DC
  Administered 2021-04-27 – 2021-04-29 (×3): 2 g via INTRAVENOUS
  Filled 2021-04-27 (×3): qty 20

## 2021-04-27 MED ORDER — METRONIDAZOLE 500 MG PO TABS
500.0000 mg | ORAL_TABLET | Freq: Two times a day (BID) | ORAL | Status: DC
  Administered 2021-04-27 – 2021-04-30 (×6): 500 mg via ORAL
  Filled 2021-04-27 (×6): qty 1

## 2021-04-27 NOTE — Progress Notes (Signed)
D/t her PCN allergy, ok to change zosyn to ceftriaxone/flagyl per Dr. Mahala Menghini.  Ulyses Southward, PharmD, BCIDP, AAHIVP, CPP Infectious Disease Pharmacist 04/27/2021 7:00 PM

## 2021-04-27 NOTE — TOC Progression Note (Signed)
Transition of Care [facility]) - Progression Note    Patient Details  Name: Anna Ewing MRN: [id number] Date of Birth: [birth year]  [facility]) CM/SW Contact  Terrial Rhodes, LCSWA Phone Number: 04/27/2021, 12:13 PM  Clinical Narrative:     CSW spoke with Tammy from [facility] to let her know that patient is not medically ready. Tammy confirmed she can accept patient if medically ready over the weekend. CSW informed MD. CSW will need to send in additional clinicals to patients insurance for review.      Expected Discharge Plan and Services                                                 Social Determinants of Health (SDOH) Interventions    Readmission Risk Interventions No flowsheet data found.

## 2021-04-27 NOTE — Progress Notes (Signed)
PROGRESS NOTE   Anna Ewing  GEX:[id number] DOB: 03/14/1929 DOA: 04/20/2021 PCP: Margaree Mackintosh, MD  Brief Narrative:  87 year old community dwelling white female HTN HLD HFpEF Prediabetes A1c 6.3 Hospitalization 03/12/2020 intertrochanteric left femoral fracture  Recent hospitalization OSH-DC 04/17/2021-recommended SNF placement however family decided to take patient home Evaluated by PCP 04/20/2021 who referred her to ER Found to have 3+ pitting edema hyponatremia sodium 130 BNP 936 troponin 622-->596 O2 sat on room air 87% improving to 98% Mild hypokalemia  Cardiology consulted secondary to echo showing severe hypokinesis inferior base-AV valve = moderate AS moderate MR not a candidate for invasive procedures h/o mild dementia and multiple comorbidities  Hospital-Problem based course   possible aspiration Coughing while eating 6/10-obtain CXR now and start Zosyn empirically given elevated white count SLP recommends dysphagia 1 nectar thick-I will asked them to revisit later this weekend as per them Continue supplements and thickening agents Acute decompensated diastolic heart failure Moderate aortic stenosis and mild mitral regurgitation Demand ischemia per cardiology, not ACS Poor cardiac cath candidate Continue Toprol 12.5 twice  Torsemide held 6/9 Hypovolemic hyponatremia-calculated osmolality today is 272 arguing for hypovolemia-clinically also appears dry Mild rising creatinine with AKI superimposed on CKD 2 Specific gravity 1.008 on admission Urinary electrolytes suggest hypovolemic hypovolemia  Diuretics stopped 6/9 Saline 40 cc/H was given for 16 hours then discontinued-Repeat urine electrolytes [U sodium, U osmolality] If sodium does not increase in the next 24 hours, will need input from nephrology Hyperglycemia without diagnosis of DM TY 2 Not on any meds for diabetes Monitor trends only at this time discontinue aggressive monitoring if not eating HTN Hold  amlodipine at this time monitor trends   DVT prophylaxis: Lovenox Code Status: DNAR Family Communication: Long discussion with son [name] [phone] on 6/10 I have explained to him that his mother is aspirating and he is aware of the risks of pneumonia and illness with aspiration-he confirms DNR status and although we did not broach palliative care comfort measures, if she does not improve we may need to have this discussion in the next 1 to 2 days Disposition:  Status is: Inpatient  Remains inpatient appropriate because:Persistent severe electrolyte disturbances and Unsafe d/c plan   Dispo: The patient is from: Home              Anticipated d/c is to: SNF              Patient currently is not medically stable to d/c.   Difficult to place patient No  Patient is not stable at this time for discharge patient has ATN and requires IV fluid in addition to frequent lab monitoring-expected discharge possibly as early as 6/10-if not we will need to discuss implications of azotemia with family Patient will require skilled placement/rolling walker 5 wheels 3 and 1     Consultants:  Cardiology-they have signed off as of 6/8  Procedures: Echo  Antimicrobials: None   Subjective:  Patient is awake alert to person but quite confused-she seems to however be at her baseline-she cannot tell me place time or year She is eating only about 30% of her meal Nursing reports she has been coughing continuously today She seems about the same as she has been over the past several days  Objective: Vitals:   04/27/21 0500 04/27/21 0539 04/27/21 1100 04/27/21 1109  BP:  (!) 157/81 131/68 131/68  Pulse:  92 95 95  Resp:  17  17  Temp:  98.5 F (36.9  C)    TempSrc:  Oral    SpO2:  97%  97%  Weight: 45.9 kg     Height:        Intake/Output Summary (Last 24 hours) at 04/27/2021 1751 Last data filed at 04/27/2021 1700 Gross per 24 hour  Intake 484.39 ml  Output 1250 ml  Net -765.61 ml     Filed Weights   04/25/21 0304 04/26/21 0535 04/27/21 0500  Weight: 45.9 kg 45.1 kg 45.9 kg    Examination:  Oriented x1 cachectic white female no distress bitemporal supraclavicular wasting S1-S2 holosystolic murmur-heart rate in the 90s on monitors Abdomen soft nontender no rebound no guarding Power grossly 5/5 Chest clear and lateral lung fields-patient was unable to sit up for full exam  Data Reviewed: personally reviewed   Sodium 127, Chloride 93 BUNs/creatinine 28/1.3-->32/1.4  Radiology Studies: No results found.   Scheduled Meds:  enoxaparin (LOVENOX) injection  30 mg Subcutaneous Q24H   feeding supplement (NEPRO CARB STEADY)  237 mL Oral BID BM   metoprolol tartrate  12.5 mg Oral BID   multivitamin with minerals  1 tablet Oral Daily   potassium chloride  20 mEq Oral Daily   rosuvastatin  5 mg Oral Daily   sodium chloride flush  3 mL Intravenous Q12H   Vitamin D (Ergocalciferol)  50,000 Units Oral Weekly   Continuous Infusions:  sodium chloride       LOS: 7 days   Time spent: 28  Rhetta Mura, MD [facility] To contact the attending provider between 7A-7P or the covering provider during after hours 7P-7A, please log into the web site [email or web address] and access using universal [place] password for that web site. If you do not have the password, please call the hospital operator.  04/27/2021, 5:51 PM

## 2021-04-27 NOTE — Care Management Important Message (Signed)
Important Message  Patient Details  Name: HARRIETT AZAR MRN: [id number] Date of Birth: January 02, 1929   Medicare Important Message Given:  Yes     Renie Ora 04/27/2021, 9:54 AM

## 2021-04-27 NOTE — Progress Notes (Signed)
Physical Therapy Treatment Patient Details Name: Anna Ewing MRN: [id number] DOB: 1929/07/21 Today's Date: 04/27/2021    History of Present Illness Pt is a 87 y/o female referred to the ER by her PCP for lower extremity edema and SOB. Pt admitted on 04/20/2021 with acute CHF. PMH significant of hypertension, hyperlipidemia, chronic rhinitis hyponatremia, chronic diastolic CHF who was recently hospitalized at [facility] for hyponatremia CHF and discharged on May 31. Chest x-ray showed interval increase in bibasilar atelectasis and mild fluid overload.    PT Comments    Patient progressing well towards PT goals. Improved ambulation distance with Min guard assist and use of RW for support. Continues to require min-Mod A to stand from chair with cues for technique. Incontinent of urine upon standing. VSS on RA with Sp02 >95%. HR up to 118 bpm. Productive cough noted throughout. Cognitive deficits at baseline. Continues to be appropriate for SNF as pt a high fall risk. Will follow.    Follow Up Recommendations  SNF;Supervision for mobility/OOB     Equipment Recommendations  Rolling walker with 5" wheels;3in1 (PT)    Recommendations for Other Services       Precautions / Restrictions Precautions Precautions: Fall;Other (comment) Precaution Comments: Incontinent of bladder/bowel Restrictions Weight Bearing Restrictions: No    Mobility  Bed Mobility Overal bed mobility: Needs Assistance Bed Mobility: Supine to Sit     Supine to sit: Mod assist;HOB elevated     General bed mobility comments: up in chair upon PT arrival.    Transfers Overall transfer level: Needs assistance Equipment used: Rolling walker (2 wheeled) Transfers: Sit to/from Stand Sit to Stand: Mod assist;Min assist         General transfer comment: Mod-Min A to power to standing from chair x3; incontinent of urine upon standing.  Ambulation/Gait Ambulation/Gait assistance: Min guard Gait Distance  (Feet): 65 Feet Assistive device: Rolling walker (2 wheeled) Gait Pattern/deviations: Step-through pattern;Decreased stride length;Trunk flexed Gait velocity: decreased   General Gait Details: Slow, unsteady gait with flexed trunk and RW too far anterior; cues for RW proximity and assist with turns. VSS on RA, Sp02 >95% and HR up to 118 bpm.   Stairs             Wheelchair Mobility    Modified Rankin (Stroke Patients Only)       Balance Overall balance assessment: Needs assistance Sitting-balance support: Feet supported;No upper extremity supported Sitting balance-Leahy Scale: Fair     Standing balance support: During functional activity Standing balance-Leahy Scale: Poor Standing balance comment: Requires UE support in standing.                            Cognition Arousal/Alertness: Awake/alert Behavior During Therapy: WFL for tasks assessed/performed Overall Cognitive Status: No family/caregiver present to determine baseline cognitive functioning                                 General Comments: Hx of dementia. Requires frequent repetition due to [facility] but reads lips well.      Exercises      General Comments General comments (skin integrity, edema, etc.): VSS on RA with Sp02 >95%. HR up to 118 bpm. Productive cough noted. Son present during session.      Pertinent Vitals/Pain Pain Assessment: Faces Faces Pain Scale: No hurt Pain Location: grimacing with mobility and transfers Pain Descriptors / Indicators:  Grimacing Pain Intervention(s): Monitored during session    Home Living                      Prior Function            PT Goals (current goals can now be found in the care plan section) Acute Rehab PT Goals Patient Stated Goal: try to eat some food even though not very hungry Progress towards PT goals: Progressing toward goals    Frequency    Min 2X/week      PT Plan Current plan remains appropriate     Co-evaluation              AM-PAC PT "6 Clicks" Mobility   Outcome Measure  Help needed turning from your back to your side while in a flat bed without using bedrails?: A Little Help needed moving from lying on your back to sitting on the side of a flat bed without using bedrails?: A Lot Help needed moving to and from a bed to a chair (including a wheelchair)?: A Little Help needed standing up from a chair using your arms (e.g., wheelchair or bedside chair)?: A Lot Help needed to walk in hospital room?: A Little Help needed climbing 3-5 steps with a railing? : A Lot 6 Click Score: 15    End of Session Equipment Utilized During Treatment: Gait belt Activity Tolerance: Patient limited by fatigue;Patient tolerated treatment well Patient left: in chair;with call bell/phone within reach;with chair alarm set;with family/visitor present Nurse Communication: Mobility status PT Visit Diagnosis: Other abnormalities of gait and mobility (R26.89);Muscle weakness (generalized) (M62.81);Difficulty in walking, not elsewhere classified (R26.2)     Time: 0347-4259 PT Time Calculation (min) (ACUTE ONLY): 26 min  Charges:  $Gait Training: 8-22 mins $Therapeutic Activity: 8-22 mins                     Vale Haven, PT, DPT Acute Rehabilitation Services Pager [phone] Office [phone]      Blake Divine A Lanier Ensign 04/27/2021, 1:24 PM

## 2021-04-27 NOTE — Progress Notes (Signed)
Occupational Therapy Treatment Patient Details Name: Anna Ewing MRN: [id number] DOB: 1929/01/31 Today's Date: 04/27/2021    History of present illness Pt is a 87 y/o female referred to the ER by her PCP for lower extremity edema and SOB. Pt admitted on 04/20/2021 with acute CHF. PMH significant of hypertension, hyperlipidemia, chronic rhinitis hyponatremia, chronic diastolic CHF who was recently hospitalized at [facility] for hyponatremia CHF and discharged on May 31. Chest x-ray showed interval increase in bibasilar atelectasis and mild fluid overload.   OT comments  Pt progressing towards acute OT goals. Pt reported feeling very fatigued at start of session but with some encouragement agreeable to OOB activity. Took pivotal steps to sit up in recliner, max A for pericare and LB ADLs in standing., +2 for safety helpful. Full session details below. D/c plan remains appropriate.    Follow Up Recommendations  SNF;Supervision/Assistance - 24 hour    Equipment Recommendations  3 in 1 bedside commode;Other (comment) (walker)    Recommendations for Other Services      Precautions / Restrictions Precautions Precautions: Fall;Other (comment) Precaution Comments: Pt immediately incontinent bowel and bladder upon standing Restrictions Weight Bearing Restrictions: No       Mobility Bed Mobility Overal bed mobility: Needs Assistance Bed Mobility: Supine to Sit     Supine to sit: Mod assist;HOB elevated     General bed mobility comments: Mod A to powerup and pivot hips to full EOB position    Transfers Overall transfer level: Needs assistance Equipment used: Rolling walker (2 wheeled) Transfers: Sit to/from Stand Sit to Stand: Mod assist;Min assist         General transfer comment: to/from EOB and recliner several times 2/2 incontinence. Assist to powerup and steady.    Balance Overall balance assessment: Needs assistance Sitting-balance support: Feet supported;No  upper extremity supported Sitting balance-Leahy Scale: Fair     Standing balance support: Bilateral upper extremity supported;During functional activity Standing balance-Leahy Scale: Poor Standing balance comment: BUE support of walke in static standing, min guard assist                           ADL either performed or assessed with clinical judgement   ADL Overall ADL's : Needs assistance/impaired             Lower Body Bathing: Maximal assistance;Sit to/from stand Lower Body Bathing Details (indicate cue type and reason): BUE support in static standing. fatigues quickly             Toileting- Clothing Manipulation and Hygiene: Maximal assistance;Sit to/from stand Toileting - Clothing Manipulation Details (indicate cue type and reason): Pt needs BUE support of rw and fatigues to min A while standing during pericare     Functional mobility during ADLs: Minimal assistance;Rolling walker (pivotal steps to reach recliner) General ADL Comments: Pt reported feeling very fatigued upon arrival of OT. With encouragment pt agreeable to trasnfer to recliner. Pericare and LB bathing/dressing as detailed above.     Vision       Perception     Praxis      Cognition Arousal/Alertness: Awake/alert Behavior During Therapy: WFL for tasks assessed/performed Overall Cognitive Status: Difficult to assess                                          Exercises  Shoulder Instructions       General Comments      Pertinent Vitals/ Pain       Pain Assessment: Faces Faces Pain Scale: Hurts little more Pain Location: grimacing with mobility and transfers Pain Descriptors / Indicators: Grimacing Pain Intervention(s): Monitored during session  Home Living                                          Prior Functioning/Environment              Frequency  Min 2X/week        Progress Toward Goals  OT Goals(current goals can now  be found in the care plan section)  Progress towards OT goals: Progressing toward goals  Acute Rehab OT Goals Patient Stated Goal: try to eat some food even though not very hungry OT Goal Formulation: With patient Time For Goal Achievement: 05/07/21 Potential to Achieve Goals: Fair ADL Goals Pt Will Perform Grooming: with supervision;standing Pt Will Perform Upper Body Dressing: with supervision;sitting Pt Will Perform Lower Body Dressing: with min assist;sit to/from stand;sitting/lateral leans Pt Will Transfer to Toilet: with min guard assist;ambulating  Plan Discharge plan remains appropriate    Co-evaluation                 AM-PAC OT "6 Clicks" Daily Activity     Outcome Measure   Help from another person eating meals?: A Little Help from another person taking care of personal grooming?: A Little Help from another person toileting, which includes using toliet, bedpan, or urinal?: A Lot Help from another person bathing (including washing, rinsing, drying)?: A Lot Help from another person to put on and taking off regular upper body clothing?: A Little Help from another person to put on and taking off regular lower body clothing?: A Lot 6 Click Score: 15    End of Session Equipment Utilized During Treatment: Rolling walker  OT Visit Diagnosis: Unsteadiness on feet (R26.81);Other abnormalities of gait and mobility (R26.89);Muscle weakness (generalized) (M62.81);Other symptoms and signs involving cognitive function   Activity Tolerance Patient limited by fatigue;Patient tolerated treatment well   Patient Left in chair;with call bell/phone within reach;with chair alarm set   Nurse Communication Other (comment) (RN and NT present for parts of OT session)        Time: [id number] OT Time Calculation (min): 30 min  Charges: OT General Charges $OT Visit: 1 Visit OT Treatments $Self Care/Home Management : 23-37 mins  Raynald Kemp, OT Acute Rehabilitation  Services Pager: [phone] Office: [phone]    Pilar Grammes 04/27/2021, 11:46 AM

## 2021-04-27 NOTE — TOC Progression Note (Signed)
Transition of Care [facility]) - Progression Note    Patient Details  Name: Anna Ewing MRN: [id number] Date of Birth: [birth year]  Transition of Care [facility] CM/SW Contact  Baldemar Lenis, [place] Phone Number: 04/27/2021, 2:03 PM  Clinical Narrative:   CSW following for discharge to SNF. Patient may be medically stable over the weekend. CSW contacted [facility] and updated insurance authorization request; Berkley Harvey has been extended through 6/14, updated clinicals must be sent then. CSW sent updated clinicals from hospital today to justify the extension. CSW contacted [facility] to update and obtain information on weekend admission. If patient is ready over the weekend, CSW to fax discharge summary to [phone]. Nurse is to call report to [phone]. CSW to follow.    Expected Discharge Plan: Skilled Nursing Facility Barriers to Discharge: Continued Medical Work up  Expected Discharge Plan and Services Expected Discharge Plan: Skilled Nursing Facility                                               Social Determinants of Health (SDOH) Interventions    Readmission Risk Interventions No flowsheet data found.

## 2021-04-28 LAB — TSH: TSH: 4.401 u[IU]/mL (ref 0.350–4.500)

## 2021-04-28 LAB — CBC WITH DIFFERENTIAL/PLATELET
Abs Immature Granulocytes: 0.08 10*3/uL — ABNORMAL HIGH (ref 0.00–0.07)
Basophils Absolute: 0 10*3/uL (ref 0.0–0.1)
Basophils Relative: 0 %
Eosinophils Absolute: 0.5 10*3/uL (ref 0.0–0.5)
Eosinophils Relative: 4 %
HCT: 21.8 % — ABNORMAL LOW (ref 36.0–46.0)
Hemoglobin: 7.2 g/dL — ABNORMAL LOW (ref 12.0–15.0)
Immature Granulocytes: 1 %
Lymphocytes Relative: 17 %
Lymphs Abs: 1.9 10*3/uL (ref 0.7–4.0)
MCH: 28.1 pg (ref 26.0–34.0)
MCHC: 33 g/dL (ref 30.0–36.0)
MCV: 85.2 fL (ref 80.0–100.0)
Monocytes Absolute: 0.9 10*3/uL (ref 0.1–1.0)
Monocytes Relative: 9 %
Neutro Abs: 7.7 10*3/uL (ref 1.7–7.7)
Neutrophils Relative %: 69 %
Platelets: 402 10*3/uL — ABNORMAL HIGH (ref 150–400)
RBC: 2.56 MIL/uL — ABNORMAL LOW (ref 3.87–5.11)
RDW: 14.2 % (ref 11.5–15.5)
WBC: 11.1 10*3/uL — ABNORMAL HIGH (ref 4.0–10.5)
nRBC: 0 % (ref 0.0–0.2)

## 2021-04-28 LAB — RENAL FUNCTION PANEL
Albumin: 1.9 g/dL — ABNORMAL LOW (ref 3.5–5.0)
Anion gap: 11 (ref 5–15)
BUN: 37 mg/dL — ABNORMAL HIGH (ref 8–23)
CO2: 24 mmol/L (ref 22–32)
Calcium: 8.4 mg/dL — ABNORMAL LOW (ref 8.9–10.3)
Chloride: 93 mmol/L — ABNORMAL LOW (ref 98–111)
Creatinine, Ser: 1.64 mg/dL — ABNORMAL HIGH (ref 0.44–1.00)
GFR, Estimated: 30 mL/min — ABNORMAL LOW (ref >60)
Glucose, Bld: 124 mg/dL — ABNORMAL HIGH (ref 70–99)
Phosphorus: 3.1 mg/dL (ref 2.5–4.6)
Potassium: 4.1 mmol/L (ref 3.5–5.1)
Sodium: 128 mmol/L — ABNORMAL LOW (ref 135–145)

## 2021-04-28 LAB — CORTISOL-AM, BLOOD: Cortisol - AM: 17.7 ug/dL (ref 6.7–22.6)

## 2021-04-28 LAB — SODIUM, URINE, RANDOM: Sodium, Ur: 54 mmol/L

## 2021-04-28 LAB — OSMOLALITY, URINE: Osmolality, Ur: 319 mOsm/kg (ref 300–900)

## 2021-04-28 MED ORDER — SODIUM CHLORIDE 0.9 % IV SOLN: INTRAVENOUS | Status: DC

## 2021-04-28 MED ORDER — SODIUM CHLORIDE 1 G PO TABS
1.0000 g | ORAL_TABLET | Freq: Two times a day (BID) | ORAL | Status: DC
  Administered 2021-04-29 (×2): 1 g via ORAL
  Filled 2021-04-28 (×4): qty 1

## 2021-04-28 NOTE — Plan of Care (Signed)
  Problem: Safety: Goal: Ability to remain free from injury will improve Outcome: Progressing   

## 2021-04-28 NOTE — Progress Notes (Signed)
PROGRESS NOTE   Anna Ewing  IWP:[id number] DOB: 10/02/1929 DOA: 04/20/2021 PCP: Margaree Mackintosh, MD  Brief Narrative:  87 year old community dwelling white female HTN HLD HFpEF Prediabetes A1c 6.3 Hospitalization 03/12/2020 intertrochanteric left femoral fracture  Recent hospitalization OSH-DC 04/17/2021-recommended SNF placement however family decided to take patient home Evaluated by PCP 04/20/2021 who referred her to ER Found to have 3+ pitting edema hyponatremia sodium 130 BNP 936 troponin 622-->596 O2 sat on room air 87% improving to 98% Mild hypokalemia  Cardiology consulted secondary to echo showing severe hypokinesis inferior base-AV valve = moderate AS moderate MR not a candidate for invasive procedures h/o mild dementia and multiple comorbidities  Hospital-Problem based course   possible aspiration SLP recommending dysphagia 1 diet--she is a poor candidate given her aortic stenosis for any instrumentation/anesthetic as that may put her at higher risk for respiratory decompensation CXR 6/10 = persistent LLL airspace consolidation Acute decompensated diastolic heart failure Moderate aortic stenosis and mild mitral regurgitation Demand ischemia per cardiology, not ACS Poor cardiac cath candidate Continue Toprol 12.5 twice  Torsemide held 6/9 Hypovolemic hyponatremia Mild rising creatinine with AKI superimposed on CKD 2 Specific gravity 1.008 on admission-current serum osmolality 276 indicative of hypotonic hyponatremia-thiazides have been stopped-she is not hypotensive [U sodium = 54, U osmolality 319] Will challenge with high salt diet, add salt tabs/NS @ 40 cc/h---Repeat U sod and U osm in am Hyperglycemia without diagnosis of DM TY 2 Not on any meds for diabetes Monitor trends only at this time discontinue aggressive monitoring if not eating HTN Hold amlodipine at this time monitor trends   DVT prophylaxis: Lovenox Code Status: DNAR Family Communication: Long  discussion with son Marcel Sorter [phone] on 6/10 I have explained to him that his mother is aspirating and he is aware of the risks of pneumonia and illness with aspiration-he confirms DNR status  Disposition:  Status is: Inpatient  Remains inpatient appropriate because:Persistent severe electrolyte disturbances and Unsafe d/c plan   Dispo: The patient is from: Home              Anticipated d/c is to: SNF              Patient currently is not medically stable to d/c.   Difficult to place patient No  Patient is not stable at this time for discharge patient has ATN and requires IV fluid in addition to frequent lab monitoring-expected discharge possibly as early as 6/10-if not we will need to discuss implications of azotemia with family Patient will require skilled placement/rolling walker 5 wheels 3 and 1     Consultants:  Cardiology-they have signed off as of 6/8  Procedures: Echo  Antimicrobials: None   Subjective:  Patient is little bit more lively She is not eating much however She has no chest pain She has no fever or chills but she is not coherent  Objective: Vitals:   04/28/21 0800 04/28/21 0947 04/28/21 1219 04/28/21 1610  BP:  (!) 126/56 (!) 117/56   Pulse:  76 88   Resp:  16 16   Temp:  98.1 F (36.7 C) 97.6 F (36.4 C)   TempSrc:  Oral Oral   SpO2: 92%  96% 96%  Weight:      Height:        Intake/Output Summary (Last 24 hours) at 04/28/2021 1746 Last data filed at 04/28/2021 1425 Gross per 24 hour  Intake 580 ml  Output 800 ml  Net -220 ml  Filed Weights   04/26/21 0535 04/27/21 0500 04/28/21 0410  Weight: 45.1 kg 45.9 kg 46.6 kg    Examination:  Cachectic white female more arousable than prior CTA B no rales no rhonchi however only lateral lung fields examined given her weakness Abdomen soft no rebound Neurologically intact no focal deficit moving all 4 limbs equally ROM intact  Data Reviewed: personally reviewed   Sodium 128,  Chloride 93 BUNs/creatinine 37/1.6  Radiology Studies: DG CHEST PORT 1 VIEW  Result Date: 04/27/2021 CLINICAL DATA:  Pneumonia. EXAM: PORTABLE CHEST 1 VIEW COMPARISON:  Chest x-ray 04/20/2021 FINDINGS: The cardiac silhouette, mediastinal and hilar contours are stable. There is tortuosity and calcification of the thoracic aorta. Stable right apical pleural thickening. Persistent left lower lobe airspace consolidation. Probable small left effusion also. IMPRESSION: Persistent left lower lobe airspace consolidation and probable small effusion. Electronically Signed   By: Rudie Meyer M.D.   On: 04/27/2021 18:35     Scheduled Meds:  enoxaparin (LOVENOX) injection  30 mg Subcutaneous Q24H   feeding supplement (NEPRO CARB STEADY)  237 mL Oral BID BM   metoprolol tartrate  12.5 mg Oral BID   metroNIDAZOLE  500 mg Oral Q12H   multivitamin with minerals  1 tablet Oral Daily   potassium chloride  20 mEq Oral Daily   rosuvastatin  5 mg Oral Daily   sodium chloride flush  3 mL Intravenous Q12H   [START ON 04/29/2021] sodium chloride  1 g Oral BID WC   Vitamin D (Ergocalciferol)  50,000 Units Oral Weekly   Continuous Infusions:  sodium chloride     cefTRIAXone (ROCEPHIN)  IV 2 g (04/27/21 2128)     LOS: 8 days   Time spent: 42  Rhetta Mura, MD [facility] To contact the attending provider between 7A-7P or the covering provider during after hours 7P-7A, please log into the web site [email or web address] and access using universal [place] password for that web site. If you do not have the password, please call the hospital operator.  04/28/2021, 5:46 PM

## 2021-04-28 NOTE — Progress Notes (Addendum)
  Speech Language Pathology Treatment: Dysphagia  Patient Details Name: Anna Ewing MRN: [id number] DOB: 10/01/1929 Today's Date: 04/28/2021 Time: 8032-1224 SLP Time Calculation (min) (ACUTE ONLY): 15 min  Assessment / Plan / Recommendation Clinical Impression  Patient seen for f/u diagnostic treatment at the request of MD due to concerns for aspiration. Swallowing function at bedside today appearing consistent with function during previous bedside interactions and recent MBS. Patient with consistent throat clearing and intermittent expectoration of bolus mixed with secretions both with and without ( secretions) pos, likely a build up of pharyngeal residue above the UES due to poor UES opening. Observation with Nepro did reveal intermittent coughing which SLP does suspect is related to aspiration, but again cough appearing productive as it was during MBS. Note that Nepro likely slightly thinner than a true nectar thick consistency. Recommend thickening this to nectar thick with use of thickener as you would any other thin liquid to facilitate best chance of airway protection. Do not feel that repeat instrumental study is needed at this time, unlikely to show new information. If MD feels that further diagnostics/intevention warranted, could consider esophageal w/u, primarily to assess lower esophageal function, as it may be impacting UES relaxation in the setting of otherwise intact appearing laryngeal musculature. Do not know however if patient would be a candidate for further esophageal intervention even if diagnostics reveal dysfunction.  Son has been educated regarding risk of aspiration by previous SLP this admission. If patient wishes to continue pos with known aspiration risk, current diet remains most appropriate.    HPI HPI: PAMELA INTRIERI is a 87 y.o. female with medical history significant of hypertension, hyperlipidemia, chronic rhinitis hyponatremia, chronic diastolic CHF.  Pt was admitted  with acute CHF.  CXR on 04/20/21 reported "Interval increase in bibasilar atelectasis and effusions which may be due to hypoventilation or mild fluid overload."      SLP Plan  Continue with current plan of care       Recommendations  Diet recommendations: Dysphagia 1 (puree);Nectar-thick liquid Liquids provided via: Straw;Cup Medication Administration: Crushed with puree Supervision: Patient able to self feed;Full supervision/cueing for compensatory strategies Compensations: Slow rate;Small sips/bites;Clear throat after each swallow Postural Changes and/or Swallow Maneuvers: Seated upright 90 degrees;Upright 30-60 min after meal                Oral Care Recommendations: Oral care BID Follow up Recommendations: Skilled Nursing facility SLP Visit Diagnosis: Dysphagia, pharyngoesophageal phase (R13.14) Plan: Continue with current plan of care       GO             Meryl Hubers MA, CCC-SLP    Laney Bagshaw Meryl 04/28/2021, 9:43 AM

## 2021-04-29 LAB — RENAL FUNCTION PANEL
Albumin: 2 g/dL — ABNORMAL LOW (ref 3.5–5.0)
Anion gap: 9 (ref 5–15)
BUN: 39 mg/dL — ABNORMAL HIGH (ref 8–23)
CO2: 25 mmol/L (ref 22–32)
Calcium: 8.4 mg/dL — ABNORMAL LOW (ref 8.9–10.3)
Chloride: 97 mmol/L — ABNORMAL LOW (ref 98–111)
Creatinine, Ser: 1.75 mg/dL — ABNORMAL HIGH (ref 0.44–1.00)
GFR, Estimated: 27 mL/min — ABNORMAL LOW (ref >60)
Glucose, Bld: 118 mg/dL — ABNORMAL HIGH (ref 70–99)
Phosphorus: 3.9 mg/dL (ref 2.5–4.6)
Potassium: 3.8 mmol/L (ref 3.5–5.1)
Sodium: 131 mmol/L — ABNORMAL LOW (ref 135–145)

## 2021-04-29 LAB — CBC WITH DIFFERENTIAL/PLATELET
Abs Immature Granulocytes: 0.09 10*3/uL — ABNORMAL HIGH (ref 0.00–0.07)
Basophils Absolute: 0 10*3/uL (ref 0.0–0.1)
Basophils Relative: 0 %
Eosinophils Absolute: 0.4 10*3/uL (ref 0.0–0.5)
Eosinophils Relative: 4 %
HCT: 23.2 % — ABNORMAL LOW (ref 36.0–46.0)
Hemoglobin: 7.5 g/dL — ABNORMAL LOW (ref 12.0–15.0)
Immature Granulocytes: 1 %
Lymphocytes Relative: 14 %
Lymphs Abs: 1.5 10*3/uL (ref 0.7–4.0)
MCH: 28.1 pg (ref 26.0–34.0)
MCHC: 32.3 g/dL (ref 30.0–36.0)
MCV: 86.9 fL (ref 80.0–100.0)
Monocytes Absolute: 0.8 10*3/uL (ref 0.1–1.0)
Monocytes Relative: 8 %
Neutro Abs: 7.8 10*3/uL — ABNORMAL HIGH (ref 1.7–7.7)
Neutrophils Relative %: 73 %
Platelets: 467 10*3/uL — ABNORMAL HIGH (ref 150–400)
RBC: 2.67 MIL/uL — ABNORMAL LOW (ref 3.87–5.11)
RDW: 14.6 % (ref 11.5–15.5)
WBC: 10.7 10*3/uL — ABNORMAL HIGH (ref 4.0–10.5)
nRBC: 0 % (ref 0.0–0.2)

## 2021-04-29 LAB — OSMOLALITY, URINE: Osmolality, Ur: 401 mOsm/kg (ref 300–900)

## 2021-04-29 LAB — SODIUM, URINE, RANDOM: Sodium, Ur: 76 mmol/L

## 2021-04-29 NOTE — Progress Notes (Signed)
PROGRESS NOTE   Anna Ewing  WSF:[id number] DOB: 02-11-1929 DOA: 04/20/2021 PCP: Margaree Mackintosh, MD  Brief Narrative:  87 year old community dwelling white female HTN HLD HFpEF Prediabetes A1c 6.3 Hospitalization 03/12/2020 intertrochanteric left femoral fracture  Recent hospitalization OSH-DC 04/17/2021-recommended SNF placement however family decided to take patient home Evaluated by PCP 04/20/2021 who referred her to ER Found to have 3+ pitting edema hyponatremia sodium 130 BNP 936 troponin 622-->596 O2 sat on room air 87% improving to 98% Mild hypokalemia  Cardiology consulted secondary to echo showing severe hypokinesis inferior base-AV valve = moderate AS moderate MR not a candidate for invasive procedures h/o mild dementia and multiple comorbidities  Hospital-Problem based course  Recurring aspiration SLP recommending dysphagia 1 diet--she is a poor candidate given her aortic stenosis for any instrumentation/anesthetic as that may put her at higher risk for respiratory decompensation CXR 6/10 = persistent LLL airspace consolidation concerning for persistent aspiration Discussed with her son as below Acute decompensated diastolic heart failure Moderate aortic stenosis and mild mitral regurgitation Demand ischemia per cardiology, not ACS Poor cardiac cath candidate given multiple comorbidities Continue Toprol 12.5 twice  Torsemide held 6/9 Hypovolemic hyponatremia Mild rising creatinine with AKI superimposed on CKD 2 Sodium is improved, and current serum osmolality = 280  [U sodium = 76 up from 54, urine osmolality 319-->401] Continue salt tablets, NS 40 cc/H has been saline locked Not ready for discharge sodium needs to be closer to baseline 135 Hyperglycemia without diagnosis of DM TY 2 Not on any meds for diabetes Monitor trends only at this time discontinue aggressive monitoring if not eating HTN Hold amlodipine at this time monitor trends   DVT prophylaxis:  Lovenox Code Status: DNAR Family Communication:  Long discussion with son Anna Ewing [phone] on 6/10 I have explained to him that his mother is aspirating and he is aware of the risks of pneumonia and illness with aspiration The patient's son understands the risk of doing an endoscopy however with patient not really eating and regurgitating after eating, he does want an opinion from gastroenterology before making any decisions The patient's sodium is slowly improving however remains somewhat intravascularly volume depleted and is not ready for discharge at this time but may be ready in 72 hours  Disposition:  Status is: Inpatient  Remains inpatient appropriate because:Persistent severe electrolyte disturbances and Unsafe d/c plan   Dispo: The patient is from: Home              Anticipated d/c is to: SNF              Patient currently is not medically stable to d/c.   Difficult to place patient No  Patient is not stable at this time for discharge patient has ATN and requires IV fluid in addition to frequent lab monitoring-expected discharge possibly as early as 6/10-if not we will need to discuss implications of azotemia with family Patient will require skilled placement/rolling walker 5 wheels 3 and 1     Consultants:  Cardiology-they have signed off as of 6/8  Procedures: Echo  Antimicrobials: None   Subjective:  Spitting up food has a kidney tray at the bedside with multiple napkins and She does not complain of chest pain She has not had any fevers She is otherwise not completely oriented but is pleasant and not agitated   Objective: Vitals:   04/28/21 1610 04/28/21 2200 04/29/21 0129 04/29/21 0400  BP:  (!) 151/73    Pulse:  87  94  Resp:    18  Temp:  98.3 F (36.8 C)  98.2 F (36.8 C)  TempSrc:  Oral  Oral  SpO2: 96% 96%  99%  Weight:   45.5 kg   Height:        Intake/Output Summary (Last 24 hours) at 04/29/2021 0740 Last data filed at 04/29/2021  0400 Gross per 24 hour  Intake 955.33 ml  Output 200 ml  Net 755.33 ml    Filed Weights   04/27/21 0500 04/28/21 0410 04/29/21 0129  Weight: 45.9 kg 46.6 kg 45.5 kg    Examination:  Awake coherent no distress frail cachectic white female Holosystolic murmur RUSB Cannot really appreciate rales or rhonchi posterior laterally however exam is performed No lower extremity edema Abdomen is soft nontender no rebound no guarding Neurologically intact however this week with 5/5 Reflexes deferred   Data Reviewed: personally reviewed   Sodium 128-->131, Chloride 93-->97 BUNs/creatinine 37/1.6-->39 /1.75  Radiology Studies: DG CHEST PORT 1 VIEW  Result Date: 04/27/2021 CLINICAL DATA:  Pneumonia. EXAM: PORTABLE CHEST 1 VIEW COMPARISON:  Chest x-ray 04/20/2021 FINDINGS: The cardiac silhouette, mediastinal and hilar contours are stable. There is tortuosity and calcification of the thoracic aorta. Stable right apical pleural thickening. Persistent left lower lobe airspace consolidation. Probable small left effusion also. IMPRESSION: Persistent left lower lobe airspace consolidation and probable small effusion. Electronically Signed   By: Rudie Meyer M.D.   On: 04/27/2021 18:35     Scheduled Meds:  enoxaparin (LOVENOX) injection  30 mg Subcutaneous Q24H   feeding supplement (NEPRO CARB STEADY)  237 mL Oral BID BM   metoprolol tartrate  12.5 mg Oral BID   metroNIDAZOLE  500 mg Oral Q12H   multivitamin with minerals  1 tablet Oral Daily   potassium chloride  20 mEq Oral Daily   sodium chloride  1 g Oral BID WC   Continuous Infusions:  cefTRIAXone (ROCEPHIN)  IV Stopped (04/29/21 0400)     LOS: 9 days   Time spent: 25 including advance care planning time  Rhetta Mura, MD [facility] To contact the attending provider between 7A-7P or the covering provider during after hours 7P-7A, please log into the web site [email or web address] and access using universal [place]  password for that web site. If you do not have the password, please call the hospital operator.  04/29/2021, 7:40 AM

## 2021-04-29 NOTE — Progress Notes (Signed)
Patient was able to tolerate her breakfast with the NT as stated by the NT. RN was able to give AM medications by mouth and patient tolerated fine. Took a sip in between. Will continue to monitor.

## 2021-04-29 NOTE — Consult Note (Signed)
Referring Provider: [facility] Primary Care Physician:  Margaree Mackintosh, MD Primary Gastroenterologist:  Dr. Matthias Hughs (did colonoscopy on the patient 13 years ago)  Reason for Consultation: Dysphagia  HPI: Anna Ewing is a 87 y.o. female with mild dementia admitted to the hospital 9 days ago because of severe lower extremity edema and found to have a sodium of 130.  Cardiology evaluation included finding of severe hypokinesis in the inferior myocardium, as well as moderate aortic stenosis and mitral regurgitation.  The patient's daughter, Anna Ewing, is at the bedside and provides much of the history.  The patient is only minimally verbal.  It sounds as though, with respect to the GI tract, the patient has had a rapid progression in oropharyngeal dysphagia symptoms over the past several months.  She was at [facility] last month and apparently had a swallowing evaluation there which apparently showed oropharyngeal dysphagia, no obvious discrete cause identified.  A repeat swallowing study here was accomplished yesterday and shows severe oropharyngeal dysphagia, with the patient at moderate risk for aspiration.  GI consultation has been requested by the family.  The patient's daughter indicates that today, she is actually swallowing quite a bit better and seems more interested in spontaneously taking food and drink, whereas the past couple of days, she was quite somnolent and almost had to be force-fed.  Chest x-ray obtained 2 days ago shows persistent left lower airspace consolidation.  The patient has been treated with antibiotics.   Past Medical History:  Diagnosis Date   Chronic rhinitis    Hyperlipidemia    Hypertension     Past Surgical History:  Procedure Laterality Date   INTRAMEDULLARY (IM) NAIL INTERTROCHANTERIC Left 03/12/2020   Procedure: INTRAMEDULLARY (IM) NAIL INTERTROCHANTRIC;  Surgeon: Roby Lofts, MD;  Location: [facility] OR;  Service: Orthopedics;  Laterality:  Left;   MANDIBLE SURGERY     cosmetic   NASAL SINUS SURGERY     ORIF HUMERUS FRACTURE Left 03/12/2020   Procedure: OPEN REDUCTION INTERNAL FIXATION (ORIF) PROXIMAL HUMERUS FRACTURE;  Surgeon: Roby Lofts, MD;  Location: [facility] OR;  Service: Orthopedics;  Laterality: Left;   VESICOVAGINAL FISTULA CLOSURE W/ TAH  2005    Prior to Admission medications   Medication Sig Start Date End Date Taking? Authorizing Provider  amLODipine (NORVASC) 5 MG tablet TAKE 1 TABLET BY MOUTH EVERY DAY Patient taking differently: Take 5 mg by mouth daily. 01/30/21  Yes Baxley, [name], MD  ergocalciferol (DRISDOL) 1.25 MG (50000 UT) capsule One po weekly for Vitamin D deficiency Patient taking differently: Take 50,000 Units by mouth once a week. 08/17/20  Yes Baxley, [name], MD  ibandronate (BONIVA) 150 MG tablet Take 1 tablet (150 mg total) by mouth every 30 (thirty) days. Take in the morning with a full glass of water, on an empty stomach, and do not take anything else by mouth or lie down for the next 30 min. Patient taking differently: Take 150 mg by mouth See admin instructions. Take in the morning with a full glass of water, on an empty stomach, and do not take anything else by mouth or lie down for the next 30 min. 08/17/20  Yes Baxley, [name], MD  metoprolol tartrate (LOPRESSOR) 25 MG tablet Take 12.5 mg by mouth 2 (two) times daily.   Yes [provider]  rosuvastatin (CRESTOR) 5 MG tablet TAKE 1 TABLET BY MOUTH EVERY DAY Patient taking differently: Take 5 mg by mouth daily. 09/21/20  Yes Baxley, [name],  MD  sodium chloride 1 g tablet Take 1 g by mouth 2 (two) times daily with a meal.   Yes [provider]  Cholecalciferol (VITAMIN D) 125 MCG (5000 UT) CAPS Take 5,000 Units by mouth daily. Patient not taking: No sig reported 03/14/20   Despina Hidden, PA-C  fluticasone [facility]) 50 MCG/ACT nasal spray 1 or 2 sprays each nostril, once daily at bedtime Patient not taking: No sig reported 07/01/11    Waymon Budge, MD    Current Facility-Administered Medications  Medication Dose Route Frequency Provider Last Rate Last Admin   acetaminophen (TYLENOL) tablet 650 mg  650 mg Oral Q4H PRN Rometta Emery, MD   650 mg at 04/25/21 1701   cefTRIAXone (ROCEPHIN) 2 g in sodium chloride 0.9 % 100 mL IVPB  2 g Intravenous Q24H Pham, Minh Q, RPH-CPP   Stopped at 04/29/21 0400   enoxaparin (LOVENOX) injection 30 mg  30 mg Subcutaneous Q24H Silvana Newness, RPH   30 mg at 04/28/21 2206   feeding supplement (NEPRO CARB STEADY) liquid 237 mL  237 mL Oral BID BM Noralee Stain, DO   237 mL at 04/29/21 6270   food thickener (SIMPLYTHICK (HONEY/LEVEL 3/MODERATELY THICK)) 5 packet  5 packet Oral PRN Noralee Stain, DO       metoprolol tartrate (LOPRESSOR) tablet 12.5 mg  12.5 mg Oral BID Earlie Lou L, MD   12.5 mg at 04/29/21 0826   metroNIDAZOLE (FLAGYL) tablet 500 mg  500 mg Oral Q12H Pham, Minh Q, RPH-CPP   500 mg at 04/29/21 3500   multivitamin with minerals tablet 1 tablet  1 tablet Oral Daily Noralee Stain, DO   1 tablet at 04/29/21 0826   ondansetron (ZOFRAN) injection 4 mg  4 mg Intravenous Q6H PRN Rometta Emery, MD       potassium chloride (KLOR-CON) packet 20 mEq  20 mEq Oral Daily Nahser, Deloris Ping, MD   20 mEq at 04/29/21 9381   sodium chloride tablet 1 g  1 g Oral BID WC Rhetta Mura, MD   1 g at 04/29/21 1619    Allergies as of 04/20/2021 - Review Complete 04/20/2021  Allergen Reaction Noted   Amoxicillin Rash     Family History  Problem Relation Age of Onset   Cancer Mother    Heart disease Mother    Heart disease Father     Social History   Socioeconomic History   Marital status: Married    Spouse name: Not on file   Number of children: 2   Years of education: Not on file   Highest education level: Not on file  Occupational History   Occupation: Retired    Associate Professor: OTHER    Comment: First Engineer, site  Tobacco Use   Smoking status:  Former    Packs/day: 0.50    Years: 10.00    Pack years: 5.00    Types: Cigarettes    Quit date: 09/13/1974    Years since quitting: 46.6   Smokeless tobacco: Never  Substance and Sexual Activity   Alcohol use: Never   Drug use: Never   Sexual activity: Not on file  Other Topics Concern   Not on file  Social History Narrative   Not on file   Social Determinants of Health   Financial Resource Strain: Not on file  Food Insecurity: Not on file  Transportation Needs: Not on file  Physical Activity: Not on file  Stress: Not on file  Social Connections: Not on file  Intimate Partner Violence: Not on file    Review of Systems: The patient's daughter indicates that the patient has definite cognitive impairment, of variable severity from day-to-day.  Physical Exam: Vital signs in last 24 hours: Temp:  [98.2 F (36.8 C)-98.4 F (36.9 C)] 98.4 F (36.9 C) (06/12 1103) Pulse Rate:  [85-94] 85 (06/12 1103) Resp:  [18] 18 (06/12 1103) BP: (151-158)/(73-74) 158/74 (06/12 0826) SpO2:  [96 %-99 %] 99 % (06/12 1103) Weight:  [45.5 kg] 45.5 kg (06/12 0129) Last BM Date: 04/28/21 This is a thin female sitting in bed, expectorating oral secretions frequently, but also drinking fluids without coughing.  She does not appear frankly cachectic, but does appear somewhat malnourished.  She is anicteric.  Heart exam shows an aortic stenosis type murmur, grade 2/6 to 3/6, chest grossly clear, abdomen nontender.  Intake/Output from previous day: 06/11 0701 - 06/12 0700 In: 955.3 [P.O.:480; I.V.:375.3; IV Piggyback:100] Out: 200 [Urine:200] Intake/Output this shift: Total I/O In: 580 [P.O.:580] Out: 1200 [Urine:1200]  Lab Results: Recent Labs    04/27/21 0330 04/28/21 0324 04/29/21 0237  WBC 12.2* 11.1* 10.7*  HGB 8.6* 7.2* 7.5*  HCT 25.7* 21.8* 23.2*  PLT 437* 402* 467*   BMET Recent Labs    04/27/21 0330 04/28/21 0324 04/29/21 0237  NA 127* 128* 131*  K 4.2 4.1 3.8  CL 93*  93* 97*  CO2 24 24 25   GLUCOSE 115* 124* 118*  BUN 32* 37* 39*  CREATININE 1.54* 1.64* 1.75*  CALCIUM 8.6* 8.4* 8.4*   LFT Recent Labs    04/29/21 0237  ALBUMIN 2.0*   PT/INR No results for input(s): LABPROT, INR in the last 72 hours.  Studies/Results: No results found.  Impression: Oropharyngeal dysphagia, questionable aspiration. Esophageal function is unknown, but is not relevant given the severity of her oropharyngeal dysfunction.    Plan:  I spent extensive time with the patient's daughter, 06/29/21, at the bedside.    Basically, the 2 options are maintaining the patient, as she has been doing, on a dysphagia diet, or opting for enteral nutritional support via a gastrostomy tube, keeping in mind that the latter would be a very reliable way of providing nutrition, but would not necessarily prevent pulmonary aspiration issues because the patient would still be at risk for aspiration of oral secretions.  I suggested that the patient may benefit from a palliative care consultation to help the family in making decisions about the best way to go.  If the decision is made to proceed with a gastrostomy tube, I would recommend that it be placed by interventional radiology.  Accordingly, I will sign off, but please call if I can be of further assistance in this patient's care.  Furthermore, I have given the patient's daughter my card in case she wants to call with questions in the future.   LOS: 9 days   Anna Ewing Anna Ewing  04/29/2021, 6:56 PM   Pager [phone] If no answer or after 5 PM call [phone]

## 2021-04-30 LAB — BASIC METABOLIC PANEL
Anion gap: 11 (ref 5–15)
BUN: 37 mg/dL — ABNORMAL HIGH (ref 8–23)
CO2: 23 mmol/L (ref 22–32)
Calcium: 8.6 mg/dL — ABNORMAL LOW (ref 8.9–10.3)
Chloride: 99 mmol/L (ref 98–111)
Creatinine, Ser: 1.8 mg/dL — ABNORMAL HIGH (ref 0.44–1.00)
GFR, Estimated: 26 mL/min — ABNORMAL LOW (ref >60)
Glucose, Bld: 128 mg/dL — ABNORMAL HIGH (ref 70–99)
Potassium: 4.1 mmol/L (ref 3.5–5.1)
Sodium: 133 mmol/L — ABNORMAL LOW (ref 135–145)

## 2021-04-30 LAB — CBC WITH DIFFERENTIAL/PLATELET
Abs Immature Granulocytes: 0.12 10*3/uL — ABNORMAL HIGH (ref 0.00–0.07)
Basophils Absolute: 0.1 10*3/uL (ref 0.0–0.1)
Basophils Relative: 0 %
Eosinophils Absolute: 0.4 10*3/uL (ref 0.0–0.5)
Eosinophils Relative: 3 %
HCT: 26.7 % — ABNORMAL LOW (ref 36.0–46.0)
Hemoglobin: 8.8 g/dL — ABNORMAL LOW (ref 12.0–15.0)
Immature Granulocytes: 1 %
Lymphocytes Relative: 12 %
Lymphs Abs: 1.5 10*3/uL (ref 0.7–4.0)
MCH: 28.3 pg (ref 26.0–34.0)
MCHC: 33 g/dL (ref 30.0–36.0)
MCV: 85.9 fL (ref 80.0–100.0)
Monocytes Absolute: 1.1 10*3/uL — ABNORMAL HIGH (ref 0.1–1.0)
Monocytes Relative: 9 %
Neutro Abs: 9.2 10*3/uL — ABNORMAL HIGH (ref 1.7–7.7)
Neutrophils Relative %: 75 %
Platelets: 454 10*3/uL — ABNORMAL HIGH (ref 150–400)
RBC: 3.11 MIL/uL — ABNORMAL LOW (ref 3.87–5.11)
RDW: 14.5 % (ref 11.5–15.5)
WBC: 12.3 10*3/uL — ABNORMAL HIGH (ref 4.0–10.5)
nRBC: 0 % (ref 0.0–0.2)

## 2021-04-30 LAB — SARS CORONAVIRUS 2 (TAT 6-24 HRS): SARS Coronavirus 2: NEGATIVE

## 2021-04-30 MED ORDER — LEVOFLOXACIN 500 MG PO TABS
500.0000 mg | ORAL_TABLET | ORAL | Status: DC
  Administered 2021-04-30: 500 mg via ORAL
  Filled 2021-04-30: qty 1

## 2021-04-30 NOTE — Progress Notes (Signed)
PROGRESS NOTE   Anna Ewing  PJK:[id number] DOB: 04-Dec-1928 DOA: 04/20/2021 PCP: Margaree Mackintosh, MD  Brief Narrative:  87 year old community dwelling white female HTN HLD HFpEF Prediabetes A1c 6.3 Hospitalization 03/12/2020 intertrochanteric left femoral fracture  Recent hospitalization OSH-DC 04/17/2021-recommended SNF placement however family decided to take patient home Evaluated by PCP 04/20/2021 who referred her to ER Found to have 3+ pitting edema hyponatremia sodium 130 BNP 936 troponin 622-->596 O2 sat on room air 87% improving to 98% Mild hypokalemia  Cardiology consulted secondary to echo showing severe hypokinesis inferior base-AV valve = moderate AS moderate MR not a candidate for invasive procedures h/o mild dementia and multiple comorbidities  Hospital-Problem based course  Recurring aspiration/severe oropharyngeal dysphagia CXR 6/10 = persistent LLL airspace consolidation concerning for persistent aspiration SLP recommending dysphagia 1 diet--appreciate Dr. Matthias Hughs input 6/12 Continues on ceftriaxone and Flagyl IV since 6/10-circumscribed course until 6/16 See below discussion-family deciding to go to SNF with palliative and will contemplate DNI/DNH in addition to DNR Acute decompensated diastolic heart failure Moderate aortic stenosis and mild mitral regurgitation Demand ischemia per cardiology, not ACS Poor cardiac cath candidate given multiple comorbidities Continue Toprol 12.5 twice  Torsemide held 6/9 Hypovolemic hyponatremia Mild rising creatinine with AKI superimposed on CKD 2 Sodium improved to 133 with salt tablets fluid restriction Discontinue salt tablets repeat labs in the morning Hyperglycemia without diagnosis of DM TY 2 Not on any meds for diabetes prior to admission Would not aggressively control her sugar given most readings are below 180 HTN Hold amlodipine at this time monitor trends   DVT prophylaxis: Lovenox Code Status: DNAR Family  Communication:  Long discussion with son Bijal Siglin [phone] on 6/10 I have explained to him that his mother is aspirating and he is aware of the risks of pneumonia and illness with aspiration The patient's son understands our discussions over the past several days regarding risks of endoscopy and poor likelihood of this changing likely long-term terminal trajectory He will discuss with his sister and get back to me later today with regards to disposition planning to SNF probably in the next 24 to 48 hours with palliative following  Disposition:  Status is: Inpatient  Remains inpatient appropriate because:Persistent severe electrolyte disturbances and Unsafe d/c plan   Dispo: The patient is from: Home              Anticipated d/c is to: SNF              Patient currently is not medically stable to d/c.   Difficult to place patient No  Patient is not stable at this time for discharge patient has ATN and requires IV fluid in addition to frequent lab monitoring-expected discharge possibly as early as 6/10-if not we will need to discuss implications of azotemia with family Patient will require skilled placement/rolling walker 5 wheels 3 and 1     Consultants:  Cardiology-they have signed off as of 6/8  Procedures: Echo  Antimicrobials: As above   Subjective:  Awake coherent no distress sitting up more lively appearing less lethargic Pleasantly confused Cannot obtain ROS   Objective: Vitals:   04/29/21 0826 04/29/21 1103 04/29/21 2008 04/30/21 0400  BP: (!) 158/74  (!) 154/92 (!) 168/84  Pulse: 88 85 98 86  Resp:  18 18 18   Temp:  98.4 F (36.9 C) 99.1 F (37.3 C) 98.6 F (37 C)  TempSrc:  Oral Oral Oral  SpO2:  99% 98% 98%  Weight:  45 kg  Height:        Intake/Output Summary (Last 24 hours) at 04/30/2021 0739 Last data filed at 04/29/2021 2000 Gross per 24 hour  Intake 580 ml  Output 1201 ml  Net -621 ml    Filed Weights   04/28/21 0410 04/29/21 0129  04/30/21 0400  Weight: 46.6 kg 45.5 kg 45 kg    Examination:  EOMI NCAT frail cachectic white female bitemporal supraclavicular wasting S1-S2 HSM RUSB Abdomen soft no rebound no guarding Trace lower extremity edema neurologically intact moving all 4 limbs Power 5/5  Pleasantly confused  Data Reviewed: personally reviewed   Sodium 128-->133, Chloride 93-->99 BUNs/creatinine 37/1.6-->39 /1.75-->57/1.8 White count 12.3 Hemoglobin 7.5-->8.8  Radiology Studies: No results found.   Scheduled Meds:  enoxaparin (LOVENOX) injection  30 mg Subcutaneous Q24H   feeding supplement (NEPRO CARB STEADY)  237 mL Oral BID BM   metoprolol tartrate  12.5 mg Oral BID   metroNIDAZOLE  500 mg Oral Q12H   multivitamin with minerals  1 tablet Oral Daily   potassium chloride  20 mEq Oral Daily   sodium chloride  1 g Oral BID WC   Continuous Infusions:  cefTRIAXone (ROCEPHIN)  IV Stopped (04/29/21 2110)     LOS: 10 days   Time spent: 25 including advance care planning time  Rhetta Mura, MD [facility] To contact the attending provider between 7A-7P or the covering provider during after hours 7P-7A, please log into the web site [email or web address] and access using universal [place] password for that web site. If you do not have the password, please call the hospital operator.  04/30/2021, 7:39 AM

## 2021-04-30 NOTE — Progress Notes (Signed)
OT Cancellation Note  Patient Details Name: LOLETTA HARPER MRN: [id number] DOB: [birth year]   Cancelled Treatment:    Reason Eval/Treat Not Completed: Fatigue/lethargy limiting ability to participate;Other (comment) pt received in chair declining ADL participation or functional mobility d/t fatigue. Will check back as time allows for OT session.  Pollyann Glen K., COTA/L Acute Rehabilitation Services [phone]   Barron Schmid 04/30/2021, 2:40 PM

## 2021-04-30 NOTE — Progress Notes (Signed)
   04/30/21 0958  Mobility  Activity Refused mobility (Patient sound asleep and snoring upon entry. Attempt to wake x3, did not respond to verbal or physical stimuli. Will check back)  Mobility Response RN notified

## 2021-04-30 NOTE — TOC Progression Note (Signed)
Transition of Care [facility]) - Progression Note    Patient Details  Name: Anna Ewing MRN: [id number] Date of Birth: 03-26-1929  Transition of Care [facility]) CM/SW Contact  Jimmy Picket, [place] Phone Number: 04/30/2021, 3:38 PM  Clinical Narrative:     CSW confirmed that [facility] accept pt tomorrow. CSW requested covid test from MD.   Expected Discharge Plan: Skilled Nursing Facility Barriers to Discharge: Continued Medical Work up  Expected Discharge Plan and Services Expected Discharge Plan: Skilled Nursing Facility                                               Social Determinants of Health (SDOH) Interventions    Readmission Risk Interventions No flowsheet data found.  Jimmy Picket, Theresia Majors, [place] Clinical Social Worker [phone]

## 2021-04-30 NOTE — Progress Notes (Signed)
  Speech Language Pathology Treatment: Dysphagia  Patient Details Name: Anna Ewing MRN: [id number] DOB: 03-10-1929 Today's Date: 04/30/2021 Time: 6578-4696 SLP Time Calculation (min) (ACUTE ONLY): 16 min  Assessment / Plan / Recommendation Clinical Impression  Pt seen for skilled dysphagia treatment with focus on diet tolerance and training in use of basic strategies to improve safety with PO intake with known risk for recurrent aspiration per MBS findings (04/22/21). Pt upright in chair, finishing pm meal consumption. Upon greeting SLP, pt exhibited wet vocal quality, requiring cued throat clear to improve. Dysphagia 1 solids and NTL via cup resulted in overt coughing x1 and consistent immediate throat clearing, followed by expectoration of small amounts of bolus mixed with secretions. Suspect throat clearing and cough response continue to be productive and necessary for airway protection as observed during MBS. Trained pt in consuming small bites/sips at slow rate and throat clearing after each swallow in order to decrease risk for aspiration of pharyngeal residuals as noted on MBS. While pt retained information to execute strategies after short delay, question carryover into functional mealtimes. If maintaining oral diet is desired, recommend pt continue on Dysphagia 1/NTL diet with full supervision and assist from staff for compensatory strategies to increase safety with PO intake. SLP to continue f/u.   HPI HPI: TOPACIO CELLA is a 87 y.o. female with medical history significant of hypertension, hyperlipidemia, chronic rhinitis hyponatremia, chronic diastolic CHF.  Pt was admitted with acute CHF.  CXR on 04/20/21 reported "Interval increase in bibasilar atelectasis and effusions which may be due to hypoventilation or mild fluid overload."      SLP Plan  Continue with current plan of care       Recommendations  Diet recommendations: Dysphagia 1 (puree);Nectar-thick liquid Liquids provided  via: Straw;Cup Medication Administration: Crushed with puree Supervision: Patient able to self feed;Full supervision/cueing for compensatory strategies Compensations: Slow rate;Small sips/bites;Clear throat after each swallow Postural Changes and/or Swallow Maneuvers: Seated upright 90 degrees;Upright 30-60 min after meal                Oral Care Recommendations: Oral care BID Follow up Recommendations: Skilled Nursing facility SLP Visit Diagnosis: Dysphagia, pharyngoesophageal phase (R13.14) Plan: Continue with current plan of care       GO              Avie Echevaria, MA, CCC-SLP Acute Rehabilitation Services Office Number: [phone]   Paulette Blanch 04/30/2021, 2:48 PM

## 2021-05-01 DIAGNOSIS — S72002D Fracture of unspecified part of neck of left femur, subsequent encounter for closed fracture with routine healing: Secondary | ICD-10-CM | POA: Diagnosis not present

## 2021-05-01 DIAGNOSIS — Z743 Need for continuous supervision: Secondary | ICD-10-CM | POA: Diagnosis not present

## 2021-05-01 DIAGNOSIS — M6281 Muscle weakness (generalized): Secondary | ICD-10-CM | POA: Diagnosis not present

## 2021-05-01 DIAGNOSIS — M810 Age-related osteoporosis without current pathological fracture: Secondary | ICD-10-CM | POA: Diagnosis not present

## 2021-05-01 DIAGNOSIS — R1313 Dysphagia, pharyngeal phase: Secondary | ICD-10-CM | POA: Diagnosis not present

## 2021-05-01 DIAGNOSIS — R2689 Other abnormalities of gait and mobility: Secondary | ICD-10-CM | POA: Diagnosis not present

## 2021-05-01 DIAGNOSIS — E46 Unspecified protein-calorie malnutrition: Secondary | ICD-10-CM | POA: Diagnosis not present

## 2021-05-01 DIAGNOSIS — E871 Hypo-osmolality and hyponatremia: Secondary | ICD-10-CM | POA: Diagnosis not present

## 2021-05-01 DIAGNOSIS — T148XXD Other injury of unspecified body region, subsequent encounter: Secondary | ICD-10-CM | POA: Diagnosis not present

## 2021-05-01 DIAGNOSIS — I5030 Unspecified diastolic (congestive) heart failure: Secondary | ICD-10-CM | POA: Diagnosis not present

## 2021-05-01 DIAGNOSIS — R531 Weakness: Secondary | ICD-10-CM | POA: Diagnosis not present

## 2021-05-01 DIAGNOSIS — N183 Chronic kidney disease, stage 3 unspecified: Secondary | ICD-10-CM | POA: Diagnosis not present

## 2021-05-01 DIAGNOSIS — S42202D Unspecified fracture of upper end of left humerus, subsequent encounter for fracture with routine healing: Secondary | ICD-10-CM | POA: Diagnosis not present

## 2021-05-01 DIAGNOSIS — I5033 Acute on chronic diastolic (congestive) heart failure: Secondary | ICD-10-CM | POA: Diagnosis not present

## 2021-05-01 DIAGNOSIS — E785 Hyperlipidemia, unspecified: Secondary | ICD-10-CM | POA: Diagnosis not present

## 2021-05-01 DIAGNOSIS — I5032 Chronic diastolic (congestive) heart failure: Secondary | ICD-10-CM | POA: Diagnosis not present

## 2021-05-01 DIAGNOSIS — W19XXXD Unspecified fall, subsequent encounter: Secondary | ICD-10-CM | POA: Diagnosis not present

## 2021-05-01 DIAGNOSIS — R1312 Dysphagia, oropharyngeal phase: Secondary | ICD-10-CM | POA: Diagnosis not present

## 2021-05-01 DIAGNOSIS — I10 Essential (primary) hypertension: Secondary | ICD-10-CM | POA: Diagnosis not present

## 2021-05-01 DIAGNOSIS — R1310 Dysphagia, unspecified: Secondary | ICD-10-CM | POA: Diagnosis not present

## 2021-05-01 DIAGNOSIS — Z7401 Bed confinement status: Secondary | ICD-10-CM | POA: Diagnosis not present

## 2021-05-01 DIAGNOSIS — R262 Difficulty in walking, not elsewhere classified: Secondary | ICD-10-CM | POA: Diagnosis not present

## 2021-05-01 DIAGNOSIS — L89102 Pressure ulcer of unspecified part of back, stage 2: Secondary | ICD-10-CM | POA: Diagnosis not present

## 2021-05-01 DIAGNOSIS — N1831 Chronic kidney disease, stage 3a: Secondary | ICD-10-CM | POA: Diagnosis not present

## 2021-05-01 DIAGNOSIS — J690 Pneumonitis due to inhalation of food and vomit: Secondary | ICD-10-CM | POA: Diagnosis not present

## 2021-05-01 DIAGNOSIS — I2510 Atherosclerotic heart disease of native coronary artery without angina pectoris: Secondary | ICD-10-CM | POA: Diagnosis not present

## 2021-05-01 MED ORDER — FOOD THICKENER (SIMPLYTHICK HONEY): 5.0000 | ORAL | Status: AC | PRN

## 2021-05-01 MED ORDER — LEVOFLOXACIN 500 MG PO TABS: 500.0000 mg | ORAL_TABLET | ORAL | 0 refills | Status: AC

## 2021-05-01 MED ORDER — ACETAMINOPHEN 325 MG PO TABS: 650.0000 mg | ORAL_TABLET | ORAL | Status: AC | PRN

## 2021-05-01 NOTE — Progress Notes (Signed)
Occupational Therapy Treatment Patient Details Name: Anna Ewing MRN: [id number] DOB: Jun 30, 1929 Today's Date: 05/01/2021    History of present illness Pt is a 87 y/o female referred to the ER by her PCP for lower extremity edema and SOB. Pt admitted on 04/20/2021 with acute CHF. PMH significant of hypertension, hyperlipidemia, chronic rhinitis hyponatremia, chronic diastolic CHF who was recently hospitalized at [facility] for hyponatremia CHF and discharged on May 31. Chest x-ray showed interval increase in bibasilar atelectasis and mild fluid overload.   OT comments  Pt received seated in recliner with NT present. Pt requires repetition of cues d/t cognitive impairments as well as hearing deficits. Pt perseverating on needing to clean out her mouth, assisted pt with using suction even though pt reporting she still "had stuff in her mouth.' Pt continues to present with impaired balance, cognitive deficits and impaired safety awareness. Pt currently requires MINA +2 for functional mobility with RW and MAX A for LB ADLs. Pt would continue to benefit from skilled occupational therapy while admitted and after d/c to address the below listed limitations in order to improve overall functional mobility and facilitate independence with BADL participation. DC plan remains appropriate, will follow acutely per POC.    Follow Up Recommendations  SNF;Supervision/Assistance - 24 hour    Equipment Recommendations  3 in 1 bedside commode;Other (comment) (RW)    Recommendations for Other Services      Precautions / Restrictions Precautions Precautions: Fall;Other (comment) Precaution Comments: Incontinent of bladder/bowel Restrictions Weight Bearing Restrictions: No       Mobility Bed Mobility Overal bed mobility: Needs Assistance Bed Mobility: Sit to Supine       Sit to supine: Mod assist   General bed mobility comments: assist to elevate BLEs back to bed and to lower trunk down to  supine    Transfers Overall transfer level: Needs assistance Equipment used: Rolling walker (2 wheeled) Transfers: Sit to/from [employer] Sit to Stand: Min assist;+2 physical assistance Stand pivot transfers: Min assist;+2 physical assistance       General transfer comment: cues for hand placement to rise from recliner and MINA  to power up into standing, MINA to pivot to EOB, MAX cues needed for spatial awareness as pt atttempting to sit prematurely.    Balance Overall balance assessment: Needs assistance Sitting-balance support: Feet supported;No upper extremity supported Sitting balance-Leahy Scale: Fair Sitting balance - Comments: mild LOB posteriorly d/t fatigue Postural control: Posterior lean Standing balance support: During functional activity Standing balance-Leahy Scale: Poor Standing balance comment: Requires UE support in standing and Min A for dynamic tasks due to posterior bias.                           ADL either performed or assessed with clinical judgement   ADL Overall ADL's : Needs assistance/impaired                     Lower Body Dressing: Maximal assistance;Sit to/from stand Lower Body Dressing Details (indicate cue type and reason): to don new brief via sit<>stand Toilet Transfer: RW;Minimal Cabin crew Details (indicate cue type and reason): simulated via functional mobility         Functional mobility during ADLs: Minimal assistance;Rolling walker (stand pivot only) General ADL Comments: pt continues to present with impaired cognition, impaired balance and decreased activity tolerance     Vision       Perception  Praxis      Cognition Arousal/Alertness: Awake/alert Behavior During Therapy: WFL for tasks assessed/performed Overall Cognitive Status: No family/caregiver present to determine baseline cognitive functioning                                 General  Comments: Hx of dementia. Requires frequent repetition due to [facility], perseverating on wanting to clean out her mouth although assisted pt with oral care numerous times during session.        Exercises    Shoulder Instructions       General Comments assisted NT with donning new brief and pad    Pertinent Vitals/ Pain       Pain Assessment: No/denies pain Faces Pain Scale: Hurts little more Pain Location: RUE Pain Descriptors / Indicators: Sore Pain Intervention(s): Monitored during session  Home Living                                          Prior Functioning/Environment              Frequency  Min 2X/week        Progress Toward Goals  OT Goals(current goals can now be found in the care plan section)  Progress towards OT goals: Progressing toward goals  Acute Rehab OT Goals Patient Stated Goal: to clean her mouth out OT Goal Formulation: With patient Time For Goal Achievement: 05/07/21 Potential to Achieve Goals: Fair  Plan Discharge plan remains appropriate;Frequency remains appropriate    Co-evaluation                 AM-PAC OT "6 Clicks" Daily Activity     Outcome Measure   Help from another person eating meals?: A Little Help from another person taking care of personal grooming?: A Little Help from another person toileting, which includes using toliet, bedpan, or urinal?: A Lot Help from another person bathing (including washing, rinsing, drying)?: A Lot Help from another person to put on and taking off regular upper body clothing?: A Little Help from another person to put on and taking off regular lower body clothing?: A Lot 6 Click Score: 15    End of Session Equipment Utilized During Treatment: Rolling walker  OT Visit Diagnosis: Unsteadiness on feet (R26.81);Other abnormalities of gait and mobility (R26.89);Muscle weakness (generalized) (M62.81);Other symptoms and signs involving cognitive function   Activity Tolerance  Patient tolerated treatment well   Patient Left in chair;with call bell/phone within reach;with nursing/sitter in room;Other (comment) (NT assisting pt with changing pts gown)   Nurse Communication Mobility status        Time: 1131-1141 OT Time Calculation (min): 10 min  Charges: OT General Charges $OT Visit: 1 Visit OT Treatments $Self Care/Home Management : 8-22 mins  Lenor Derrick., COTA/L Acute Rehabilitation Services [phone]    Barron Schmid 05/01/2021, 12:06 PM

## 2021-05-01 NOTE — Care Management Important Message (Signed)
Important Message  Patient Details  Name: Anna Ewing MRN: [id number] Date of Birth: 09/30/1929   Medicare Important Message Given:  Yes     Renie Ora 05/01/2021, 11:31 AM

## 2021-05-01 NOTE — Progress Notes (Signed)
Physical Therapy Treatment Patient Details Name: Anna Ewing MRN: [id number] DOB: 04-Oct-1929 Today's Date: 05/01/2021    History of Present Illness Pt is a 87 y/o female referred to the ER by her PCP for lower extremity edema and SOB. Pt admitted on 04/20/2021 with acute CHF. PMH significant of hypertension, hyperlipidemia, chronic rhinitis hyponatremia, chronic diastolic CHF who was recently hospitalized at [facility] for hyponatremia CHF and discharged on May 31. Chest x-ray showed interval increase in bibasilar atelectasis and mild fluid overload.    PT Comments    Patient not progressing with mobility today. Perseverating that she did not sleep in the bed last night but stayed in this chair and she does not want to move. Pt incontinent of urine so assisted with peri-care and donning brief. Requires Mod A to stand from chair x2 due to posterior bias. Declined ambulation but able to march in place with Min A and RW. Participated in there ex. Changed foam pad on back and donned lotion due to itchy skin. Pt very HOH but reads lips well. Plans to d/c to SNF today which is appropriate. Will follow.   Follow Up Recommendations  SNF;Supervision for mobility/OOB     Equipment Recommendations  Rolling walker with 5" wheels;3in1 (PT)    Recommendations for Other Services       Precautions / Restrictions Precautions Precautions: Fall;Other (comment) Precaution Comments: Incontinent of bladder/bowel Restrictions Weight Bearing Restrictions: No    Mobility  Bed Mobility               General bed mobility comments: up in chair upon PT arrival.    Transfers Overall transfer level: Needs assistance Equipment used: Rolling walker (2 wheeled) Transfers: Sit to/from Stand Sit to Stand: Mod assist         General transfer comment: Mod A to power to standing with cues for hand placement and anterior weight shift; stood from chair x2. Incontinent of urine upon  standing.  Ambulation/Gait             General Gait Details: Declined any walking, able to march in place with Min A.   Stairs             Wheelchair Mobility    Modified Rankin (Stroke Patients Only)       Balance Overall balance assessment: Needs assistance Sitting-balance support: Feet supported;No upper extremity supported Sitting balance-Leahy Scale: Fair Sitting balance - Comments: Poor sitting tolerance without back support, <30 sec.   Standing balance support: During functional activity Standing balance-Leahy Scale: Poor Standing balance comment: Requires UE support in standing and Min A for dynamic tasks due to posterior bias.                            Cognition Arousal/Alertness: Awake/alert Behavior During Therapy: WFL for tasks assessed/performed Overall Cognitive Status: No family/caregiver present to determine baseline cognitive functioning                                 General Comments: Hx of dementia. Requires frequent repetition due to [facility] but reads lips well. Perseverating that she did not sleep in the bed last night, not sure of accuracy.      Exercises General Exercises - Lower Extremity Long Arc Quad: Both;Seated;AROM;15 reps (bilaterally at same time) Other Exercises Other Exercises: Half bridging x2 in chair to donn brief  General Comments General comments (skin integrity, edema, etc.): Pt reports itchy back so changed foam pad and donned lotion.      Pertinent Vitals/Pain Pain Assessment: Faces Faces Pain Scale: Hurts little more Pain Location: RUE Pain Descriptors / Indicators: Sore Pain Intervention(s): Monitored during session    Home Living                      Prior Function            PT Goals (current goals can now be found in the care plan section) Progress towards PT goals: Not progressing toward goals - comment (due to fatigue, unwillingness)    Frequency    Min  2X/week      PT Plan Current plan remains appropriate    Co-evaluation              AM-PAC PT "6 Clicks" Mobility   Outcome Measure  Help needed turning from your back to your side while in a flat bed without using bedrails?: A Little Help needed moving from lying on your back to sitting on the side of a flat bed without using bedrails?: A Lot Help needed moving to and from a bed to a chair (including a wheelchair)?: A Lot Help needed standing up from a chair using your arms (e.g., wheelchair or bedside chair)?: A Lot Help needed to walk in hospital room?: A Little Help needed climbing 3-5 steps with a railing? : A Lot 6 Click Score: 14    End of Session Equipment Utilized During Treatment: Gait belt Activity Tolerance: Patient limited by fatigue (unwillingness) Patient left: in chair;with call bell/phone within reach;with chair alarm set Nurse Communication: Mobility status PT Visit Diagnosis: Other abnormalities of gait and mobility (R26.89);Muscle weakness (generalized) (M62.81);Difficulty in walking, not elsewhere classified (R26.2)     Time: 4098-1191 PT Time Calculation (min) (ACUTE ONLY): 23 min  Charges:  $Therapeutic Activity: 23-37 mins                     Vale Haven, PT, DPT Acute Rehabilitation Services Pager [phone] Office [phone]      Blake Divine A Lanier Ensign 05/01/2021, 11:31 AM

## 2021-05-01 NOTE — TOC Transition Note (Addendum)
Transition of Care [facility]) - CM/SW Discharge Note   Patient Details  Name: Anna Ewing MRN: [id number] Date of Birth: [birth year]  Transition of Care [facility]) CM/SW Contact:  Jimmy Picket, LCSWA Phone Number: 05/01/2021, 10:41 AM   Clinical Narrative:     Patient will DC to: [facility] Anticipated DC date: 05/01/21 Family notified: Son [name] Transport by: Sharin Mons     Per MD patient ready for DC to [facility]. RN, patient, patient's family, and facility notified of DC. Discharge Summary and FL2 sent to facility. DC packet on chart. PTAR service has been requested.   RN to call report to [phone].  CSW will sign off for now as social work intervention is no longer needed. Please consult [place] again if new needs arise.   Final next level of care: Skilled Nursing Facility Barriers to Discharge: Barriers Resolved   Patient Goals and CMS Choice        Discharge Placement              Patient chooses bed at: [facility] [place] Patient to be transferred to facility by: PTAR Name of family member notified: Son, [name] Patient and family notified of of transfer: 05/01/21  Discharge Plan and Services                                     Social Determinants of Health (SDOH) Interventions     Readmission Risk Interventions No flowsheet data found.   Jimmy Picket, Theresia Majors, [place] Clinical Social Worker [phone]

## 2021-05-01 NOTE — Progress Notes (Signed)
Report given to [facility] all questions answered. Patient d/c per order.

## 2021-05-01 NOTE — Progress Notes (Signed)
Attempt to call report multiple time nobody answer the call.

## 2021-05-01 NOTE — Discharge Summary (Signed)
Physician Discharge Summary  Anna Ewing:[id number] DOB: [birth year] DOA: 04/20/2021  PCP: Margaree Mackintosh, MD  Admit date: 04/20/2021 Discharge date: 05/01/2021  Time spent: 37 minutes  Recommendations for Outpatient Follow-up:  Complete Levaquin for presumed aspiration as per [facility] Needs outpatient goals of care with regards to further therapeutics or whether palliative care should take over if patient fails to thrive or develops further aspiration in the outpatient setting-family is considering DNI DN H in addition to a DNR already instituted Please de-escalate nonessential meds example statin, vitamin D and others to reduce pill burden and polypharmacy Do not aggressively check labs however may require salt tablets as was treated for hypotonic hypovolemic hyponatremia this hospital stay and will need Chem-12 in about 1 week   Discharge Diagnoses:  MAIN problem for hospitalization   Recurrent aspiration pneumonia with severe oropharyngeal dysphagia Decompensated diastolic heart failure with moderate aortic stenosis-poor cardiac cath candidate given comorbidities Hypotonic hypovolemic hyponatremia responsive to salt tablets  Please see below for itemized issues addressed in HOpsital- refer to other progress notes for clarity if needed  Discharge Condition: Overall guarded  Diet recommendation: Dysphagia 1 thick liquids  Filed Weights   04/29/21 0129 04/30/21 0400 05/01/21 0514  Weight: 45.5 kg 45 kg 45.2 kg    History of present illness:  87 year old community dwelling white female HTN HLD HFpEF Prediabetes A1c 6.3 Hospitalization 03/12/2020 intertrochanteric left femoral fracture   Recent hospitalization OSH-DC 04/17/2021-recommended SNF placement however family decided to take patient home Evaluated by PCP 04/20/2021 who referred her to ER Found to have 3+ pitting edema hyponatremia sodium 130 BNP 936 troponin 622-->596 O2 sat on room air 87% improving to 98% Mild  hypokalemia   Cardiology consulted secondary to echo showing severe hypokinesis inferior base-AV valve = moderate AS moderate MR not a candidate for invasive procedures h/o mild dementia and multiple comorbidities  Hospital Course:  Recurring aspiration/severe oropharyngeal dysphagia CXR 6/10 = persistent LLL airspace consolidation concerning for persistent aspiration SLP recommending dysphagia 1 diet--appreciate Dr. Matthias Hughs input 6/12--Dr. Matthias Hughs did not recommend any specific intervention other than PEG tube versus discussion about palliative care and hospice which we have already done Placed on ceftriaxone and Flagyl IV since 6/10-circumscribed course to Levaquin to complete 6/16 See below discussion-family deciding to go to SNF with palliative and will contemplate DNI/DNH in addition to DNR Acute decompensated diastolic heart failure Moderate aortic stenosis and mild mitral regurgitation Demand ischemia per cardiology, not ACS Poor cardiac cath candidate given multiple comorbidities Continue Toprol 12.5 twice Torsemide held 6/9 Hypovolemic hyponatremia Mild rising creatinine with AKI superimposed on CKD 2 Sodium improved to 133 with salt tablets fluid restriction Discontinue salt tablets  Repeat periodic labs do not chase aggressively-if continues to be hyponatremic will need to have a frank discussion with the patient's family (son) who understands issues surrounding patient's care and is considering transitioning to palliative care if patient does not thrive Hyperglycemia without diagnosis of DM TY 2 Not on any meds for diabetes prior to admission Would not aggressively control her sugar given most readings are below 180 HTN Hold amlodipine at this time monitor trends Pressure injury Stage II Turn as possible to prevent further breakdown   Procedures:  Consultations: Cardiology  Discharge Exam: Vitals:   04/30/21 1948 05/01/21 0514  BP: (!) 141/77 131/79  Pulse: (!)  101 92  Resp: 17 16  Temp: 98.7 F (37.1 C) 98.3 F (36.8 C)  SpO2: 98% 98%    Subj on day  of d/c   Patient is awake alert coherent she is coughing at the bedside while she is eating however She is on a dysphagia diet tolerated applesauce this morning is about the Magic cup She is somewhat incoherent at times she knows she is in the hospital but cannot orient otherwise  General Exam on discharge  Awake coherent no distress-she is not oriented x3 however CTA B no rales wheeze rhonchi S1-S2 no murmur no rub no gallop No lower extremity edema Neurologically is intact with no focal deficit psych is euthymic however she is mildly confused  Discharge Instructions   Discharge Instructions     Call MD for:  temperature >100.4   Complete by: As directed    Diet - low sodium heart healthy   Complete by: As directed    Discharge wound care:   Complete by: As directed    Pressure Injury 04/20/21 Vertebral column Mid Stage 2 -  Partial thickness loss of dermis presenting as a shallow open injury with a red, pink wound bed without slough. 0.2 diameter, no depth or drainage. White in color 10 days   Increase activity slowly   Complete by: As directed       Allergies as of 05/01/2021       Reactions   Chocolate Nausea And Vomiting   Amoxicillin Rash        Medication List     STOP taking these medications    amLODipine 5 MG tablet Commonly known as: NORVASC   ergocalciferol 1.25 MG (50000 UT) capsule Commonly known as: Drisdol   ibandronate 150 MG tablet Commonly known as: Boniva   sodium chloride 1 g tablet   Vitamin D 125 MCG (5000 UT) Caps       TAKE these medications    acetaminophen 325 MG tablet Commonly known as: TYLENOL Take 2 tablets (650 mg total) by mouth every 4 (four) hours as needed for headache or mild pain.   fluticasone 50 MCG/ACT nasal spray Commonly known as: Flonase 1 or 2 sprays each nostril, once daily at bedtime   food thickener  Liqd Commonly known as: SIMPLYTHICK (HONEY/LEVEL 3/MODERATELY THICK) Take 5 packets by mouth as needed.   levofloxacin 500 MG tablet Commonly known as: LEVAQUIN Take 1 tablet (500 mg total) by mouth every other day. Start taking on: May 02, 2021   metoprolol tartrate 25 MG tablet Commonly known as: LOPRESSOR Take 12.5 mg by mouth 2 (two) times daily.   rosuvastatin 5 MG tablet Commonly known as: CRESTOR TAKE 1 TABLET BY MOUTH EVERY DAY               Discharge Care Instructions  (From admission, onward)           Start     Ordered   05/01/21 0000  Discharge wound care:       Comments: Pressure Injury 04/20/21 Vertebral column Mid Stage 2 -  Partial thickness loss of dermis presenting as a shallow open injury with a red, pink wound bed without slough. 0.2 diameter, no depth or drainage. White in color 10 days   05/01/21 1047           Allergies  Allergen Reactions   Chocolate Nausea And Vomiting   Amoxicillin Rash    Contact information for after-discharge care     Destination     [facility] .   Service: Skilled Paramedic information: [address] [place] [address] [phone]  The results of significant diagnostics from this hospitalization (including imaging, microbiology, ancillary and laboratory) are listed below for reference.    Significant Diagnostic Studies: DG Chest 2 View  Result Date: 04/20/2021 CLINICAL DATA:  Leg swelling and weakness EXAM: CHEST - 2 VIEW COMPARISON:  04/10/2021 FINDINGS: Progression of bibasilar atelectasis/infiltrate. Progression of small bilateral effusions. Mild vascular congestion without edema. Atherosclerotic aortic arch. ORIF proximal left humerus. Chronic left rib fractures. Apical scarring bilaterally. IMPRESSION: Interval increase in bibasilar atelectasis and effusions which may be due to hypoventilation or mild fluid overload.  Negative for edema. Electronically Signed   By: Marlan Palau M.D.   On: 04/20/2021 15:50   DG CHEST PORT 1 VIEW  Result Date: 04/27/2021 CLINICAL DATA:  Pneumonia. EXAM: PORTABLE CHEST 1 VIEW COMPARISON:  Chest x-ray 04/20/2021 FINDINGS: The cardiac silhouette, mediastinal and hilar contours are stable. There is tortuosity and calcification of the thoracic aorta. Stable right apical pleural thickening. Persistent left lower lobe airspace consolidation. Probable small left effusion also. IMPRESSION: Persistent left lower lobe airspace consolidation and probable small effusion. Electronically Signed   By: Rudie Meyer M.D.   On: 04/27/2021 18:35   DG Swallowing Func-Speech Pathology  Result Date: 04/22/2021 Objective Swallowing Evaluation: Type of Study: MBS-Modified Barium Swallow Study  Patient Details Name: KASIDEE VOISIN MRN: [id number] Date of Birth: Aug 26, 1929 Today's Date: 04/22/2021 Time: SLP Start Time (ACUTE ONLY): 1436 -SLP Stop Time (ACUTE ONLY): 1451 SLP Time Calculation (min) (ACUTE ONLY): 15 min Past Medical History: Past Medical History: Diagnosis Date  Chronic rhinitis   Hyperlipidemia   Hypertension  Past Surgical History: Past Surgical History: Procedure Laterality Date  INTRAMEDULLARY (IM) NAIL INTERTROCHANTERIC Left 03/12/2020  Procedure: INTRAMEDULLARY (IM) NAIL INTERTROCHANTRIC;  Surgeon: Roby Lofts, MD;  Location: [facility] OR;  Service: Orthopedics;  Laterality: Left;  MANDIBLE SURGERY    cosmetic  NASAL SINUS SURGERY    ORIF HUMERUS FRACTURE Left 03/12/2020  Procedure: OPEN REDUCTION INTERNAL FIXATION (ORIF) PROXIMAL HUMERUS FRACTURE;  Surgeon: Roby Lofts, MD;  Location: [facility] OR;  Service: Orthopedics;  Laterality: Left;  VESICOVAGINAL FISTULA CLOSURE W/ TAH  2005 HPI: ALEATHA TAITE is a 87 y.o. female with medical history significant of hypertension, hyperlipidemia, chronic rhinitis hyponatremia, chronic diastolic CHF.  Pt was admitted with acute CHF.  CXR on 04/20/21 reported "Interval  increase in bibasilar atelectasis and effusions which may be due to hypoventilation or mild fluid overload."  Subjective: Pt was pleasant and cooperative Assessment / Plan / Recommendation CHL IP CLINICAL IMPRESSIONS 04/22/2021 Clinical Impression Pt has a moderate pharyngoesophageal dysphagia with limited entrance into the UES. Despite other adequate components of her pharyngeal swallow, she does not achieve full epiglottic inversion as her epiglottis seems to be impeded as it hits her posterior pharyngeal swallow. She has a tight UES with prominent/fullness to CP that significantly reduces UES in terms of opening and duration for opening. Residue remains in the pharynx, increasing in volume as consistencies become thicker. Throat clearing throughout the study was either associated with pharyngeal residue, which pt also tried to spontaneously clear with repeated swallows, or with trace penetration that occurs mostly during the swallow. Throat clearing is largely protective of the airway, and a cue to clear her throat harder does eject any penetrates that she doesn't clear on her own. However, pt did begin to aspirate thin liquids once her pharynx was more filled with residue from purees. Although not cleared, this was at least sensed with an attempt at coughing. Recommend Dys  1 diet and nectar thick liquids for now with use of intermittent throat clearing. MD may wish to further assess anatomy around the level of the UES given impact on swallowing function.  SLP Visit Diagnosis Dysphagia, pharyngoesophageal phase (R13.14) Attention and concentration deficit following -- Frontal lobe and executive function deficit following -- Impact on safety and function Moderate aspiration risk;Risk for inadequate nutrition/hydration   CHL IP TREATMENT RECOMMENDATION 04/22/2021 Treatment Recommendations Therapy as outlined in treatment plan below   Prognosis 04/22/2021 Prognosis for Safe Diet Advancement Fair Barriers to Reach Goals  Other (Comment) Barriers/Prognosis Comment -- CHL IP DIET RECOMMENDATION 04/22/2021 SLP Diet Recommendations Dysphagia 1 (Puree) solids;Nectar thick liquid Liquid Administration via Cup;Straw Medication Administration Crushed with puree Compensations Slow rate;Small sips/bites;Clear throat after each swallow Postural Changes Seated upright at 90 degrees;Remain semi-upright after after feeds/meals (Comment)   CHL IP OTHER RECOMMENDATIONS 04/22/2021 Recommended Consults -- Oral Care Recommendations Oral care BID Other Recommendations Order thickener from pharmacy;Prohibited food (jello, ice cream, thin soups);Remove water pitcher   CHL IP FOLLOW UP RECOMMENDATIONS 04/22/2021 Follow up Recommendations Skilled Nursing facility   [facility] IP FREQUENCY AND DURATION 04/22/2021 Speech Therapy Frequency (ACUTE ONLY) min 2x/week Treatment Duration 2 weeks      CHL IP ORAL PHASE 04/22/2021 Oral Phase WFL Oral - Pudding Teaspoon -- Oral - Pudding Cup -- Oral - Honey Teaspoon -- Oral - Honey Cup -- Oral - Nectar Teaspoon -- Oral - Nectar Cup -- Oral - Nectar Straw -- Oral - Thin Teaspoon -- Oral - Thin Cup -- Oral - Thin Straw -- Oral - Puree -- Oral - Mech Soft -- Oral - Regular -- Oral - Multi-Consistency -- Oral - Pill -- Oral Phase - Comment --  CHL IP PHARYNGEAL PHASE 04/22/2021 Pharyngeal Phase Impaired Pharyngeal- Pudding Teaspoon -- Pharyngeal -- Pharyngeal- Pudding Cup -- Pharyngeal -- Pharyngeal- Honey Teaspoon -- Pharyngeal -- Pharyngeal- Honey Cup -- Pharyngeal -- Pharyngeal- Nectar Teaspoon -- Pharyngeal -- Pharyngeal- Nectar Cup Reduced epiglottic inversion;Pharyngeal residue - pyriform;Penetration/Aspiration during swallow Pharyngeal Material enters airway, remains ABOVE vocal cords then ejected out Pharyngeal- Nectar Straw Reduced epiglottic inversion;Pharyngeal residue - pyriform;Penetration/Aspiration during swallow Pharyngeal Material enters airway, remains ABOVE vocal cords then ejected out Pharyngeal- Thin Teaspoon --  Pharyngeal -- Pharyngeal- Thin Cup Reduced epiglottic inversion;Pharyngeal residue - pyriform;Penetration/Aspiration during swallow Pharyngeal Material enters airway, remains ABOVE vocal cords and not ejected out Pharyngeal- Thin Straw Reduced epiglottic inversion;Pharyngeal residue - pyriform;Penetration/Aspiration during swallow;Penetration/Apiration after swallow Pharyngeal Material enters airway, passes BELOW cords and not ejected out despite cough attempt by patient Pharyngeal- Puree Reduced epiglottic inversion;Pharyngeal residue - pyriform;Pharyngeal residue - valleculae;Compensatory strategies attempted (with notebox) Pharyngeal -- Pharyngeal- Mechanical Soft -- Pharyngeal -- Pharyngeal- Regular -- Pharyngeal -- Pharyngeal- Multi-consistency -- Pharyngeal -- Pharyngeal- Pill -- Pharyngeal -- Pharyngeal Comment --  CHL IP CERVICAL ESOPHAGEAL PHASE 04/22/2021 Cervical Esophageal Phase Impaired Pudding Teaspoon -- Pudding Cup -- Honey Teaspoon -- Honey Cup -- Nectar Teaspoon -- Nectar Cup Reduced cricopharyngeal relaxation;Prominent cricopharyngeal segment Nectar Straw Reduced cricopharyngeal relaxation;Prominent cricopharyngeal segment Thin Teaspoon -- Thin Cup Reduced cricopharyngeal relaxation;Prominent cricopharyngeal segment Thin Straw Reduced cricopharyngeal relaxation;Prominent cricopharyngeal segment Puree Reduced cricopharyngeal relaxation;Prominent cricopharyngeal segment Mechanical Soft -- Regular -- Multi-consistency -- Pill -- Cervical Esophageal Comment -- Mahala Menghini., M.A. CCC-SLP Acute Rehabilitation Services Pager [phone] Office [phone] 04/22/2021, 3:50 PM              ECHOCARDIOGRAM COMPLETE  Result Date: 04/22/2021    ECHOCARDIOGRAM REPORT   Patient Name:   TRUE  L Lenig Date of Exam: 04/22/2021 Medical Rec #:  [id number]      Height:       62.0 in Accession #:    [id number]     Weight:       106.7 lb Date of Birth:  02/11/1929       BSA:          1.464 m Patient Age:    87 years        BP:           140/73 mmHg Patient Gender: F              HR:           86 bpm. Exam Location:  Inpatient Procedure: 2D Echo, Cardiac Doppler and Color Doppler Indications:    CHF-Acute Diastolic I50.31  History:        Patient has prior history of Echocardiogram examinations, most                 recent 03/10/2018. CHF, Signs/Symptoms:Murmur; Risk                 Factors:Hypertension, Dyslipidemia and Former Smoker.  Sonographer:    Celesta Gentile RCS Referring Phys: [phone] MOHAMMAD L GARBA IMPRESSIONS  1. Diastolic annulus velocities indicate normal diastolic function, especially when adjusted for age. Left ventricular ejection fraction, by estimation, is 65 to 70%. The left ventricle has normal function. The left ventricle demonstrates regional wall motion abnormalities (see scoring diagram/findings for description). Left ventricular diastolic parameters were normal.  2. Right ventricular systolic function is normal. The right ventricular size is normal. There is mildly elevated pulmonary artery systolic pressure.  3. The mitral valve is degenerative. Moderate mitral valve regurgitation.  4. Tricuspid valve regurgitation is mild to moderate.  5. The aortic valve is tricuspid. There is severe calcifcation of the aortic valve. There is severe thickening of the aortic valve. Aortic valve regurgitation is not visualized. Moderate aortic valve stenosis. Comparison(s): Prior images unable to be directly viewed, comparison made by report only. The left ventricular wall motion abnormality is is new. Aortic stenosis is unchanged. FINDINGS  Left Ventricle: Diastolic annulus velocities indicate normal diastolic function, especially when adjusted for age. Left ventricular ejection fraction, by estimation, is 65 to 70%. The left ventricle has normal function. The left ventricle demonstrates regional wall motion abnormalities. Severe hypokinesis of the left ventricular, basal inferior wall and inferolateral wall. The left  ventricular internal cavity size was normal in size. There is no left ventricular hypertrophy. Left ventricular diastolic  parameters were normal. Normal left ventricular filling pressure. Right Ventricle: The right ventricular size is normal. No increase in right ventricular wall thickness. Right ventricular systolic function is normal. There is mildly elevated pulmonary artery systolic pressure. The tricuspid regurgitant velocity is 2.88  m/s, and with an assumed right atrial pressure of 3 mmHg, the estimated right ventricular systolic pressure is 36.2 mmHg. Left Atrium: Left atrial size was normal in size. Right Atrium: Right atrial size was normal in size. Prominent Eustachian valve. Pericardium: There is no evidence of pericardial effusion. Mitral Valve: The mitral valve is degenerative in appearance. There is mild thickening of the mitral valve leaflet(s). Mild mitral annular calcification. Moderate mitral valve regurgitation, with centrally-directed jet. Tricuspid Valve: The tricuspid valve is normal in structure. Tricuspid valve regurgitation is mild to moderate. Aortic Valve: The aortic valve is tricuspid. There is severe calcifcation of the aortic valve. There is severe thickening of the aortic valve.  Aortic valve regurgitation is not visualized. Moderate aortic stenosis is present. Aortic valve mean gradient measures 21.7 mmHg. Aortic valve peak gradient measures 38.7 mmHg. Aortic valve area, by VTI measures 1.29 cm. Pulmonic Valve: The pulmonic valve was grossly normal. Pulmonic valve regurgitation is not visualized. Aorta: The aortic root is normal in size and structure. IAS/Shunts: No atrial level shunt detected by color flow Doppler.  LEFT VENTRICLE PLAX 2D LVIDd:         4.00 cm  Diastology LVIDs:         2.20 cm  LV e' medial:    5.44 cm/s LV PW:         0.90 cm  LV E/e' medial:  11.6 LV IVS:        1.05 cm  LV e' lateral:   12.60 cm/s LVOT diam:     1.94 cm  LV E/e' lateral: 5.0 LV SV:         78  LV SV Index:   53 LVOT Area:     2.96 cm  RIGHT VENTRICLE RV S prime:     12.60 cm/s TAPSE (M-mode): 2.1 cm LEFT ATRIUM             Index       RIGHT ATRIUM           Index LA diam:        3.10 cm 2.12 cm/m  RA Area:     16.50 cm LA Vol (A2C):   35.3 ml 24.12 ml/m RA Volume:   42.70 ml  29.17 ml/m LA Vol (A4C):   44.0 ml 30.06 ml/m LA Biplane Vol: 43.0 ml 29.38 ml/m  AORTIC VALVE AV Area (Vmax):    1.11 cm AV Area (Vmean):   1.17 cm AV Area (VTI):     1.29 cm AV Vmax:           311.00 cm/s AV Vmean:          215.333 cm/s AV VTI:            0.603 m AV Peak Grad:      38.7 mmHg AV Mean Grad:      21.7 mmHg LVOT Vmax:         117.00 cm/s LVOT Vmean:        85.100 cm/s LVOT VTI:          0.263 m LVOT/AV VTI ratio: 0.44  AORTA Ao Root diam: 3.30 cm MITRAL VALVE                TRICUSPID VALVE MV Area (PHT): 2.66 cm     TR Peak grad:   33.2 mmHg MV Decel Time: 285 msec     TR Vmax:        288.00 cm/s MR Peak grad: 141.1 mmHg MR Mean grad: 97.0 mmHg     SHUNTS MR Vmax:      594.00 cm/s   Systemic VTI:  0.26 m MR Vmean:     475.0 cm/s    Systemic Diam: 1.94 cm MV E velocity: 63.10 cm/s MV A velocity: 115.00 cm/s MV E/A ratio:  0.55 Mihai Croitoru MD Electronically signed by Thurmon FairMihai Croitoru MD Signature Date/Time: 04/22/2021/3:13:43 PM    Final     Microbiology: Recent Results (from the past 240 hour(s))  SARS CORONAVIRUS 2 (TAT 6-24 HRS) Nasopharyngeal Nasopharyngeal Swab     Status: None   Collection Time: 04/27/21  5:36 AM   Specimen: Nasopharyngeal Swab  Result Value  Ref Range Status   SARS Coronavirus 2 NEGATIVE NEGATIVE Final    Comment: (NOTE) SARS-CoV-2 target nucleic acids are NOT DETECTED.  The SARS-CoV-2 RNA is generally detectable in upper and lower respiratory specimens during the acute phase of infection. Negative results do not preclude SARS-CoV-2 infection, do not rule out co-infections with other pathogens, and should not be used as the sole basis for treatment or other patient  management decisions. Negative results must be combined with clinical observations, patient history, and epidemiological information. The expected result is Negative.  Fact Sheet for Patients: HairSlick.no  Fact Sheet for Healthcare Providers: quierodirigir.com  This test is not yet approved or cleared by the [place] FDA and  has been authorized for detection and/or diagnosis of SARS-CoV-2 by FDA under an Emergency Use Authorization (EUA). This EUA will remain  in effect (meaning this test can be used) for the duration of the COVID-19 declaration under Se ction 564(b)(1) of the Act, 21 U.S.C. section 360bbb-3(b)(1), unless the authorization is terminated or revoked sooner.  Performed at [facility], [address] [place], [place] [address]   SARS CORONAVIRUS 2 (TAT 6-24 HRS) Nasopharyngeal Nasopharyngeal Swab     Status: None   Collection Time: 04/30/21  3:43 PM   Specimen: Nasopharyngeal Swab  Result Value Ref Range Status   SARS Coronavirus 2 NEGATIVE NEGATIVE Final    Comment: (NOTE) SARS-CoV-2 target nucleic acids are NOT DETECTED.  The SARS-CoV-2 RNA is generally detectable in upper and lower respiratory specimens during the acute phase of infection. Negative results do not preclude SARS-CoV-2 infection, do not rule out co-infections with other pathogens, and should not be used as the sole basis for treatment or other patient management decisions. Negative results must be combined with clinical observations, patient history, and epidemiological information. The expected result is Negative.  Fact Sheet for Patients: HairSlick.no  Fact Sheet for Healthcare Providers: quierodirigir.com  This test is not yet approved or cleared by the [place] FDA and  has been authorized for detection and/or diagnosis of SARS-CoV-2 by FDA under an Emergency Use  Authorization (EUA). This EUA will remain  in effect (meaning this test can be used) for the duration of the COVID-19 declaration under Se ction 564(b)(1) of the Act, 21 U.S.C. section 360bbb-3(b)(1), unless the authorization is terminated or revoked sooner.  Performed at [facility], [address], [place], [place] [address]      Labs: Basic Metabolic Panel: Recent Labs  Lab 04/26/21 1832 04/27/21 0330 04/28/21 0324 04/29/21 0237 04/30/21 0150  NA 127* 127* 128* 131* 133*  K 4.0 4.2 4.1 3.8 4.1  CL 91* 93* 93* 97* 99  CO2 26 24 24 25 23   GLUCOSE 165* 115* 124* 118* 128*  BUN 33* 32* 37* 39* 37*  CREATININE 1.64* 1.54* 1.64* 1.75* 1.80*  CALCIUM 8.6* 8.6* 8.4* 8.4* 8.6*  PHOS  --  3.5 3.1 3.9  --    Liver Function Tests: Recent Labs  Lab 04/27/21 0330 04/28/21 0324 04/29/21 0237  ALBUMIN 2.2* 1.9* 2.0*   No results for input(s): LIPASE, AMYLASE in the last 168 hours. No results for input(s): AMMONIA in the last 168 hours. CBC: Recent Labs  Lab 04/27/21 0330 04/28/21 0324 04/29/21 0237 04/30/21 0150  WBC 12.2* 11.1* 10.7* 12.3*  NEUTROABS 9.0* 7.7 7.8* 9.2*  HGB 8.6* 7.2* 7.5* 8.8*  HCT 25.7* 21.8* 23.2* 26.7*  MCV 86.2 85.2 86.9 85.9  PLT 437* 402* 467* 454*   Cardiac  Enzymes: No results for input(s): CKTOTAL, CKMB, CKMBINDEX, TROPONINI in the last 168 hours. BNP: BNP (last 3 results) Recent Labs    04/20/21 1600  BNP 936.1*    ProBNP (last 3 results) No results for input(s): PROBNP in the last 8760 hours.  CBG: No results for input(s): GLUCAP in the last 168 hours.     Signed:  Rhetta Mura MD   [facility] 05/01/2021, 10:48 AM

## 2021-05-02 DIAGNOSIS — R1310 Dysphagia, unspecified: Secondary | ICD-10-CM | POA: Diagnosis not present

## 2021-05-02 DIAGNOSIS — N1831 Chronic kidney disease, stage 3a: Secondary | ICD-10-CM | POA: Diagnosis not present

## 2021-05-02 DIAGNOSIS — I5032 Chronic diastolic (congestive) heart failure: Secondary | ICD-10-CM | POA: Diagnosis not present

## 2021-05-02 DIAGNOSIS — R262 Difficulty in walking, not elsewhere classified: Secondary | ICD-10-CM | POA: Diagnosis not present

## 2021-05-08 DIAGNOSIS — L89102 Pressure ulcer of unspecified part of back, stage 2: Secondary | ICD-10-CM | POA: Diagnosis not present

## 2021-05-15 DIAGNOSIS — L89102 Pressure ulcer of unspecified part of back, stage 2: Secondary | ICD-10-CM | POA: Diagnosis not present

## 2021-05-18 DEATH — deceased

## 2021-06-13 IMAGING — CT CT CERVICAL SPINE W/O CM
3 of 4 series · 12 of 33 positions shown, 14 images · non-contrast
Comparison: MRI brain 07/16/2017

CLINICAL DATA: Fell. Hit head.

EXAM:
CT HEAD WITHOUT CONTRAST
CT CERVICAL SPINE WITHOUT CONTRAST
TECHNIQUE: Multidetector CT imaging of the head and cervical spine was
performed following the standard protocol without intravenous
contrast. Multiplanar CT image reconstructions of the cervical spine
were also generated.

[Series 4: c_spine 2.0 st · axial · 0.42mm/px · z∈[-254,-138]mm · 4 of 81 slices shown, 5 images]
[im 14/81  soft-tissue]
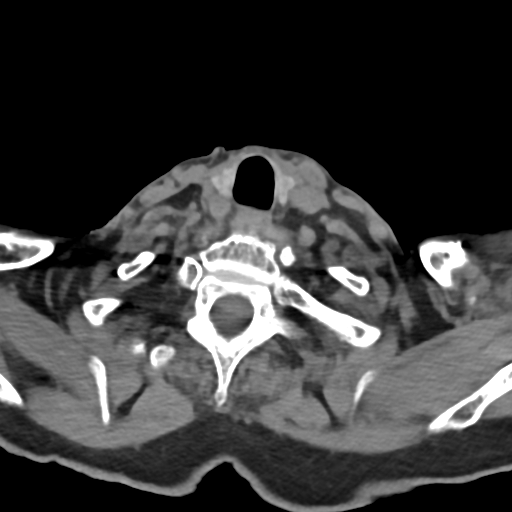
[im 14/81  bone]
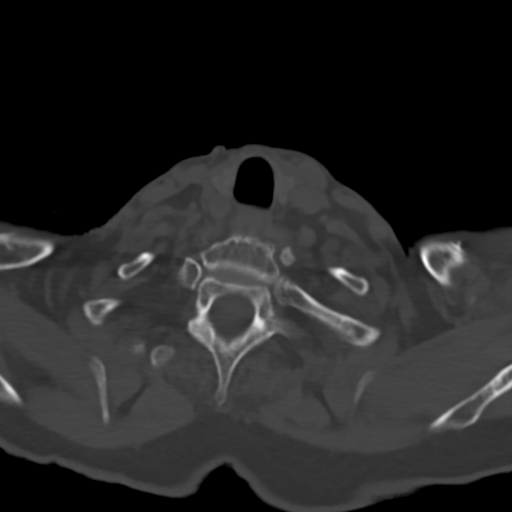
[im 27/81  bone]
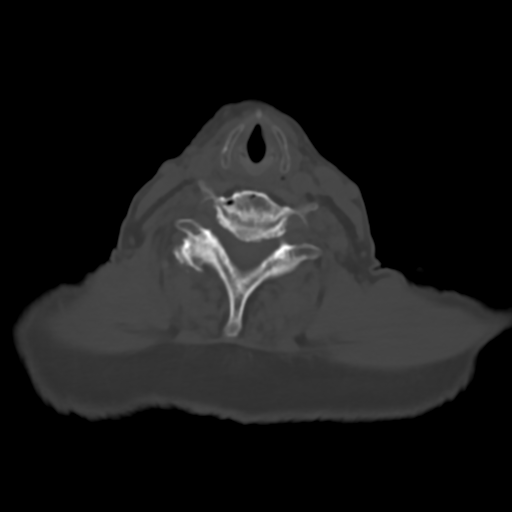
[im 54/81  bone]
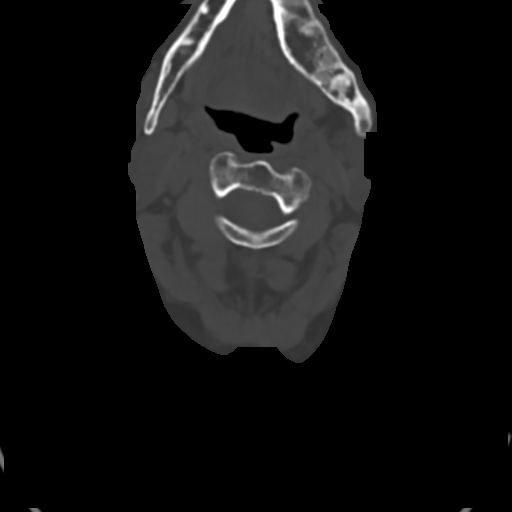
[im 67/81  bone]
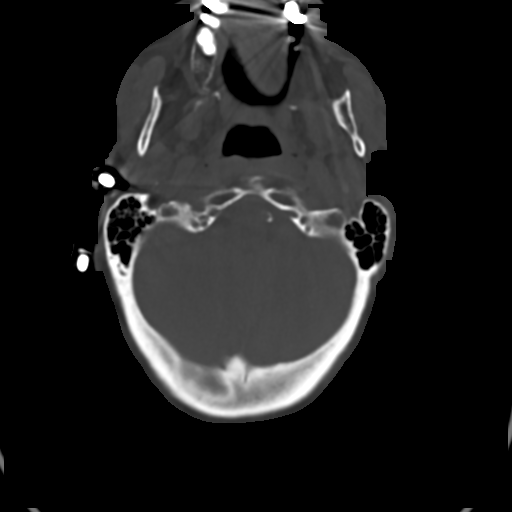

[Series 6: c_spine 2.0 sag bone · sagittal · 0.27mm/px · 5 of 51 slices shown, 6 images]
[im 17/51  bone]
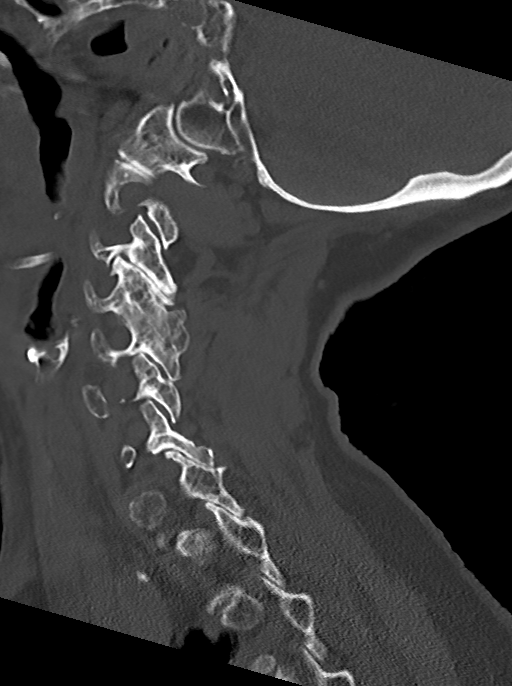
[im 21/51  bone]
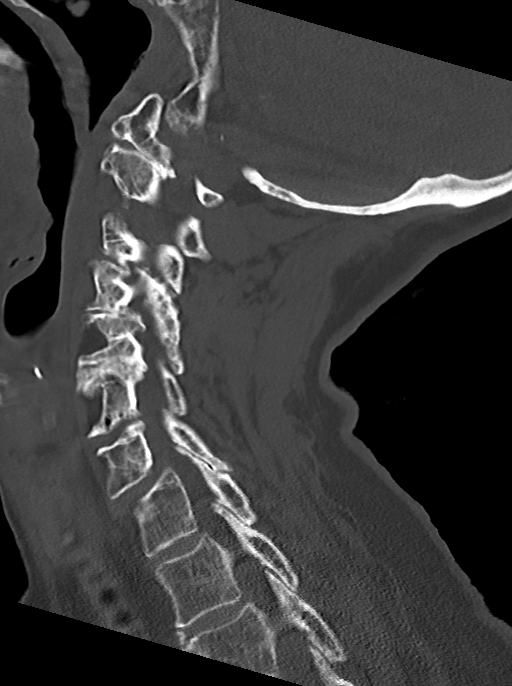
[im 26/51  soft-tissue]
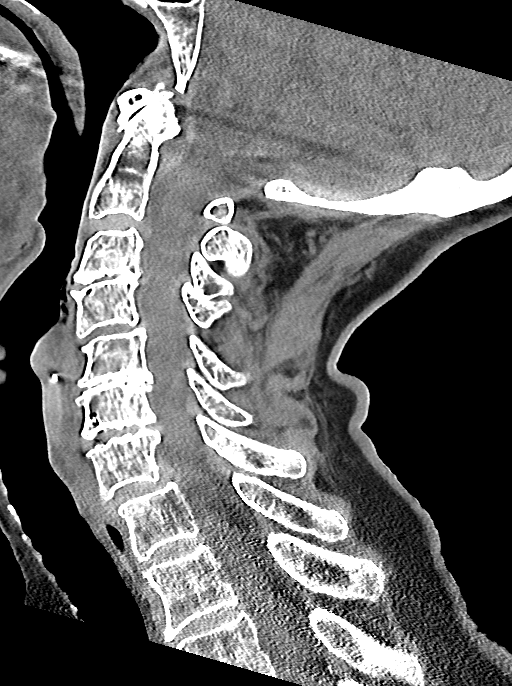
[im 26/51  bone]
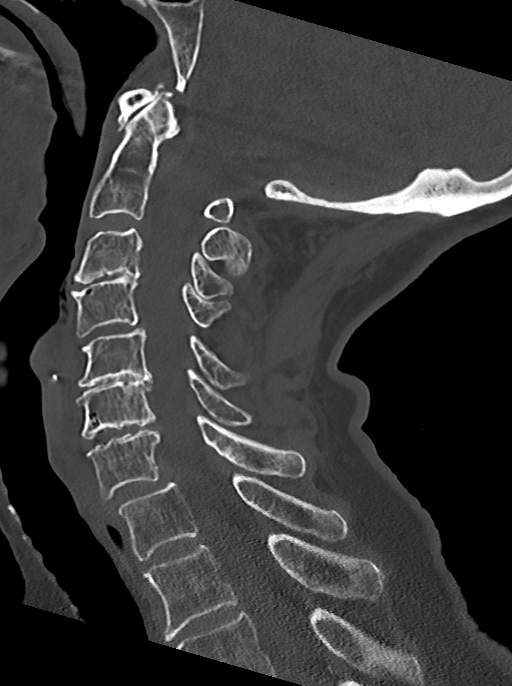
[im 30/51  bone]
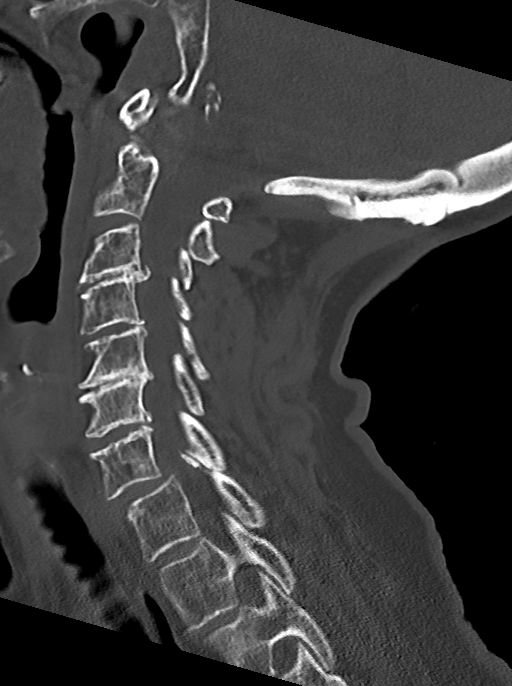
[im 34/51  bone]
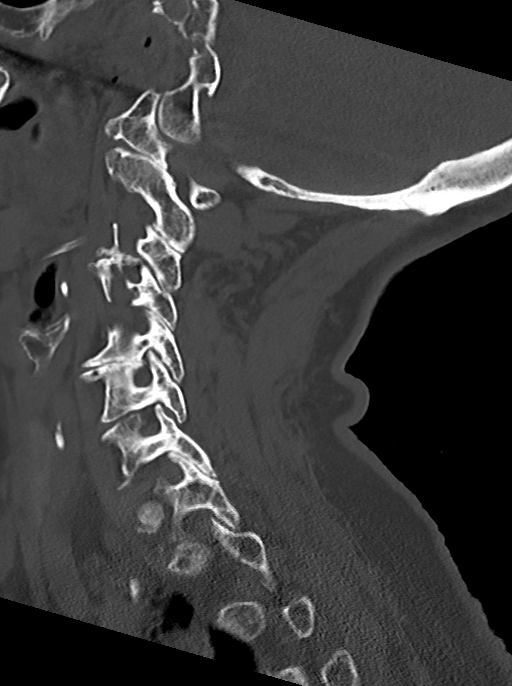

[Series 7: c_spine 2.0 cor bone · coronal · 0.21mm/px · 3 of 67 slices shown]
[im 14/67  bone]
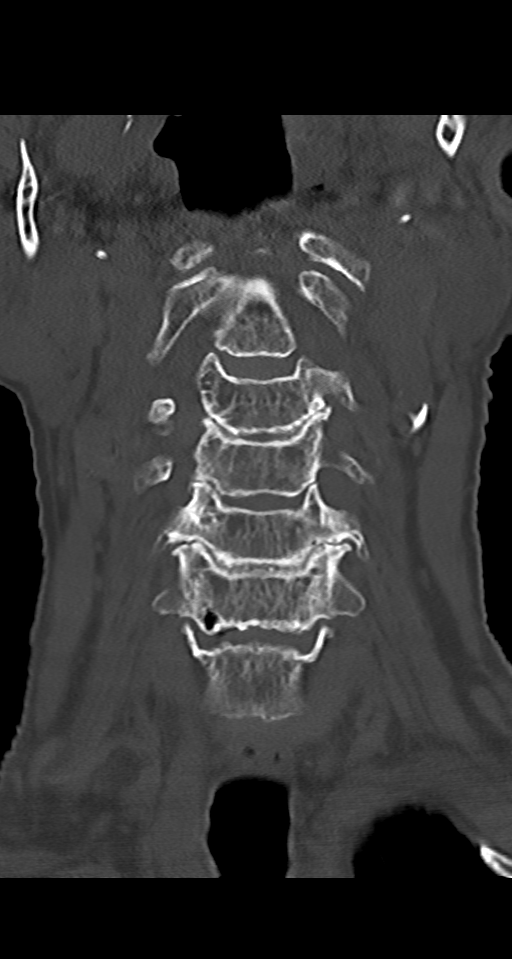
[im 27/67  bone]
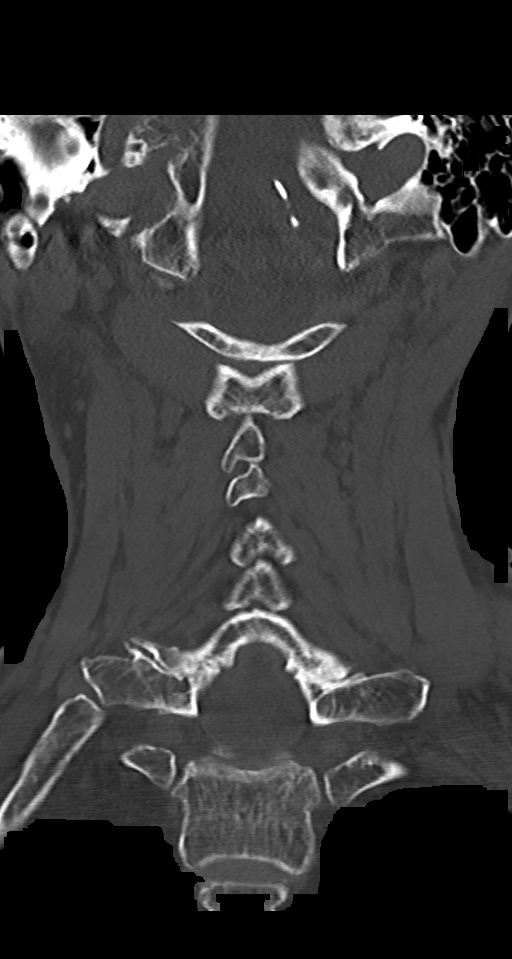
[im 40/67  bone]
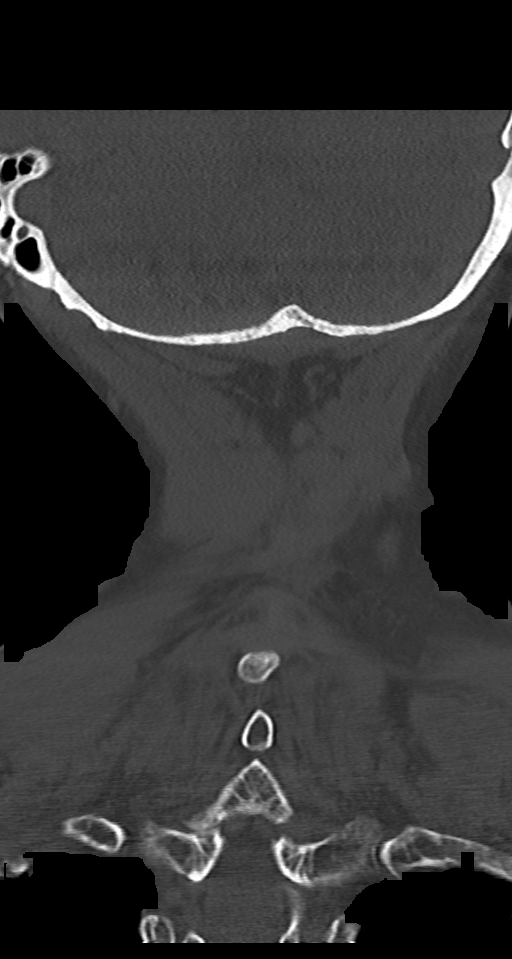

[12 of 33 positions shown; findings below may reference images not displayed]

FINDINGS: CT HEAD FINDINGS

Brain: Stable age related cerebral atrophy, ventriculomegaly and
periventricular white matter disease. No extra-axial fluid
collections are identified. No CT findings for acute hemispheric
infarction or intracranial hemorrhage. No mass lesions. The
brainstem and cerebellum are normal.

Vascular: Vascular calcifications but no definite aneurysm or
hyperdense vessels.

Skull: No skull fracture or bone lesion.

Sinuses/Orbits: The paranasal sinuses and mastoid air cells are
clear. The globes are intact.

Other: No scalp lesions or hematoma.

CT CERVICAL SPINE FINDINGS

Alignment: Normal overall alignment of the cervical vertebral
bodies. Mild multilevel degenerative subluxations are noted due to
advanced degenerative disc disease and facet disease.

Skull base and vertebrae: No acute fractures are identified.
Advanced facet disease noted with fused facet joints on the right at
C4-5.

Soft tissues and spinal canal: No prevertebral fluid or swelling. No
visible canal hematoma.

Disc levels: The spinal canal is fairly generous. No large disc
protrusions or significant spinal stenosis. Mild multilevel
foraminal stenosis due to uncinate spurring and facet disease.

Upper chest: The lung apices are grossly clear. Biapical pleural and
parenchymal scarring changes with calcifications are noted.

Other: Bilateral carotid artery calcifications are noted.
IMPRESSION: 1. Stable age related cerebral atrophy, ventriculomegaly and
periventricular white matter disease.
2. No acute intracranial findings or skull fracture.
3. Advanced degenerative cervical disc disease and facet disease but
no acute cervical spine fracture.

## 2021-07-30 NOTE — Telephone Encounter (Signed)
This encounter was created in error - please disregard.
